# Patient Record
Sex: Male | Born: 1955 | Race: White | Hispanic: No | Marital: Married | Smoking: Never smoker
Health system: Southern US, Community
[De-identification: ages and names within clinical notes are randomized; demographics above are authoritative.]

## PROBLEM LIST (undated history)

## (undated) DIAGNOSIS — C801 Malignant (primary) neoplasm, unspecified: Secondary | ICD-10-CM

## (undated) DIAGNOSIS — M1990 Unspecified osteoarthritis, unspecified site: Secondary | ICD-10-CM

## (undated) DIAGNOSIS — E785 Hyperlipidemia, unspecified: Secondary | ICD-10-CM

## (undated) DIAGNOSIS — M545 Low back pain, unspecified: Secondary | ICD-10-CM

## (undated) DIAGNOSIS — I10 Essential (primary) hypertension: Secondary | ICD-10-CM

## (undated) HISTORY — PX: OTHER SURGICAL HISTORY: SHX169

## (undated) HISTORY — PX: MOHS SURGERY: SUR867

## (undated) HISTORY — DX: Low back pain, unspecified: M54.50

## (undated) HISTORY — PX: COLONOSCOPY: SHX174

## (undated) HISTORY — DX: Malignant (primary) neoplasm, unspecified: C80.1

## (undated) HISTORY — PX: INJECTION KNEE: SHX2446

## (undated) HISTORY — DX: Hyperlipidemia, unspecified: E78.5

## (undated) HISTORY — PX: POLYPECTOMY: SHX149

## (undated) HISTORY — PX: HERNIA REPAIR: SHX51

## (undated) HISTORY — PX: LUMBAR FUSION: SHX111

## (undated) HISTORY — DX: Unspecified osteoarthritis, unspecified site: M19.90

## (undated) HISTORY — DX: Low back pain: M54.5

## (undated) HISTORY — DX: Essential (primary) hypertension: I10

## (undated) HISTORY — PX: VASECTOMY: SHX75

---

## 1998-04-07 ENCOUNTER — Ambulatory Visit (HOSPITAL_BASED_OUTPATIENT_CLINIC_OR_DEPARTMENT_OTHER): Admission: RE | Admit: 1998-04-07 | Discharge: 1998-04-07 | Payer: Self-pay | Admitting: Orthopedic Surgery

## 2001-02-11 ENCOUNTER — Encounter (INDEPENDENT_AMBULATORY_CARE_PROVIDER_SITE_OTHER): Payer: Self-pay | Admitting: Specialist

## 2001-02-11 ENCOUNTER — Other Ambulatory Visit: Admission: RE | Admit: 2001-02-11 | Discharge: 2001-02-11 | Payer: Self-pay | Admitting: Urology

## 2004-06-16 ENCOUNTER — Ambulatory Visit: Payer: Self-pay | Admitting: Internal Medicine

## 2004-06-21 ENCOUNTER — Ambulatory Visit: Payer: Self-pay | Admitting: Internal Medicine

## 2004-08-30 ENCOUNTER — Ambulatory Visit: Payer: Self-pay | Admitting: Internal Medicine

## 2004-09-08 ENCOUNTER — Ambulatory Visit: Payer: Self-pay | Admitting: Internal Medicine

## 2005-03-09 ENCOUNTER — Ambulatory Visit: Payer: Self-pay | Admitting: Internal Medicine

## 2005-04-06 ENCOUNTER — Ambulatory Visit: Payer: Self-pay | Admitting: Internal Medicine

## 2005-04-19 ENCOUNTER — Encounter: Admission: RE | Admit: 2005-04-19 | Discharge: 2005-04-19 | Payer: Self-pay | Admitting: Neurosurgery

## 2005-05-23 ENCOUNTER — Inpatient Hospital Stay (HOSPITAL_COMMUNITY): Admission: RE | Admit: 2005-05-23 | Discharge: 2005-05-28 | Payer: Self-pay | Admitting: Neurosurgery

## 2005-06-29 ENCOUNTER — Encounter: Admission: RE | Admit: 2005-06-29 | Discharge: 2005-06-29 | Payer: Self-pay | Admitting: Neurosurgery

## 2005-07-06 ENCOUNTER — Ambulatory Visit: Payer: Self-pay | Admitting: Internal Medicine

## 2005-08-30 ENCOUNTER — Encounter
Admission: RE | Admit: 2005-08-30 | Discharge: 2005-08-30 | Payer: Self-pay | Admitting: Physical Medicine and Rehabilitation

## 2005-09-20 ENCOUNTER — Ambulatory Visit: Payer: Self-pay | Admitting: Internal Medicine

## 2005-10-25 ENCOUNTER — Ambulatory Visit: Payer: Self-pay | Admitting: Internal Medicine

## 2006-01-04 ENCOUNTER — Ambulatory Visit: Payer: Self-pay | Admitting: Internal Medicine

## 2006-04-13 ENCOUNTER — Ambulatory Visit: Payer: Self-pay | Admitting: Internal Medicine

## 2006-04-20 ENCOUNTER — Ambulatory Visit: Payer: Self-pay | Admitting: Internal Medicine

## 2006-08-21 ENCOUNTER — Ambulatory Visit: Payer: Self-pay | Admitting: Internal Medicine

## 2006-08-21 LAB — CONVERTED CEMR LAB
ALT: 69 units/L — ABNORMAL HIGH (ref 0–40)
AST: 36 units/L (ref 0–37)
Albumin: 3.6 g/dL (ref 3.5–5.2)
Alkaline Phosphatase: 33 units/L — ABNORMAL LOW (ref 39–117)
BUN: 18 mg/dL (ref 6–23)
Basophils Absolute: 0 10*3/uL (ref 0.0–0.1)
Basophils Relative: 0.4 % (ref 0.0–1.0)
CO2: 30 meq/L (ref 19–32)
Calcium: 9.3 mg/dL (ref 8.4–10.5)
Chloride: 107 meq/L (ref 96–112)
Cholesterol: 206 mg/dL (ref 0–200)
Creatinine, Ser: 1.2 mg/dL (ref 0.4–1.5)
Direct LDL: 110 mg/dL
Eosinophils Absolute: 0.1 10*3/uL (ref 0.0–0.6)
Eosinophils Relative: 2.5 % (ref 0.0–5.0)
GFR calc Af Amer: 83 mL/min
GFR calc non Af Amer: 68 mL/min
Glucose, Bld: 111 mg/dL — ABNORMAL HIGH (ref 70–99)
HCT: 42.8 % (ref 39.0–52.0)
HDL: 44.4 mg/dL (ref >39.0)
Hemoglobin: 15.1 g/dL (ref 13.0–17.0)
Hgb A1c MFr Bld: 5.9 % (ref 4.6–6.0)
Lymphocytes Relative: 26.5 % (ref 12.0–46.0)
MCHC: 35.4 g/dL (ref 30.0–36.0)
MCV: 91.2 fL (ref 78.0–100.0)
Monocytes Absolute: 0.6 10*3/uL (ref 0.2–0.7)
Monocytes Relative: 10.2 % (ref 3.0–11.0)
Neutro Abs: 3.3 10*3/uL (ref 1.4–7.7)
Neutrophils Relative %: 60.4 % (ref 43.0–77.0)
PSA: 1.13 ng/mL (ref 0.10–4.00)
Platelets: 201 10*3/uL (ref 150–400)
Potassium: 4.9 meq/L (ref 3.5–5.1)
RBC: 4.69 M/uL (ref 4.22–5.81)
RDW: 12.5 % (ref 11.5–14.6)
Sodium: 141 meq/L (ref 135–145)
TSH: 5.16 microintl units/mL (ref 0.35–5.50)
Total Bilirubin: 1.3 mg/dL — ABNORMAL HIGH (ref 0.3–1.2)
Total CHOL/HDL Ratio: 4.6
Total Protein: 6.1 g/dL (ref 6.0–8.3)
Triglycerides: 229 mg/dL (ref 0–149)
VLDL: 46 mg/dL — ABNORMAL HIGH (ref 0–40)
WBC: 5.4 10*3/uL (ref 4.5–10.5)

## 2006-08-29 ENCOUNTER — Ambulatory Visit: Payer: Self-pay | Admitting: Internal Medicine

## 2006-11-28 ENCOUNTER — Ambulatory Visit: Payer: Self-pay | Admitting: Internal Medicine

## 2006-11-28 LAB — CONVERTED CEMR LAB
ALT: 86 units/L — ABNORMAL HIGH (ref 0–40)
AST: 48 units/L — ABNORMAL HIGH (ref 0–37)
Albumin: 3.9 g/dL (ref 3.5–5.2)
Alkaline Phosphatase: 40 units/L (ref 39–117)
Bilirubin, Direct: 0.1 mg/dL (ref 0.0–0.3)
Cholesterol: 238 mg/dL (ref 0–200)
Direct LDL: 127.4 mg/dL
HDL: 44 mg/dL (ref >39.0)
Total Bilirubin: 1 mg/dL (ref 0.3–1.2)
Total CHOL/HDL Ratio: 5.4
Total Protein: 6.9 g/dL (ref 6.0–8.3)
Triglycerides: 254 mg/dL (ref 0–149)
VLDL: 51 mg/dL — ABNORMAL HIGH (ref 0–40)

## 2006-12-06 ENCOUNTER — Ambulatory Visit: Payer: Self-pay | Admitting: Internal Medicine

## 2007-03-05 DIAGNOSIS — E785 Hyperlipidemia, unspecified: Secondary | ICD-10-CM | POA: Insufficient documentation

## 2007-03-12 ENCOUNTER — Ambulatory Visit: Payer: Self-pay | Admitting: Internal Medicine

## 2007-03-12 LAB — CONVERTED CEMR LAB
ALT: 39 U/L
AST: 25 U/L
Albumin: 3.8 g/dL
Alkaline Phosphatase: 37 U/L — ABNORMAL LOW
Bilirubin, Direct: 0.2 mg/dL
Cholesterol: 214 mg/dL
Direct LDL: 133 mg/dL
HDL: 43.8 mg/dL
Total Bilirubin: 1.1 mg/dL
Total CHOL/HDL Ratio: 4.9
Total Protein: 6.1 g/dL
Triglycerides: 169 mg/dL — ABNORMAL HIGH
VLDL: 34 mg/dL

## 2007-03-21 ENCOUNTER — Ambulatory Visit: Payer: Self-pay | Admitting: Internal Medicine

## 2007-03-21 DIAGNOSIS — I10 Essential (primary) hypertension: Secondary | ICD-10-CM | POA: Insufficient documentation

## 2007-03-21 DIAGNOSIS — M545 Low back pain, unspecified: Secondary | ICD-10-CM | POA: Insufficient documentation

## 2007-07-17 IMAGING — CT CT L SPINE W/O CM
2 of 6 series · 6 of 20 positions shown, 8 images · IV contrast (agent unspecified)
Comparison: none

CLINICAL DATA: Lumbar spondylosis.  Intermittent back pain.  No recent trauma.
CT LUMBAR SPINE WITHOUT CONTRAST:
Multidetector helical scans through the lumbar spine were performed in the axial plane.  In addition to the axial images, sagittal and coronal images were reconstructed and reviewed. 
At L1-2, there is a small anterior osteophyte present.  There is no evidence of central or foraminal narrowing at this level.
At L2-3, minimal annular bulging is seen and there is mild degenerative change involving the facet joints.
At L3-4, again minimal annular bulging and mild facet joint arthropathy is noted.
At L4-5, there is mild central and lateral stenosis which is multifactorial resulting from annular bulging as well as facet joint arthropathy and hypertrophy of the ligamentum flavum.
At L5-S1, there is degenerative disk disease with loss of disk space and spurring with mild sclerosis.  Minimal annular bulging is present.  There is some foraminal narrowing at L5-S1 bilaterally which is also multifactorial.

[Series 102: l-spine helical · axial · 0.27mm/px · z∈[-309,-158]mm · 5 of 380 slices shown, 7 images]
[im 64/380  soft-tissue]
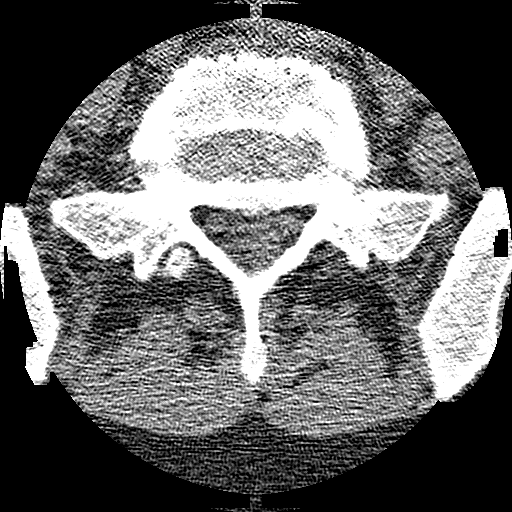
[im 64/380  bone]
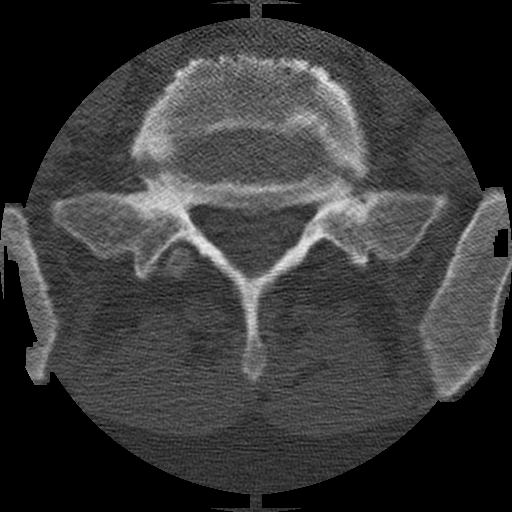
[im 127/380  bone]
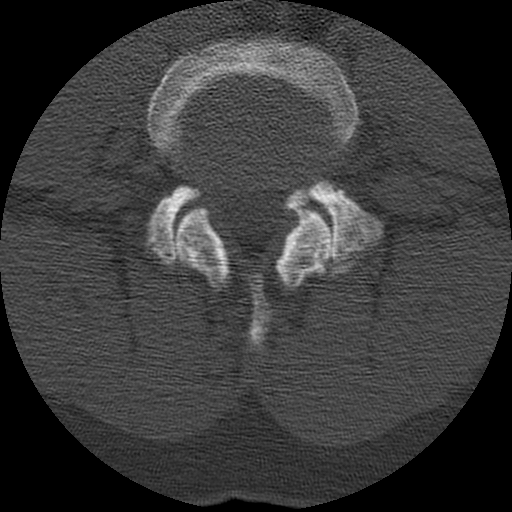
[im 190/380  bone]
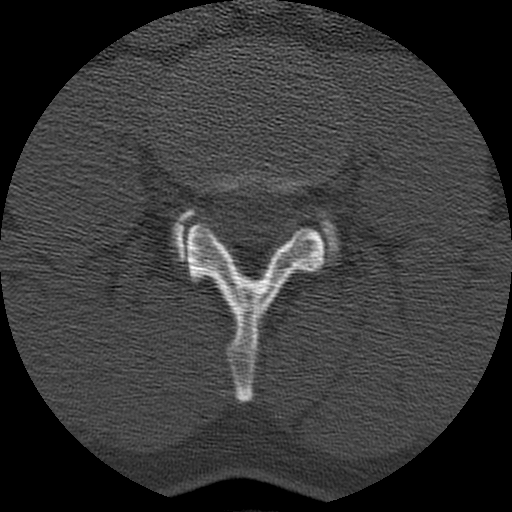
[im 253/380  bone]
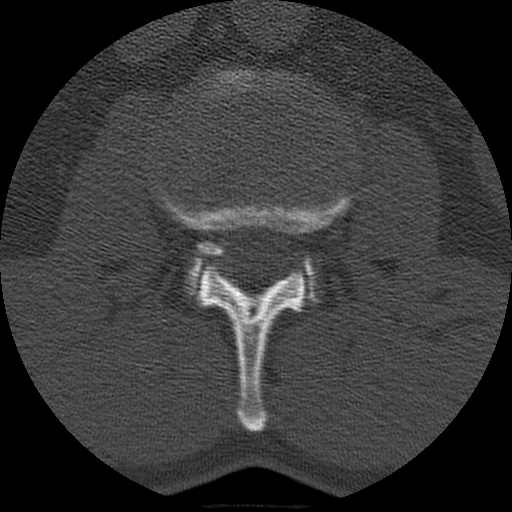
[im 316/380  soft-tissue]
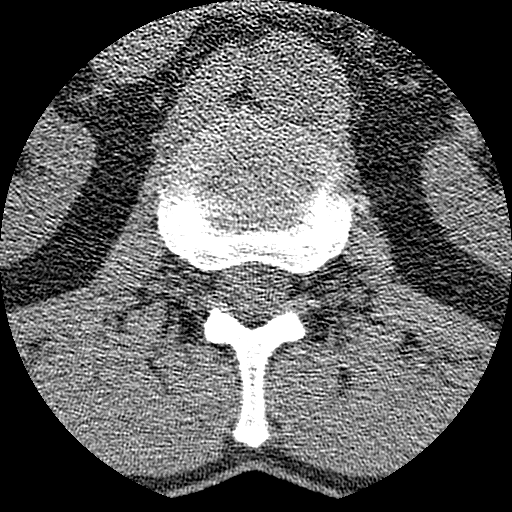
[im 316/380  bone]
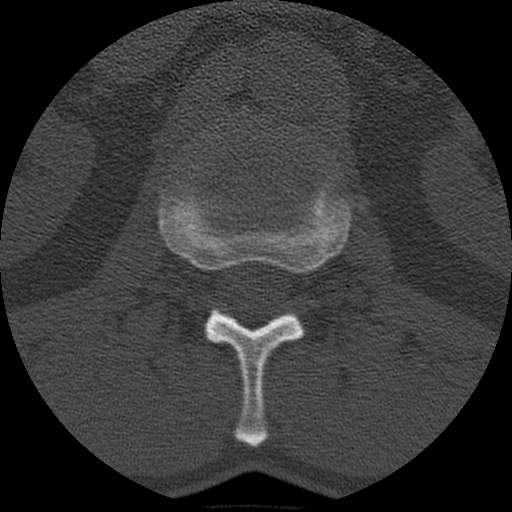

[Series 268: reformatted · sagittal · 0.45mm/px · 1 of 40 slices shown]
[im 20/40  bone]
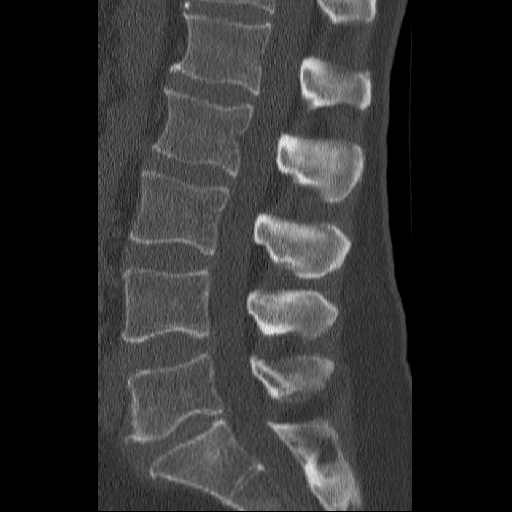

[6 of 20 positions shown; findings below may reference images not displayed]

IMPRESSION: 1.  Mild central and lateral recess stenosis at L4-5 which is multifactorial.
2.  Degenerative disk disease at L5-S1 with bilateral foraminal narrowing as well.
3.  Diffuse degenerative change involving the facet joints.
4.  Normal alignment.

## 2007-08-05 ENCOUNTER — Telehealth: Payer: Self-pay | Admitting: Internal Medicine

## 2007-08-26 ENCOUNTER — Encounter: Payer: Self-pay | Admitting: Internal Medicine

## 2008-04-13 ENCOUNTER — Telehealth: Payer: Self-pay | Admitting: Internal Medicine

## 2008-05-14 ENCOUNTER — Ambulatory Visit: Payer: Self-pay | Admitting: Internal Medicine

## 2008-05-14 LAB — CONVERTED CEMR LAB
ALT: 61 units/L — ABNORMAL HIGH (ref 0–53)
AST: 35 units/L (ref 0–37)
Albumin: 3.9 g/dL (ref 3.5–5.2)
Alkaline Phosphatase: 36 units/L — ABNORMAL LOW (ref 39–117)
BUN: 18 mg/dL (ref 6–23)
Basophils Absolute: 0 10*3/uL (ref 0.0–0.1)
Basophils Relative: 0.2 % (ref 0.0–3.0)
Bilirubin Urine: NEGATIVE
Bilirubin, Direct: 0.1 mg/dL (ref 0.0–0.3)
CO2: 31 meq/L (ref 19–32)
Calcium: 9.5 mg/dL (ref 8.4–10.5)
Chloride: 104 meq/L (ref 96–112)
Cholesterol: 222 mg/dL (ref 0–200)
Creatinine, Ser: 1 mg/dL (ref 0.4–1.5)
Direct LDL: 137.4 mg/dL
Eosinophils Absolute: 0.1 10*3/uL (ref 0.0–0.7)
Eosinophils Relative: 1.6 % (ref 0.0–5.0)
GFR calc Af Amer: 101 mL/min
GFR calc non Af Amer: 84 mL/min
Glucose, Bld: 109 mg/dL — ABNORMAL HIGH (ref 70–99)
Glucose, Urine, Semiquant: NEGATIVE
HCT: 46.2 % (ref 39.0–52.0)
HDL: 42.9 mg/dL (ref >39.0)
Hemoglobin: 16.1 g/dL (ref 13.0–17.0)
Ketones, urine, test strip: NEGATIVE
Lymphocytes Relative: 25.7 % (ref 12.0–46.0)
MCHC: 34.8 g/dL (ref 30.0–36.0)
MCV: 92.8 fL (ref 78.0–100.0)
Monocytes Absolute: 0.5 10*3/uL (ref 0.1–1.0)
Monocytes Relative: 9.9 % (ref 3.0–12.0)
Neutro Abs: 3.5 10*3/uL (ref 1.4–7.7)
Neutrophils Relative %: 62.6 % (ref 43.0–77.0)
Nitrite: NEGATIVE
PSA: 0.82 ng/mL (ref 0.10–4.00)
Platelets: 188 10*3/uL (ref 150–400)
Potassium: 4.5 meq/L (ref 3.5–5.1)
RBC: 4.98 M/uL (ref 4.22–5.81)
RDW: 12.4 % (ref 11.5–14.6)
Sodium: 141 meq/L (ref 135–145)
Specific Gravity, Urine: 1.025
TSH: 2.8 microintl units/mL (ref 0.35–5.50)
Total Bilirubin: 1.2 mg/dL (ref 0.3–1.2)
Total CHOL/HDL Ratio: 5.2
Total Protein: 6.6 g/dL (ref 6.0–8.3)
Triglycerides: 125 mg/dL (ref 0–149)
Urobilinogen, UA: 0.2
VLDL: 25 mg/dL (ref 0–40)
WBC Urine, dipstick: NEGATIVE
WBC: 5.5 10*3/uL (ref 4.5–10.5)
pH: 6.5

## 2008-05-21 ENCOUNTER — Ambulatory Visit: Payer: Self-pay | Admitting: Internal Medicine

## 2008-08-03 ENCOUNTER — Encounter: Payer: Self-pay | Admitting: Internal Medicine

## 2008-08-03 ENCOUNTER — Ambulatory Visit: Payer: Self-pay | Admitting: Vascular Surgery

## 2008-08-03 ENCOUNTER — Ambulatory Visit (HOSPITAL_COMMUNITY): Admission: AD | Admit: 2008-08-03 | Discharge: 2008-08-03 | Payer: Self-pay | Admitting: Orthopedic Surgery

## 2008-08-03 ENCOUNTER — Encounter (INDEPENDENT_AMBULATORY_CARE_PROVIDER_SITE_OTHER): Payer: Self-pay | Admitting: Orthopedic Surgery

## 2009-01-18 ENCOUNTER — Telehealth: Payer: Self-pay | Admitting: Internal Medicine

## 2009-01-25 ENCOUNTER — Ambulatory Visit: Payer: Self-pay

## 2009-01-25 ENCOUNTER — Encounter: Payer: Self-pay | Admitting: Internal Medicine

## 2009-01-25 DIAGNOSIS — M79609 Pain in unspecified limb: Secondary | ICD-10-CM | POA: Insufficient documentation

## 2009-03-12 ENCOUNTER — Telehealth: Payer: Self-pay | Admitting: Internal Medicine

## 2009-03-25 ENCOUNTER — Ambulatory Visit: Payer: Self-pay | Admitting: Family Medicine

## 2009-03-25 DIAGNOSIS — J310 Chronic rhinitis: Secondary | ICD-10-CM | POA: Insufficient documentation

## 2009-04-06 ENCOUNTER — Telehealth: Payer: Self-pay | Admitting: Family Medicine

## 2009-04-09 ENCOUNTER — Ambulatory Visit: Payer: Self-pay | Admitting: Internal Medicine

## 2009-04-09 LAB — CONVERTED CEMR LAB
ALT: 59 units/L — ABNORMAL HIGH (ref 0–53)
AST: 34 units/L (ref 0–37)
Albumin: 3.8 g/dL (ref 3.5–5.2)
Alkaline Phosphatase: 37 units/L — ABNORMAL LOW (ref 39–117)
Bilirubin, Direct: 0.1 mg/dL (ref 0.0–0.3)
Cholesterol: 263 mg/dL — ABNORMAL HIGH (ref 0–200)
Direct LDL: 175.3 mg/dL
HDL: 47.5 mg/dL (ref >39.00)
Total Bilirubin: 1.2 mg/dL (ref 0.3–1.2)
Total CHOL/HDL Ratio: 6
Total Protein: 6.7 g/dL (ref 6.0–8.3)
Triglycerides: 213 mg/dL — ABNORMAL HIGH (ref 0.0–149.0)
VLDL: 42.6 mg/dL — ABNORMAL HIGH (ref 0.0–40.0)

## 2009-04-19 ENCOUNTER — Ambulatory Visit: Payer: Self-pay | Admitting: Internal Medicine

## 2009-06-23 ENCOUNTER — Ambulatory Visit: Payer: Self-pay | Admitting: Internal Medicine

## 2009-06-23 DIAGNOSIS — J0100 Acute maxillary sinusitis, unspecified: Secondary | ICD-10-CM | POA: Insufficient documentation

## 2010-06-21 ENCOUNTER — Telehealth: Payer: Self-pay | Admitting: Internal Medicine

## 2010-07-04 ENCOUNTER — Ambulatory Visit: Payer: Self-pay | Admitting: Internal Medicine

## 2010-07-04 LAB — CONVERTED CEMR LAB
ALT: 41 units/L (ref 0–53)
AST: 29 units/L (ref 0–37)
Albumin: 3.7 g/dL (ref 3.5–5.2)
Alkaline Phosphatase: 42 units/L (ref 39–117)
BUN: 13 mg/dL (ref 6–23)
Basophils Absolute: 0 10*3/uL (ref 0.0–0.1)
Basophils Relative: 0.7 % (ref 0.0–3.0)
Bilirubin Urine: NEGATIVE
Bilirubin, Direct: 0.1 mg/dL (ref 0.0–0.3)
CO2: 29 meq/L (ref 19–32)
Calcium: 9.1 mg/dL (ref 8.4–10.5)
Chloride: 100 meq/L (ref 96–112)
Cholesterol: 202 mg/dL — ABNORMAL HIGH (ref 0–200)
Creatinine, Ser: 1.1 mg/dL (ref 0.4–1.5)
Direct LDL: 115 mg/dL
Eosinophils Absolute: 0.3 10*3/uL (ref 0.0–0.7)
Eosinophils Relative: 5.9 % — ABNORMAL HIGH (ref 0.0–5.0)
GFR calc non Af Amer: 77.54 mL/min (ref >60.00)
Glucose, Bld: 111 mg/dL — ABNORMAL HIGH (ref 70–99)
HCT: 43.2 % (ref 39.0–52.0)
HDL: 44.2 mg/dL (ref >39.00)
Hemoglobin, Urine: NEGATIVE
Hemoglobin: 15 g/dL (ref 13.0–17.0)
Ketones, ur: NEGATIVE mg/dL
Leukocytes, UA: NEGATIVE
Lymphocytes Relative: 26 % (ref 12.0–46.0)
Lymphs Abs: 1.5 10*3/uL (ref 0.7–4.0)
MCHC: 34.8 g/dL (ref 30.0–36.0)
MCV: 92.2 fL (ref 78.0–100.0)
Monocytes Absolute: 0.6 10*3/uL (ref 0.1–1.0)
Monocytes Relative: 10.3 % (ref 3.0–12.0)
Neutro Abs: 3.3 10*3/uL (ref 1.4–7.7)
Neutrophils Relative %: 57.1 % (ref 43.0–77.0)
Nitrite: NEGATIVE
PSA: 0.61 ng/mL (ref 0.10–4.00)
Platelets: 184 10*3/uL (ref 150.0–400.0)
Potassium: 4.4 meq/L (ref 3.5–5.1)
RBC: 4.69 M/uL (ref 4.22–5.81)
RDW: 13 % (ref 11.5–14.6)
Sodium: 136 meq/L (ref 135–145)
Specific Gravity, Urine: 1.02 (ref 1.000–1.030)
TSH: 3.39 microintl units/mL (ref 0.35–5.50)
Total Bilirubin: 0.8 mg/dL (ref 0.3–1.2)
Total CHOL/HDL Ratio: 5
Total Protein, Urine: 30 mg/dL
Total Protein: 6 g/dL (ref 6.0–8.3)
Triglycerides: 233 mg/dL — ABNORMAL HIGH (ref 0.0–149.0)
Urine Glucose: NEGATIVE mg/dL
Urobilinogen, UA: 0.2 (ref 0.0–1.0)
VLDL: 46.6 mg/dL — ABNORMAL HIGH (ref 0.0–40.0)
WBC: 5.8 10*3/uL (ref 4.5–10.5)
pH: 7 (ref 5.0–8.0)

## 2010-07-18 ENCOUNTER — Ambulatory Visit: Payer: Self-pay | Admitting: Internal Medicine

## 2010-07-18 DIAGNOSIS — E8881 Metabolic syndrome: Secondary | ICD-10-CM | POA: Insufficient documentation

## 2010-07-18 DIAGNOSIS — H900 Conductive hearing loss, bilateral: Secondary | ICD-10-CM | POA: Insufficient documentation

## 2010-07-18 DIAGNOSIS — L570 Actinic keratosis: Secondary | ICD-10-CM | POA: Insufficient documentation

## 2010-07-28 ENCOUNTER — Encounter: Payer: Self-pay | Admitting: Internal Medicine

## 2010-08-09 ENCOUNTER — Encounter (INDEPENDENT_AMBULATORY_CARE_PROVIDER_SITE_OTHER): Payer: Self-pay | Admitting: *Deleted

## 2010-08-12 ENCOUNTER — Encounter (INDEPENDENT_AMBULATORY_CARE_PROVIDER_SITE_OTHER): Payer: Self-pay | Admitting: *Deleted

## 2010-08-17 ENCOUNTER — Ambulatory Visit
Admission: RE | Admit: 2010-08-17 | Discharge: 2010-08-17 | Payer: Self-pay | Source: Home / Self Care | Attending: Gastroenterology | Admitting: Gastroenterology

## 2010-08-29 ENCOUNTER — Other Ambulatory Visit: Payer: Self-pay | Admitting: Gastroenterology

## 2010-08-29 ENCOUNTER — Ambulatory Visit
Admission: RE | Admit: 2010-08-29 | Discharge: 2010-08-29 | Payer: Self-pay | Source: Home / Self Care | Attending: Gastroenterology | Admitting: Gastroenterology

## 2010-08-29 DIAGNOSIS — D126 Benign neoplasm of colon, unspecified: Secondary | ICD-10-CM

## 2010-08-29 LAB — HM COLONOSCOPY

## 2010-08-30 NOTE — Progress Notes (Signed)
Summary: REQ FOR CPX APPT (Before end of year?)  Phone Note Call from Patient   Caller: Patient Complaint: Urinary/GYN Problems Summary of Call: Pt called to advise that he needs a cpx scheduled before the end of the year.... Pt adv that if Dr Lovell Sheehan can't work him in, can he schedule cpx with another physician.... has to have same done by end of year for insurance purposes.  Pt can be reached at [phone].  Initial call taken by: Debbra Riding,  June 21, 2010 9:35 AM  Follow-up for Phone Call        cpx sch for 07-11-2010 345pm Follow-up by: Heron Sabins,  June 21, 2010 11:44 AM

## 2010-09-01 NOTE — Assessment & Plan Note (Signed)
Summary: cpx/njr/PT [facility] FROM BMP/CJR/PT [facility] FROM BMP/CJR   Vital Signs:  Patient profile:   55 year old male Height:      75 inches Weight:      272 pounds BMI:     34.12 Temp:     98.2 degrees F oral Pulse rate:   80 / minute Resp:     14 per minute BP sitting:   134 / 86  (left arm)  Vitals Entered By: Willy Eddy, LPN (July 18, 2010 4:10 PM)  Contraindications/Deferment of Procedures/Staging:    Test/Procedure: FLU VAX    Reason for deferment: patient declined  CC: wellness exam--no colonoscopy-  order in computer, Hypertension Management Is Patient Diabetic? No   Primary Care Provider:  Stacie Glaze MD  CC:  wellness exam--no colonoscopy-  order in computer and Hypertension Management.  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        Positive major cardiovascular risk factors include male age 51 years old or older, hyperlipidemia, and hypertension.  Negative major cardiovascular risk factors include non-tobacco-user status.     Preventive Screening-Counseling & Management  Alcohol-Tobacco     Smoking Status: quit  Problems Prior to Update: 1)  Acute Maxillary Sinusitis  (ICD-461.0) 2)  Rhinitis  (ICD-472.0) 3)  Foot Pain  (ICD-729.5) 4)  Preventive Health Care  (ICD-V70.0) 5)  Hypertension  (ICD-401.9) 6)  Low Back Pain  (ICD-724.2) 7)  Hyperlipidemia  (ICD-272.4)  Current Problems (verified): 1)  Acute Maxillary Sinusitis  (ICD-461.0) 2)  Rhinitis  (ICD-472.0) 3)  Foot Pain  (ICD-729.5) 4)  Preventive Health Care  (ICD-V70.0) 5)  Hypertension  (ICD-401.9) 6)  Low Back Pain   (ICD-724.2) 7)  Hyperlipidemia  (ICD-272.4)  Medications Prior to Update: 1)  Micardis 80 Mg Tabs (Telmisartan) .Marland Kitchen.. 1 Once Daily 2)  Vytorin 10-40 Mg Tabs (Ezetimibe-Simvastatin) .... One By Mouth Daily 3)  Vicodin 5-500 Mg  Tabs (Hydrocodone-Acetaminophen) .... One By Mouth Q 6 Hours Prn 4)  Vitamin D3 1000 Unit Caps (Cholecalciferol) .... Two By Mouth Daily 5)  Mobic 15 Mg Tabs (Meloxicam) .Marland Kitchen.. 1 Once Daily 6)  Nasonex 50 Mcg/act Susp (Mometasone Furoate) .... 2 Sprays in Each Nostrile Once Daily 7)  Hydrocodone-Acetaminophen 5-500 Mg Tabs (Hydrocodone-Acetaminophen) .... One By Mouth Three Times A Day Prn 8)  Levaquin 500 Mg Tabs (Levofloxacin) .... One By Mouth Daily For 7 Days 9)  Chlorphen-Pseudoeph-Methscop 8-120-2.5 Mg Xr12h-Tab (Chlorphen-Pseudoeph-Methscop) .... One By Mouth Two Times A Day For 10 Days 10)  Omnaris 50 Mcg/act Susp (Ciclesonide) .... Use As Directed  Current Medications (verified): 1)  Micardis 80 Mg Tabs (Telmisartan) .Marland Kitchen.. 1 Once Daily 2)  Vytorin 10-40 Mg Tabs (Ezetimibe-Simvastatin) .... One By Mouth Daily 3)  Vitamin D3 1000 Unit Caps (Cholecalciferol) .... Two By Mouth Daily 4)  Mobic 15 Mg Tabs (Meloxicam) .Marland Kitchen.. 1 Once Daily As Needed 5)  Nasonex 50 Mcg/act Susp (Mometasone Furoate) .... 2 Sprays in Each Nostrile Once Daily  Allergies (verified): 1)  ! Celebrex 2)  ! Hydrochlorothiazide 3)  ! Nsaids 4)  ! Percocet 5)  ! Lyrica (Pregabalin)  Past History:  Family History: Last updated: 03/05/2007 Family  History Hypertension Family History of Cardiovascular disorder Family History of Aneurysm Aortic  Social History: Last updated: 03/05/2007 Married Alcohol use-yes Former Smoker  Risk Factors: Smoking Status: quit (07/18/2010)  Past medical, surgical, family and social histories (including risk factors) reviewed, and no changes noted (except as noted below).  Past Medical History: Reviewed history from 03/21/2007 and no changes  required. Hyperlipidemia Low back pain Hypertension   Past Surgical History: Reviewed history from 05/21/2008 and no changes required. Vasectomy Hernia Repair Radio Keratotomy Lumbar fusion  2006 knee arthroscopy  Family History: Reviewed history from 03/05/2007 and no changes required. Family History Hypertension Family History of Cardiovascular disorder Family History of Aneurysm Aortic  Social History: Reviewed history from 03/05/2007 and no changes required. Married Alcohol use-yes Former Smoker  Review of Systems  The patient denies anorexia, fever, weight loss, weight gain, vision loss, decreased hearing, hoarseness, chest pain, syncope, dyspnea on exertion, peripheral edema, prolonged cough, headaches, hemoptysis, abdominal pain, melena, hematochezia, severe indigestion/heartburn, hematuria, incontinence, genital sores, muscle weakness, suspicious skin lesions, transient blindness, difficulty walking, depression, unusual weight change, abnormal bleeding, enlarged lymph nodes, angioedema, breast masses, and testicular masses.    Physical Exam  General:  alert and overweight-appearing.   Head:  Normocephalic and atraumatic without obvious abnormalities. No apparent alopecia or balding. Eyes:  No corneal or conjunctival inflammation noted. EOMI. Perrla. Funduscopic exam benign, without hemorrhages, exudates or papilledema. Vision grossly normal. Ears:  R ear normal and L ear normal.   Nose:  mucosal erythema, mucosal edema, and airflow obstruction.   Mouth:  pharyngeal exudate, posterior lymphoid hypertrophy, and postnasal drip.   Neck:  No deformities, masses, or tenderness noted. Lungs:  normal respiratory effort and no wheezes.   Heart:  normal rate and regular rhythm.   Abdomen:  soft, non-tender, and normal bowel sounds.     Impression & Recommendations:  Problem # 1:  HYPERTENSION (ICD-401.9)  His updated medication list for this problem includes:    Micardis  80 Mg Tabs (Telmisartan) .Marland Kitchen... 1 once daily  BP today: 134/86 Prior BP: 136/80 (06/23/2009)  Prior 10 Yr Risk Heart Disease: Not enough information (05/21/2008)  Labs Reviewed: K+: 4.4 (07/04/2010) Creat: : 1.1 (07/04/2010)   Chol: 202 (07/04/2010)   HDL: 44.20 (07/04/2010)   LDL: DEL (05/14/2008)   TG: 233.0 (07/04/2010)  Problem # 2:  HYPERTENSION (ICD-401.9)  His updated medication list for this problem includes:    Micardis 80 Mg Tabs (Telmisartan) .Marland Kitchen... 1 once daily  BP today: 134/86 Prior BP: 136/80 (06/23/2009)  Prior 10 Yr Risk Heart Disease: Not enough information (05/21/2008)  Labs Reviewed: K+: 4.4 (07/04/2010) Creat: : 1.1 (07/04/2010)   Chol: 202 (07/04/2010)   HDL: 44.20 (07/04/2010)   LDL: DEL (05/14/2008)   TG: 233.0 (07/04/2010)  Problem # 3:  HYPERLIPIDEMIA (ICD-272.4)  His updated medication list for this problem includes:    Vytorin 10-40 Mg Tabs (Ezetimibe-simvastatin) ..... One by mouth daily  Labs Reviewed: SGOT: 29 (07/04/2010)   SGPT: 41 (07/04/2010)  Prior 10 Yr Risk Heart Disease: Not enough information (05/21/2008)   HDL:44.20 (07/04/2010), 47.50 (04/09/2009)  LDL:DEL (05/14/2008), DEL (03/12/2007)  Chol:202 (07/04/2010), 263 (04/09/2009)  Trig:233.0 (07/04/2010), 213.0 (04/09/2009)  Problem # 4:  PREVENTIVE HEALTH CARE (ICD-V70.0) The pt was asked about all immunizations, health maint. services that are appropriate to their age and was given guidance on diet exercize  and weight management  Orders: Gastroenterology Referral (GI)  Td Booster: Tdap (05/21/2008)   Chol: 202 (07/04/2010)  HDL: 44.20 (07/04/2010)   LDL: DEL (05/14/2008)   TG: 233.0 (07/04/2010) TSH: 3.39 (07/04/2010)   HgbA1C: 5.9 (08/21/2006)   PSA: 0.61 (07/04/2010)  Discussed using sunscreen, use of alcohol, drug use, self testicular exam, routine dental care, routine eye care, routine physical exam, seat belts, multiple vitamins, osteoporosis prevention, adequate calcium  intake in diet, and recommendations for immunizations.  Discussed exercise and checking cholesterol.  Discussed gun safety, safe sex, and contraception. Also recommend checking PSA.  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 The pt tolerated the procedure and post procedure care was discussed  Orders: Cryotherapy/Destruction benign or premalignant lesion (1st lesion)  (17000)  Complete Medication List: 1)  Micardis 80 Mg Tabs (Telmisartan) .Marland Kitchen.. 1 once daily 2)  Vytorin 10-40 Mg Tabs (Ezetimibe-simvastatin) .... One by mouth daily 3)  Vitamin D3 1000 Unit Caps (Cholecalciferol) .... Two by mouth daily 4)  Meloxicam 15 Mg Tabs (Meloxicam) .... One by mouth daily 5)  Nasonex 50 Mcg/act Susp (Mometasone furoate) .... 2 sprays in each nostrile once daily 6)  Hydrocodone-acetaminophen 5-500 Mg Tabs (Hydrocodone-acetaminophen) .... One by mouth three times a day as needed back pain  Other Orders: Audiology (Audio)  Hypertension Assessment/Plan:      The patient's hypertensive risk group is category B: At least one risk factor (excluding diabetes) with no target organ damage.  Today's blood pressure is 134/86.  His blood pressure goal is < 140/90.  Patient Instructions: 1)  Please schedule a follow-up appointment in 6 months. 2)  Hepatic Panel prior to visit, ICD-9:995.20 3)  Lipid Panel prior to visit, ICD-9:272.4 Prescriptions: HYDROCODONE-ACETAMINOPHEN 5-500 MG TABS (HYDROCODONE-ACETAMINOPHEN) one by mouth three times a day as needed BACK PAIN  #60 x 2   Entered and Authorized by:   Stacie Glaze MD   Signed by:   Stacie Glaze MD on 07/18/2010   Method used:   Print then Give to Patient   RxID:   [id number] MELOXICAM  15 MG TABS (MELOXICAM) one by mouth daily  #30 x 11   Entered and Authorized by:   Stacie Glaze MD   Signed by:   Stacie Glaze MD on 07/18/2010   Method used:   Electronically to        [facility] (retail)       [address]       [place], [place]  [id number]       Ph: [phone] or [phone]       Fax: [phone]   RxID:   [id number]    Orders Added: 1)  Gastroenterology Referral [GI] 2)  Audiology [Audio] 3)  Est. Patient 40-64 years [99396] 4)  Est. Patient Level III [95188] 5)  Cryotherapy/Destruction benign or premalignant lesion (1st lesion)  [17000]

## 2010-09-01 NOTE — Therapy (Signed)
Summary: Audiologic Evaluation/[facility]  Audiologic Evaluation/[facility]   Imported By: Maryln Gottron 08/19/2010 10:07:16  _____________________________________________________________________  External Attachment:    Type:   Image     Comment:   External Document

## 2010-09-01 NOTE — Letter (Signed)
Summary: [facility] Instructions  [facility]  [address] [place], [place] [address]   Phone: [phone]  Fax: [phone]       Paul Morrison    03/09/1956    MRN: [id number]        Procedure Day Dorna Bloom:  Duanne Limerick  08/29/10     Arrival Time: 10:30 a.m.     Procedure Time:11:30 a.m.     Location of Procedure:                    _ X_  [facility] (4th Floor)                      PREPARATION FOR COLONOSCOPY WITH MOVIPREP   Starting 5 days prior to your procedure 08/24/10 do not eat nuts, seeds, popcorn, corn, beans, peas,  salads, or any raw vegetables.  Do not take any fiber supplements (e.g. Metamucil, Citrucel, and Benefiber).  THE DAY BEFORE YOUR PROCEDURE         DATE: 08/28/10   DAY: SUNDAY  1.  Drink clear liquids the entire day-NO SOLID FOOD  2.  Do not drink anything colored red or purple.  Avoid juices with pulp.  No orange juice.  3.  Drink at least 64 oz. (8 glasses) of fluid/clear liquids during the day to prevent dehydration and help the prep work efficiently.  CLEAR LIQUIDS INCLUDE: Water Jello Ice Popsicles Tea (sugar ok, no milk/cream) Powdered fruit flavored drinks Coffee (sugar ok, no milk/cream) Gatorade Juice: apple, white grape, white cranberry  Lemonade Clear bullion, consomm, broth Carbonated beverages (any kind) Strained chicken noodle soup Hard Candy                             4.  In the morning, mix first dose of MoviPrep solution:    Empty 1 Pouch A and 1 Pouch B into the disposable container    Add lukewarm drinking water to the top line of the container. Mix to dissolve    Refrigerate (mixed solution should be used within 24 hrs)  5.  Begin drinking the prep at 5:00 p.m. The MoviPrep container is divided by 4 marks.   Every 15 minutes drink the solution down to the next mark (approximately 8 oz) until the full liter is complete.   6.  Follow completed prep with 16 oz of clear liquid of your choice  (Nothing red or purple).  Continue to drink clear liquids until bedtime.  7.  Before going to bed, mix second dose of MoviPrep solution:    Empty 1 Pouch A and 1 Pouch B into the disposable container    Add lukewarm drinking water to the top line of the container. Mix to dissolve    Refrigerate  THE DAY OF YOUR PROCEDURE      DATE: 08/29/10   DAY: MONDAY  Beginning at 6:00AM (5 hours before procedure):         1. Every 15 minutes, drink the solution down to the next mark (approx 8 oz) until the full liter is complete.  2. Follow completed prep with 16 oz. of clear liquid of your choice.    3. You may drink clear liquids until 9:00 A.M (2 HOURS BEFORE PROCEDURE).   MEDICATION INSTRUCTIONS  Unless otherwise instructed, you should take regular prescription medications with a small sip of water   as early as possible the morning  of your procedure.         OTHER INSTRUCTIONS  You will need a responsible adult at least 54 years of age to accompany you and drive you home.   This person must remain in the waiting room during your procedure.  Wear loose fitting clothing that is easily removed.  Leave jewelry and other valuables at home.  However, you may wish to bring a book to read or  an iPod/MP3 player to listen to music as you wait for your procedure to start.  Remove all body piercing jewelry and leave at home.  Total time from sign-in until discharge is approximately 2-3 hours.  You should go home directly after your procedure and rest.  You can resume normal activities the  day after your procedure.  The day of your procedure you should not:   Drive   Make legal decisions   Operate machinery   Drink alcohol   Return to work  You will receive specific instructions about eating, activities and medications before you leave.    The above instructions have been reviewed and explained to me by   Karl Bales RN  August 17, 2010 8:21 AM    I fully  understand and can verbalize these instructions _____________________________ Date _________

## 2010-09-01 NOTE — Miscellaneous (Signed)
Summary: [facility] previsit  Clinical Lists Changes  Medications: Added new medication of MOVIPREP 100 GM  SOLR (PEG-KCL-NACL-NASULF-NA ASC-C) As per prep instructions. - Signed Rx of MOVIPREP 100 GM  SOLR (PEG-KCL-NACL-NASULF-NA ASC-C) As per prep instructions.;  #1 x 0;  Signed;  Entered by: Karl Bales RN;  Authorized by: Mardella Layman MD [facility];  Method used: Electronically to [facility] [address], [place], [place]  [id number], Ph: [phone] or [phone], Fax: [phone]    Prescriptions: MOVIPREP 100 GM  SOLR (PEG-KCL-NACL-NASULF-NA ASC-C) As per prep instructions.  #1 x 0   Entered by:   Karl Bales RN   Authorized by:   Mardella Layman MD [facility]   Signed by:   Karl Bales RN on 08/17/2010   Method used:   Electronically to        [facility] (retail)       [address]       [place], [place]  [id number]       Ph: [phone] or [phone]       Fax: [phone]   RxID:   [id number]

## 2010-09-01 NOTE — Letter (Signed)
Summary: Pre Visit Letter Revised  [facility]  [address] [place], [place] [address]   Phone: [phone]  Fax: [phone]        08/09/2010 MRN: [id number] [facility] [address] [place], [place]  [address]             Procedure Date:  08/16/2010 @ 8:00   Direct colon-Dr. Jarold Motto   Welcome to the Gastroenterology Division at [facility].    You are scheduled to see a nurse for your pre-procedure visit on 08/29/2010 at 4:00 on the 3rd floor at [facility], [address]  We ask that you try to arrive at our office 15 minutes prior to your appointment time to allow for check-in.  Please take a minute to review the attached form.  If you answer "Yes" to one or more of the questions on the first page, we ask that you call the person listed at your earliest opportunity.  If you answer "No" to all of the questions, please complete the rest of the form and bring it to your appointment.    Your nurse visit will consist of discussing your medical and surgical history, your immediate family medical history, and your medications.   If you are unable to list all of your medications on the form, please bring the medication bottles to your appointment and we will list them.  We will need to be aware of both prescribed and over the counter drugs.  We will need to know exact dosage information as well.    Please be prepared to read and sign documents such as consent forms, a financial agreement, and acknowledgement forms.  If necessary, and with your consent, a friend or relative is welcome to sit-in on the nurse visit with you.  Please bring your insurance card so that we may make a copy of it.  If your insurance requires a referral to see a specialist, please bring your referral form from your primary care physician.  No co-pay is required for this nurse visit.     If you cannot keep your appointment, please call [phone] to cancel or reschedule  prior to your appointment date.  This allows [place] the opportunity to schedule an appointment for another patient in need of care.    Thank you for choosing [facility] for your medical needs.  We appreciate the opportunity to care for you.  Please visit [place] at our website  to learn more about our practice.  Sincerely, The Gastroenterology Division

## 2010-09-05 ENCOUNTER — Encounter: Payer: Self-pay | Admitting: Gastroenterology

## 2010-09-07 NOTE — Procedures (Addendum)
Summary: Colonoscopy  Patient: Paul Morrison Note: All result statuses are Final unless otherwise noted.  Tests: (1) Colonoscopy (COL)   COL Colonoscopy           DONE     [facility]     [address]     [place], [place]  [address]           COLONOSCOPY PROCEDURE REPORT           PATIENT:  [name], [name]  MR#:  [id number]     BIRTHDATE:  [birth year], 54 yrs. old  GENDER:  male     ENDOSCOPIST:  Vania Rea. Jarold Motto, MD, [facility]     REF. BY:  Stacie Glaze, M.D.     PROCEDURE DATE:  08/29/2010     PROCEDURE:  Colonoscopy with snare polypectomy     ASA CLASS:  Class II     INDICATIONS:  Routine Risk Screening     MEDICATIONS:   Fentanyl 75 mcg IV, Versed 8 mg IV           DESCRIPTION OF PROCEDURE:   After the risks benefits and     alternatives of the procedure were thoroughly explained, informed     consent was obtained.  Digital rectal exam was performed and     revealed no abnormalities.   The LB [id number] endoscope     was introduced through the anus and advanced to the cecum, which     was identified by both the appendix and ileocecal valve, without     limitations.  The quality of the prep was good, using MoviPrep.     The instrument was then slowly withdrawn as the colon was fully     examined.     <<PROCEDUREIMAGES>>           FINDINGS:  A pedunculated polyp was found in the sigmoid colon.     LARGE POLYP STALK INJECTED WITH 1CC OF 1/10,000 EPI AND SNARE     EXCISED.JAR #1.HEAD OF POLYP 2.5 CM.  Multiple polyps were found     in the sigmoid colon. SMALLER 1-5 MM FLAT SIGMOID POLYPS ALSO HOT     SNARE EXCISED.JAR #2.  External hemorrhoids were found.     Retroflexed views in the rectum revealed external hemorrhoids.     The scope was then withdrawn from the patient and the procedure     completed.           COMPLICATIONS:  None     ENDOSCOPIC IMPRESSION:     1) Pedunculated polyp in the sigmoid colon     2) Polyp, multiple in the sigmoid colon  3) External hemorrhoids     R/O CA IN LARGE 3 CM. POLYP     RECOMMENDATIONS:     1) Await biopsy results     F/U 1-3 YEARS.     REPEAT EXAM:  No           ______________________________     Vania Rea. Jarold Motto, MD, [facility]           CC:           n.     eSIGNED:   Vania Rea. Alix Lahmann at 08/29/2010 11:51 AM           Vincente Liberty, [id number]  Note: An exclamation mark (!) indicates a result that was not dispersed into the flowsheet. Document Creation Date: 08/29/2010 11:52 AM _______________________________________________________________________  (1) Order result status: Final Collection or  observation date-time: 08/29/2010 11:43 Requested date-time:  Receipt date-time:  Reported date-time:  Referring Physician:   Ordering Physician: Sheryn Bison [phone]) Specimen Source:  Source: Launa Grill Order Number: [id number] Lab site:   Appended Document: Colonoscopy recall 3 yrs     Procedures Next Due Date:    Colonoscopy: 08/2013

## 2010-09-15 NOTE — Letter (Signed)
Summary: Patient Notice- Polyp Results  [facility]  [address] [place], [place] [address]   Phone: [phone]  Fax: [phone]        September 05, 2010 MRN: [id number]    [facility] [address] [place], [place]  [address]    Dear Mr. Gloria,  I am pleased to inform you that the colon polyp(s) removed during your recent colonoscopy was (were) found to be benign (no cancer detected) upon pathologic examination.  I recommend you have a repeat colonoscopy examination in 3_ years to look for recurrent polyps, as having colon polyps increases your risk for having recurrent polyps or even colon cancer in the future.  Should you develop new or worsening symptoms of abdominal pain, bowel habit changes or bleeding from the rectum or bowels, please schedule an evaluation with either your primary care physician or with me.  Additional information/recommendations:  _xx_ No further action with gastroenterology is needed at this time. Please      follow-up with your primary care physician for your other healthcare      needs.  __ Please call [phone] to schedule a return visit to review your      situation.  __ Please keep your follow-up visit as already scheduled.  __ Continue treatment plan as outlined the day of your exam.  Please call us if you are having persistent problems or have questions about your condition that have not been fully answered at this time.  Sincerely,  Mardella Layman MD [facility]  This letter has been electronically signed by your physician.  Appended Document: Patient Notice- Polyp Results LETTER MAILED

## 2010-12-12 ENCOUNTER — Other Ambulatory Visit: Payer: Self-pay | Admitting: Internal Medicine

## 2010-12-16 NOTE — Discharge Summary (Signed)
NAMEBREANDAN, PEOPLE NO.:  [id number]   MEDICAL RECORD NO.:  [id number]          PATIENT TYPE:  INP   LOCATION:  3009                         FACILITY:  [facility]   PHYSICIAN:  Kathaleen Maser. Pool, M.D.    DATE OF BIRTH:  Dec 18, 1955   DATE OF ADMISSION:  05/23/2005  DATE OF DISCHARGE:  05/28/2005                                 DISCHARGE SUMMARY   FINAL DIAGNOSIS:  Lumbar vertebrae-4/5 and lumbar verterbrae-5/sacral  vertebrae-1 degenerative disease with stenosis.   HISTORY OF PRESENT ILLNESS:  Paul Morrison is a 55 year old male with history of  severe back and lower extremity pain failing conservative management. Workup  demonstrates evidence of marked disc degeneration at L4-5 and L5-S1 with  resulting stenosis. The patient presents now for two-level decompression and  fusion instrumentation.   HOSPITAL COURSE:  The patient had an uncomplicated two-level lumbar  decompression and fusion instrumentation performed. Postoperatively, the  patient awakened with intact neurological function. He had considerable back  pain which gradually eased over time. The patient was treated with physical  therapy and was gradually mobilized. At the time of discharge, the patient's  pain was well controlled. He had no radicular symptoms. His strength and  sensation were intact. His wounds were healing well.   CONDITION ON DISCHARGE:  Improved.           ______________________________  Kathaleen Maser Pool, M.D.     HAP/MEDQ  D:  07/18/2005  T:  07/19/2005  Job:  [id number]

## 2010-12-16 NOTE — Op Note (Signed)
NAMEVELMER, WOELFEL NO.:  [id number]   MEDICAL RECORD NO.:  [id number]          PATIENT TYPE:  INP   LOCATION:  2899                         FACILITY:  [facility]   PHYSICIAN:  Kathaleen Maser. Pool, M.D.    DATE OF BIRTH:  October 14, 1955   DATE OF PROCEDURE:  05/23/2005  DATE OF DISCHARGE:                                 OPERATIVE REPORT   SERVICE:  Neurosurgery.   PREOPERATIVE DIAGNOSES:  L4-L5 and L5-S1 degenerative disc disease with  instability and stenosis.   POSTOPERATIVE DIAGNOSIS:  L4-L5 and L5-S1 degenerative disc disease with  instability and stenosis.   PROCEDURE:  1.  L4-L5 and L5-S1 decompressive laminectomies with foraminotomies, more      than would be required for simple interbody fusion.  2.  L4-L5 and L5-S1 posterior lumbar interbody fusion utilizing Tangent      wedges, Telamon cages, and interbody autograft.  3.  Posterolateral arthrodesis from L4 to S1 using segmental pedicle screw      fixation and local autografting.   SURGEON:  Kathaleen Maser. Pool, M.D.   ASSISTANT:  Reinaldo Meeker, M.D.   ANESTHESIA:  General orotracheal.   INDICATIONS FOR PROCEDURE:  Mr. Argabright is a 55 year old male with a history of  severe back pain with bilateral lower extremity pain who has failed  conservative management.  Workup demonstrates evidence of marked  degenerative disc disease at L5-S1 and also degenerative disc disease at L4-  L5.  The patient had the unusual finding of a fracture of the superior facet  of L5 on the left side suggesting chronic instability.  The patient had  undergone diskography which gives a concordant pain response at both L4-L5  and L5-S1.  The patient also has co-existent foraminal stenosis bilaterally  at L4-L5 and L5-S1.  He presents now for decompression and fusion with  instrumentation in hopes of improving his symptoms.   DESCRIPTION OF PROCEDURE:  The patient was brought to the operating room and  placed on the table in a supine  position.  After general anesthesia was  achieved, the patient was positioned prone on the Wilson frame and properly  padded for operation in the lumbar region.  She was prepped and draped  sterilely.  A 10 blade was used to make a linear skin incision overlying the  L3, L4, L5, and S1 levels.  Subperiosteal dissection  extending from L3 down to the sacrum.  This was carried down sharply in the  midline.  Subperiosteal dissection was performed exposing the lamina and  facet joints of L3, L4, L5, and S1, as well as the transverse processes of  L4, L5, and the sacral ala bilaterally.  Deep self-retaining retractors were  placed.  Interoperative fluoroscopy was used and the levels were confirmed.  Decompressive laminectomy was then performed at L4, L5, and S1, removing the  entire lamina of L4, the entire lamina of L5, and the superior aspect of the  lamina of S1.  Inferior facetectomies of L4 and L5 were performed  bilaterally.  Superior facetectomies of L5 and S1 were performed  bilaterally.  All bone  was cleaned and used for later autografting.  Once  again, the findings of a fractured superior facet of L5 on the left was  confirmed.  A wide decompressive foraminotomy was then performed along the  course of the exiting L4, L5, and S1 nerve roots bilaterally.  The epidural  venous plexus was coagulated and cut.  Starting first at L4-L5, the thecal  sac and nerve root was protected.  The disc space was first incised on the  left side.  An aggressive discectomy was then performed using pituitary  rongeurs, up angled pituitary rongeurs, and Epstein curets.  The procedure  was then repeated on the contralateral side and then repeated bilaterally at  L5-S1.  With the disc space distracted at L4-L5 on the right side, the disc  space was then prepared for fusion utilizing 12 mm Tangent instruments.  The  box cutter was used first followed by the chisel followed by the round  scraper.  All soft  tissue was removed from the interspace.  A 12 by 26 mm  Telamon cage packed with morselized autograft mixed with demineralized bone  matrix was then impacted into place and recessed approximately 2 mm in the  posterior cortical margin.  Distractor was removed from the patient's right  side.  The thecal sac and nerve roots were protected on the right side.  Once again, the disc space was reamed and then cut.  The disc space was  further curettaged.  Morselized autograft was packed in the interspace.  A  26 mm Tangent allograft wedge was then impacted into place and recessed  approximately 2 mm in the posterior cortical margin.  The procedure was then  repeated at L5-S1, again, without complications, again using Telamon cages,  Tangent wedges, and local autograft, this time using 10 by 26 mm implants,  however.  The pedicles at L4, L5, and S1, were then identified using  superficial landmarks and interoperative fluoroscopy.  Each pedicle was then  entered by first making a pilot hole using the high speed drill.  Each pilot  hole was then probed using the high speed drill.  Each pilot hole was then  probed using the pedicle awl.  Each pedicle awl tract was probed and found  to be solidly in bone.  Each pedicle awl tract was then tapped with the 5.25  linear screw tap.  Each screw depth was probed and found to be solid in  bone.  A 6.75 by 15 mm spiral 90 screws were placed bilaterally at L4, 6.75  by 45 mm screws placed bilaterally at L5, and 6.75 by 35 mm on the left and  40 mm on the right were placed at S1.  The transverse process and sacral ala  were then decorticated using a high speed drill.  Morselized autograft was  packed posterolaterally for later fusion.  Short segments of titanium rods  were then contoured and placed through the screw heads at L4, L5, and S1.  The locking caps were then placed over the screw heads.  The locking caps were then engaged with the construct under  compression.  A transverse  connector was placed.  Final images revealed good position of the bone graft  and hardware. Gelfoam was placed topically for hemostasis.  A medium Hemovac  drain was left in the epidural space.  The wound was then closed in layers  with Vicryl sutures.  Steri-Strips and sterile dressing were applied.  There  were no apparent complications.  The  patient tolerated the procedure well  and he was returned to the recovery room in stable condition.           ______________________________  Kathaleen Maser Pool, M.D.     HAP/MEDQ  D:  05/23/2005  T:  05/23/2005  Job:  [id number]

## 2010-12-16 NOTE — Discharge Summary (Signed)
NAMEFINIAN, Paul Morrison NO.:  [id number]   MEDICAL RECORD NO.:  [id number]          PATIENT TYPE:  INP   LOCATION:  3009                         FACILITY:  [facility]   PHYSICIAN:  Kathaleen Maser. Pool, M.D.    DATE OF BIRTH:  06/24/1956   DATE OF ADMISSION:  05/23/2005  DATE OF DISCHARGE:  05/28/2005                                 DISCHARGE SUMMARY   FINAL DIAGNOSIS:  Lumbar vertebrae-4/5 and lumbar verterbrae-5/sacral  vertebrae-1 degenerative disc disease with stenosis and chronic pain.   HISTORY OF PRESENT ILLNESS:  [name] is a 55 year old male with history of  symptomatic disc degeneration and stenosis at L4-5 and L5-S1. The patient  presents now for two-level decompression and fusion of this space.   HOSPITAL COURSE:  The patient went to the operating room where an  uncomplicated L4-5 and L5-S1 decompression and fusion was performed. The  patient progressed slowly postoperatively secondary to issues with acute  postoperative pain control. His lower extremity symptoms were much improved  postoperatively; however, he had intense back pain following the surgery.  This gradually dissipated. At the time of discharge, the patient is  ambulating without difficulty. Straight sensation is intact. His wound is  well.   CONDITION ON DISCHARGE:  Improved.   DISCHARGE DISPOSITION:  The patient will follow-up in one week in my office.           ______________________________  Kathaleen Maser. Pool, M.D.     HAP/MEDQ  D:  08/08/2005  T:  08/08/2005  Job:  [id number]

## 2011-01-03 ENCOUNTER — Encounter: Payer: Self-pay | Admitting: Internal Medicine

## 2011-01-03 ENCOUNTER — Other Ambulatory Visit (INDEPENDENT_AMBULATORY_CARE_PROVIDER_SITE_OTHER): Payer: BC Managed Care – PPO

## 2011-01-03 ENCOUNTER — Other Ambulatory Visit: Payer: Self-pay | Admitting: Internal Medicine

## 2011-01-03 DIAGNOSIS — T887XXA Unspecified adverse effect of drug or medicament, initial encounter: Secondary | ICD-10-CM

## 2011-01-03 DIAGNOSIS — E785 Hyperlipidemia, unspecified: Secondary | ICD-10-CM

## 2011-01-03 LAB — LDL CHOLESTEROL, DIRECT: Direct LDL: 146.9 mg/dL

## 2011-01-03 LAB — HEPATIC FUNCTION PANEL
ALT: 48 U/L (ref 0–53)
AST: 36 U/L (ref 0–37)
Albumin: 4.1 g/dL (ref 3.5–5.2)
Alkaline Phosphatase: 36 U/L — ABNORMAL LOW (ref 39–117)
Bilirubin, Direct: 0.2 mg/dL (ref 0.0–0.3)
Total Bilirubin: 0.8 mg/dL (ref 0.3–1.2)
Total Protein: 6.5 g/dL (ref 6.0–8.3)

## 2011-01-03 LAB — LIPID PANEL
Cholesterol: 232 mg/dL — ABNORMAL HIGH (ref 0–200)
VLDL: 49.8 mg/dL — ABNORMAL HIGH (ref 0.0–40.0)

## 2011-01-09 ENCOUNTER — Other Ambulatory Visit: Payer: Self-pay | Admitting: *Deleted

## 2011-01-09 ENCOUNTER — Ambulatory Visit (INDEPENDENT_AMBULATORY_CARE_PROVIDER_SITE_OTHER): Payer: BC Managed Care – PPO | Admitting: Internal Medicine

## 2011-01-09 ENCOUNTER — Encounter: Payer: Self-pay | Admitting: Internal Medicine

## 2011-01-09 VITALS — BP 134/80 | HR 76 | Temp 98.2°F | Resp 16 | Ht 75.0 in | Wt 264.0 lb

## 2011-01-09 DIAGNOSIS — E8881 Metabolic syndrome: Secondary | ICD-10-CM

## 2011-01-09 DIAGNOSIS — N529 Male erectile dysfunction, unspecified: Secondary | ICD-10-CM

## 2011-01-09 DIAGNOSIS — J329 Chronic sinusitis, unspecified: Secondary | ICD-10-CM

## 2011-01-09 DIAGNOSIS — M899 Disorder of bone, unspecified: Secondary | ICD-10-CM

## 2011-01-09 DIAGNOSIS — M949 Disorder of cartilage, unspecified: Secondary | ICD-10-CM

## 2011-01-09 DIAGNOSIS — M8580 Other specified disorders of bone density and structure, unspecified site: Secondary | ICD-10-CM

## 2011-01-09 DIAGNOSIS — I10 Essential (primary) hypertension: Secondary | ICD-10-CM

## 2011-01-09 MED ORDER — AZITHROMYCIN 250 MG PO TABS: 250.0000 mg | ORAL_TABLET | Freq: Every day | ORAL | Status: AC

## 2011-01-09 MED ORDER — HYDROCODONE-ACETAMINOPHEN 5-500 MG PO CAPS: 1.0000 | ORAL_CAPSULE | Freq: Three times a day (TID) | ORAL | Status: DC | PRN

## 2011-01-09 NOTE — Progress Notes (Signed)
  Subjective:    Patient ID: Paul Morrison, male    DOB: March 13, 1956, 55 y.o.   MRN: [id number]  HPI    Review of Systems     Objective:   Physical Exam        Assessment & Plan:

## 2011-01-10 LAB — HEMOGLOBIN A1C: Hgb A1c MFr Bld: 6.2 % (ref 4.6–6.5)

## 2011-01-10 LAB — VITAMIN D 25 HYDROXY (VIT D DEFICIENCY, FRACTURES): Vit D, 25-Hydroxy: 52 ng/mL (ref 30–89)

## 2011-01-10 LAB — TESTOSTERONE: Testosterone: 299.23 ng/dL — ABNORMAL LOW (ref 350.00–890.00)

## 2011-01-13 ENCOUNTER — Telehealth: Payer: Self-pay | Admitting: *Deleted

## 2011-01-13 NOTE — Telephone Encounter (Signed)
Per dr Lovell Sheehan- may have levaquin 500 qd for 10 days

## 2011-01-13 NOTE — Telephone Encounter (Signed)
Pt wants Dr. Lovell Sheehan to know he is not feeling any better on antibiotics for sinus infection.

## 2011-01-17 ENCOUNTER — Telehealth: Payer: Self-pay | Admitting: *Deleted

## 2011-01-17 ENCOUNTER — Other Ambulatory Visit: Payer: Self-pay | Admitting: *Deleted

## 2011-01-17 DIAGNOSIS — E291 Testicular hypofunction: Secondary | ICD-10-CM

## 2011-01-17 MED ORDER — TESTOSTERONE 30 MG/ACT TD SOLN: 1.0000 | Freq: Every day | TRANSDERMAL | Status: DC

## 2011-01-17 NOTE — Telephone Encounter (Signed)
His testosterone was 299 we will start him on axillary testosterone prescription has been since treated he will need to pick it up or call it into his pharmacy

## 2011-01-17 NOTE — Telephone Encounter (Signed)
Here it is

## 2011-01-17 NOTE — Telephone Encounter (Signed)
Had labwork last Monday and was told depending on results rx may be called in to [facility].  Please advise of results.

## 2011-01-18 NOTE — Telephone Encounter (Signed)
Called pt and offered him a coupon ,but he states he went and got it yesterday without coupon

## 2011-02-16 ENCOUNTER — Ambulatory Visit (INDEPENDENT_AMBULATORY_CARE_PROVIDER_SITE_OTHER): Payer: BC Managed Care – PPO | Admitting: Internal Medicine

## 2011-02-16 VITALS — BP 140/94 | HR 76 | Temp 98.2°F | Resp 16 | Ht 75.0 in | Wt 260.0 lb

## 2011-02-16 DIAGNOSIS — E291 Testicular hypofunction: Secondary | ICD-10-CM

## 2011-02-16 DIAGNOSIS — G510 Bell's palsy: Secondary | ICD-10-CM

## 2011-02-16 MED ORDER — TESTOSTERONE 30 MG/ACT TD SOLN: 1.0000 | Freq: Every day | TRANSDERMAL | Status: DC

## 2011-02-16 MED ORDER — TELMISARTAN 80 MG PO TABS: 80.0000 mg | ORAL_TABLET | Freq: Every day | ORAL | Status: DC

## 2011-02-16 MED ORDER — VALACYCLOVIR HCL 1 G PO TABS: 1000.0000 mg | ORAL_TABLET | Freq: Three times a day (TID) | ORAL | Status: DC

## 2011-02-16 MED ORDER — PREDNISONE 20 MG PO TABS: 20.0000 mg | ORAL_TABLET | Freq: Every day | ORAL | Status: AC

## 2011-02-16 NOTE — Progress Notes (Signed)
  Subjective:    Patient ID: Paul Morrison, male    DOB: 09/12/1955, 55 y.o.   MRN: [id number]  HPI  Right sided facial numbness, facial swelling Cannot close eye Went to the denist this AM He was concerned that the right-sided facial numbness and swelling was due to an infected root canal. He did notice that he has good patella but when he was drinking coffee this morning he could not close his eye fully the presumptive diagnosis is Bells palsy  Review of Systems  Constitutional: Negative for fever and fatigue.  HENT: Negative for hearing loss, congestion, neck pain and postnasal drip.   Eyes: Positive for visual disturbance. Negative for discharge and redness.  Respiratory: Negative for cough, shortness of breath and wheezing.   Cardiovascular: Negative for leg swelling.  Gastrointestinal: Negative for abdominal pain, constipation and abdominal distention.  Genitourinary: Negative for urgency and frequency.  Musculoskeletal: Negative for joint swelling and arthralgias.  Skin: Negative for color change and rash.  Neurological: Positive for facial asymmetry. Negative for weakness and light-headedness.  Hematological: Negative for adenopathy.  Psychiatric/Behavioral: Negative for behavioral problems.   Past Medical History  Diagnosis Date  . Hyperlipidemia   . Low back pain   . Hypertension    Past Surgical History  Procedure Date  . Vasectomy   . Hernia repair   . Keratatomy   . Lumbar fusion   . Knee arthroscopy     reports that he has quit smoking. He does not have any smokeless tobacco history on file. He reports that he drinks alcohol. He reports that he does not use illicit drugs. family history includes Aneurysm in an unspecified family member; Asthma in his mother; Coronary artery disease in an unspecified family member; Hyperlipidemia in his mother; and Hypertension in his mother. Allergies  Allergen Reactions  . Celecoxib   . Hydrochlorothiazide   . Nsaids   .  Oxycodone-Acetaminophen   . Pregabalin        Objective:   Physical Exam  Nursing note and vitals reviewed. Constitutional: He appears well-developed and well-nourished.  HENT:  Head: Normocephalic and atraumatic.       Patient has marked facial asymmetry with inability to close his right eye fully and asymmetry of smile of 24 hours to 48 hours duration  Eyes: Conjunctivae are normal. Pupils are equal, round, and reactive to light.  Neck: Normal range of motion. Neck supple.  Cardiovascular: Normal rate and regular rhythm.   Pulmonary/Chest: Effort normal and breath sounds normal.  Abdominal: Soft. Bowel sounds are normal.          Assessment & Plan:  Probable early Bell's palsy we'll treat with a course of prednisone and  The patient was counseled about Bell's palsy and was asked to contact our office in one week with progression or resolution of her symptomatology early intervention with acyclovir and prednisone has the best possibility of aborting the full paralysis of the space.  We discussed with the patient was to expect should Bell's palsy progress and what are the possible sequelae of  Bell's palsy

## 2011-02-16 NOTE — Patient Instructions (Signed)
Bell's Palsy   Bell's palsy is a condition in which the muscles on one side of the face cannot move (paralysis). This is because the nerves in the face are paralyzed. It is most often thought to be caused by a virus. The virus causes swelling of the nerve that controls movement on one side of the face. The nerve travels through a tight space surrounded by bone. When the nerve swells, it can be compressed by the bone. This results in damage to the protective covering around the nerve. This damage interferes with how the nerve communicates with the muscles of the face. As a result, it can cause weakness or paralysis of the facial muscles.   Injury (trauma), tumor, and surgery may cause Bell's palsy, but most of the time the cause is unknown. It is a relatively common condition. It starts suddenly (abrupt onset) with the paralysis usually ending within 2 days. Bell's palsy is not dangerous. But because the eye does not close properly, you may need care to keep the eye from getting dry. This can include splinting (to keep the eye shut) or moistening with artificial tears. Bell's palsy very seldom occurs on both sides of the face at the same time.   SYMPTOMS   Eyebrow sagging.   Drooping of the eye and corner of the mouth.   Inability to close one eye.   Loss of taste on the front of the tongue.   Sensitivity to loud noises.   TREATMENT   The treatment is usually non-surgical. If the patient is seen within the first 24 to 48 hours, a short course of steroids may be prescribed, in an attempt to shorten the length of the condition. Antiviral medicines may also be used with the steroids, but it is unclear if they are helpful.   You will need to protect your eye, if you cannot close it. The cornea (clear covering over your eye) will become dry and can be damaged. Artificial tears can be used to keep your eye moist. Glasses or an eye patch should be worn to protect your eye.   PROGNOSIS   Recovery is variable, ranging from  days to months. Although the problem usually goes away completely (about 80% of cases resolve), predicting the outcome is impossible. Most people improve within 3 weeks of when the symptoms began. Improvement may continue for 3-6 months. A small number of people have moderate to severe weakness that is permanent.   HOME CARE INSTRUCTIONS   If your caregiver prescribed medicine to reduce swelling in the nerve, use as directed. Do not stop taking the medicine unless directed by your caregiver.   Use moisturizing eye drops as needed to prevent drying of your eye, as directed by your caregiver.   Protect your eye, as directed by your caregiver.   Use facial massage and exercises, as directed by your caregiver.   Perform your normal activities, and get your normal rest.   SEEK IMMEDIATE MEDICAL CARE IF:   There is pain, redness or irritation in the eye.   You or your child has an oral temperature above 101, not controlled by medicine.   MAKE SURE YOU:   Understand these instructions.   Will watch your condition.   Will get help right away if you are not doing well or get worse.   Document Released: 07/17/2005 Document Re-Released: 10/11/2009   [employer]® Patient Information ©2011 [employer].

## 2011-03-02 ENCOUNTER — Encounter: Payer: Self-pay | Admitting: Internal Medicine

## 2011-03-02 MED ORDER — TESTOSTERONE 30 MG/ACT TD SOLN: 1.0000 | Freq: Every day | TRANSDERMAL | Status: DC

## 2011-05-12 ENCOUNTER — Other Ambulatory Visit: Payer: Self-pay | Admitting: Internal Medicine

## 2011-06-20 ENCOUNTER — Telehealth: Payer: Self-pay | Admitting: Internal Medicine

## 2011-06-20 NOTE — Telephone Encounter (Signed)
Is it ok for pt se see another doctor if he requests?

## 2011-06-20 NOTE — Telephone Encounter (Signed)
Per dr Yvonne Kendall see anyone

## 2011-06-20 NOTE — Telephone Encounter (Signed)
11-28 cpx appointment is available at 2:45

## 2011-06-20 NOTE — Telephone Encounter (Signed)
Pt called to schedule cpx Paul Morrison scheduled him for first available pt called back and said that appt will not work for him that he needed a cpx before the end of the year. Pt would like to be worked in.

## 2011-06-21 ENCOUNTER — Other Ambulatory Visit: Payer: Self-pay | Admitting: *Deleted

## 2011-06-21 DIAGNOSIS — Z0000 Encounter for general adult medical examination without abnormal findings: Secondary | ICD-10-CM

## 2011-06-21 NOTE — Telephone Encounter (Signed)
Paul Morrison no other provider has cpx before end of yr.

## 2011-06-21 NOTE — Telephone Encounter (Signed)
Pt will come in on 07-03-2011 for cpx. Pt is aware cpx not yr to date last cpx 07-18-2010. Pt will go to [facility] for cpx labs bonnie will put order in system

## 2011-06-21 NOTE — Telephone Encounter (Signed)
Try dec 3 at 3:30 2 slots--thanks

## 2011-06-21 NOTE — Telephone Encounter (Signed)
Appointment made and labs ordered for [facility]

## 2011-06-27 ENCOUNTER — Other Ambulatory Visit: Payer: Self-pay | Admitting: Internal Medicine

## 2011-06-27 ENCOUNTER — Other Ambulatory Visit (INDEPENDENT_AMBULATORY_CARE_PROVIDER_SITE_OTHER): Payer: BC Managed Care – PPO

## 2011-06-27 DIAGNOSIS — Z0000 Encounter for general adult medical examination without abnormal findings: Secondary | ICD-10-CM

## 2011-06-27 LAB — HEPATIC FUNCTION PANEL
ALT: 73 U/L — ABNORMAL HIGH (ref 0–53)
Albumin: 3.9 g/dL (ref 3.5–5.2)
Alkaline Phosphatase: 41 U/L (ref 39–117)
Total Protein: 6.2 g/dL (ref 6.0–8.3)

## 2011-06-27 LAB — CBC WITH DIFFERENTIAL/PLATELET
Basophils Relative: 0.4 % (ref 0.0–3.0)
Eosinophils Absolute: 0.3 10*3/uL (ref 0.0–0.7)
Hemoglobin: 16.1 g/dL (ref 13.0–17.0)
Lymphocytes Relative: 27.3 % (ref 12.0–46.0)
MCHC: 34.4 g/dL (ref 30.0–36.0)
MCV: 92.9 fl (ref 78.0–100.0)
Neutro Abs: 4.3 10*3/uL (ref 1.4–7.7)
RBC: 5.02 Mil/uL (ref 4.22–5.81)

## 2011-06-27 LAB — BASIC METABOLIC PANEL
Calcium: 9.2 mg/dL (ref 8.4–10.5)
Chloride: 101 mEq/L (ref 96–112)
Creatinine, Ser: 1.1 mg/dL (ref 0.4–1.5)
GFR: 75.61 mL/min (ref >60.00)

## 2011-06-27 LAB — TSH: TSH: 4.47 u[IU]/mL (ref 0.35–5.50)

## 2011-06-27 LAB — LIPID PANEL
Cholesterol: 214 mg/dL — ABNORMAL HIGH (ref 0–200)
VLDL: 68 mg/dL — ABNORMAL HIGH (ref 0.0–40.0)

## 2011-07-03 ENCOUNTER — Ambulatory Visit (INDEPENDENT_AMBULATORY_CARE_PROVIDER_SITE_OTHER): Payer: BC Managed Care – PPO | Admitting: Internal Medicine

## 2011-07-03 ENCOUNTER — Encounter: Payer: Self-pay | Admitting: Internal Medicine

## 2011-07-03 DIAGNOSIS — E785 Hyperlipidemia, unspecified: Secondary | ICD-10-CM

## 2011-07-03 DIAGNOSIS — Z0000 Encounter for general adult medical examination without abnormal findings: Secondary | ICD-10-CM

## 2011-07-03 DIAGNOSIS — M549 Dorsalgia, unspecified: Secondary | ICD-10-CM

## 2011-07-03 MED ORDER — HYDROCODONE-ACETAMINOPHEN 5-500 MG PO CAPS: 1.0000 | ORAL_CAPSULE | Freq: Three times a day (TID) | ORAL | Status: DC | PRN

## 2011-07-03 MED ORDER — OMEGA-3 FATTY ACIDS 1000 MG PO CAPS: 2.0000 g | ORAL_CAPSULE | Freq: Every day | ORAL | Status: AC

## 2011-07-03 NOTE — Progress Notes (Signed)
Subjective:    Patient ID: Paul Morrison, male    DOB: Sep 12, 1955, 55 y.o.   MRN: [id number]  HPI He  presents for complete physical examination he requires refill of his hydrocodone for occasional back pain otherwise he is doing well  Review of Systems  Constitutional: Negative for fever and fatigue.  HENT: Negative for hearing loss, congestion, neck pain and postnasal drip.   Eyes: Negative for discharge, redness, itching and visual disturbance.  Respiratory: Negative for cough, shortness of breath and wheezing.   Cardiovascular: Negative for leg swelling.  Gastrointestinal: Negative for abdominal pain, constipation and abdominal distention.  Genitourinary: Negative for urgency and frequency.  Musculoskeletal: Positive for back pain and joint swelling. Negative for arthralgias.  Skin: Negative for color change and rash.  Neurological: Negative for weakness and light-headedness.  Hematological: Negative for adenopathy.  Psychiatric/Behavioral: Negative for behavioral problems.   Past Medical History  Diagnosis Date  . Hyperlipidemia   . Low back pain   . Hypertension     History   Social History  . Marital Status: Married    Spouse Name: N/A    Number of Children: N/A  . Years of Education: N/A   Occupational History  . Not on file.   Social History Main Topics  . Smoking status: Former Games developer  . Smokeless tobacco: Not on file  . Alcohol Use: Yes  . Drug Use: No  . Sexually Active: Yes   Other Topics Concern  . Not on file   Social History Narrative  . No narrative on file    Past Surgical History  Procedure Date  . Vasectomy   . Hernia repair   . Keratatomy   . Lumbar fusion   . Knee arthroscopy     Family History  Problem Relation Age of Onset  . Coronary artery disease    . Aneurysm    . Hyperlipidemia Mother   . Hypertension Mother   . Asthma Mother     Allergies  Allergen Reactions  . Celecoxib   . Hydrochlorothiazide   . Nsaids   .  Oxycodone-Acetaminophen   . Pregabalin     Current Outpatient Prescriptions on File Prior to Visit  Medication Sig Dispense Refill  . Cholecalciferol (VITAMIN D3) 1000 UNITS CAPS Take 2,000 Units by mouth daily.        Marland Kitchen glucosamine-chondroitin (MAX GLUCOSAMINE CHONDROITIN) 500-400 MG tablet Take 1 tablet by mouth daily.        . meloxicam (MOBIC) 15 MG tablet Take 15 mg by mouth daily.        Marland Kitchen NASONEX 50 MCG/ACT nasal spray TWO SPRAYS IN EACH NOSTRIL ONCE DAILY  17 g  11  . telmisartan (MICARDIS) 80 MG tablet Take 1 tablet (80 mg total) by mouth daily.  30 tablet  11  . Testosterone 30 MG/ACT SOLN Place 1 Applicatorful onto the skin daily.  90 mL  4  . VYTORIN 10-40 MG per tablet TAKE ONE TABLET EVERY DAY  30 tablet  6    BP 140/88  Pulse 76  Temp 98.2 F (36.8 C)  Resp 16  Ht 6\' 3"  (1.905 m)  Wt 264 lb (119.75 kg)  BMI 33.00 kg/m2       Objective:   Physical Exam  Nursing note and vitals reviewed. Constitutional: He appears well-developed and well-nourished.  HENT:  Head: Normocephalic and atraumatic.  Eyes: Conjunctivae are normal. Pupils are equal, round, and reactive to light.  Neck: Normal range of motion.  Neck supple.  Cardiovascular: Normal rate and regular rhythm.   Pulmonary/Chest: Effort normal and breath sounds normal.  Abdominal: Soft. Bowel sounds are normal.  Musculoskeletal: Normal range of motion. He exhibits edema.       Midline scar in lumbar back with lordosis  Skin: Skin is warm.          Assessment & Plan:   Patient presents for yearly preventative medicine examination.   all immunizations and health maintenance protocols were reviewed with the patient and they are up to date with these protocols.   screening laboratory values were reviewed with the patient including screening of hyperlipidemia PSA renal function and hepatic function.   There medications past medical history social history problem list and allergies were reviewed in  detail.   Goals were established with regard to weight loss exercise diet in compliance with medications

## 2011-07-03 NOTE — Patient Instructions (Signed)
Resume the fish oil for the cholesterol

## 2011-08-30 ENCOUNTER — Encounter: Payer: BC Managed Care – PPO | Admitting: Internal Medicine

## 2011-09-15 ENCOUNTER — Other Ambulatory Visit: Payer: Self-pay | Admitting: Internal Medicine

## 2011-12-11 ENCOUNTER — Other Ambulatory Visit: Payer: Self-pay | Admitting: Internal Medicine

## 2012-02-16 ENCOUNTER — Other Ambulatory Visit: Payer: Self-pay | Admitting: Internal Medicine

## 2012-02-29 ENCOUNTER — Telehealth: Payer: Self-pay | Admitting: Internal Medicine

## 2012-02-29 DIAGNOSIS — Z0000 Encounter for general adult medical examination without abnormal findings: Secondary | ICD-10-CM

## 2012-02-29 NOTE — Telephone Encounter (Signed)
Tall he can go when he wants to go -orders in

## 2012-02-29 NOTE — Telephone Encounter (Signed)
Pt does not want to receive his cpx labs here requesting to have them at [facility] please advise

## 2012-05-08 ENCOUNTER — Other Ambulatory Visit (INDEPENDENT_AMBULATORY_CARE_PROVIDER_SITE_OTHER): Payer: BC Managed Care – PPO

## 2012-05-08 DIAGNOSIS — Z0000 Encounter for general adult medical examination without abnormal findings: Secondary | ICD-10-CM

## 2012-05-08 LAB — BASIC METABOLIC PANEL
BUN: 19 mg/dL (ref 6–23)
Calcium: 9.2 mg/dL (ref 8.4–10.5)
GFR: 77.86 mL/min (ref >60.00)
Potassium: 4.7 mEq/L (ref 3.5–5.1)
Sodium: 138 mEq/L (ref 135–145)

## 2012-05-08 LAB — CBC WITH DIFFERENTIAL/PLATELET
Basophils Relative: 0.3 % (ref 0.0–3.0)
Eosinophils Relative: 3.5 % (ref 0.0–5.0)
HCT: 49.5 % (ref 39.0–52.0)
Hemoglobin: 16.5 g/dL (ref 13.0–17.0)
Lymphs Abs: 1.9 10*3/uL (ref 0.7–4.0)
MCV: 94.6 fl (ref 78.0–100.0)
Monocytes Absolute: 0.6 10*3/uL (ref 0.1–1.0)
RBC: 5.24 Mil/uL (ref 4.22–5.81)
WBC: 7.3 10*3/uL (ref 4.5–10.5)

## 2012-05-08 LAB — HEPATIC FUNCTION PANEL
ALT: 52 U/L (ref 0–53)
AST: 34 U/L (ref 0–37)
Total Protein: 6.3 g/dL (ref 6.0–8.3)

## 2012-05-08 LAB — LIPID PANEL
HDL: 42.2 mg/dL (ref >39.00)
Triglycerides: 350 mg/dL — ABNORMAL HIGH (ref 0.0–149.0)

## 2012-05-08 LAB — LDL CHOLESTEROL, DIRECT: Direct LDL: 117.2 mg/dL

## 2012-05-08 LAB — TSH: TSH: 3.71 u[IU]/mL (ref 0.35–5.50)

## 2012-05-14 ENCOUNTER — Ambulatory Visit (INDEPENDENT_AMBULATORY_CARE_PROVIDER_SITE_OTHER): Payer: BC Managed Care – PPO | Admitting: Internal Medicine

## 2012-05-14 ENCOUNTER — Encounter: Payer: Self-pay | Admitting: Internal Medicine

## 2012-05-14 VITALS — BP 146/84 | HR 76 | Temp 98.6°F | Resp 16 | Ht 75.0 in | Wt 262.0 lb

## 2012-05-14 DIAGNOSIS — M549 Dorsalgia, unspecified: Secondary | ICD-10-CM

## 2012-05-14 DIAGNOSIS — Z23 Encounter for immunization: Secondary | ICD-10-CM

## 2012-05-14 DIAGNOSIS — Z0000 Encounter for general adult medical examination without abnormal findings: Secondary | ICD-10-CM

## 2012-05-14 DIAGNOSIS — E785 Hyperlipidemia, unspecified: Secondary | ICD-10-CM

## 2012-05-14 MED ORDER — HYDROCODONE-ACETAMINOPHEN 5-500 MG PO CAPS: 1.0000 | ORAL_CAPSULE | Freq: Three times a day (TID) | ORAL | Status: DC | PRN

## 2012-05-14 NOTE — Progress Notes (Signed)
  Subjective:    Patient ID: Paul Morrison, male    DOB: 08/07/1955, 56 y.o.   MRN: [id number]  HPI    Review of Systems  Constitutional: Negative for fever and fatigue.  HENT: Negative for hearing loss, congestion, neck pain and postnasal drip.   Eyes: Negative for discharge, redness and visual disturbance.  Respiratory: Negative for cough, shortness of breath and wheezing.   Cardiovascular: Negative for leg swelling.  Gastrointestinal: Negative for abdominal pain, constipation and abdominal distention.  Genitourinary: Negative for urgency and frequency.  Musculoskeletal: Negative for joint swelling and arthralgias.  Skin: Negative for color change and rash.  Neurological: Negative for weakness and light-headedness.  Hematological: Negative for adenopathy.  Psychiatric/Behavioral: Negative for behavioral problems.       Objective:   Physical Exam  Nursing note and vitals reviewed. Constitutional: He appears well-developed and well-nourished.  HENT:  Head: Normocephalic and atraumatic.  Eyes: Conjunctivae normal are normal. Pupils are equal, round, and reactive to light.  Neck: Normal range of motion. Neck supple.  Cardiovascular: Normal rate and regular rhythm.   Pulmonary/Chest: Effort normal and breath sounds normal.  Abdominal: Soft. Bowel sounds are normal.          Assessment & Plan:

## 2012-05-14 NOTE — Addendum Note (Signed)
Addended by: Stacie Glaze on: 05/14/2012 04:24 PM   Modules accepted: Orders, Level of Service

## 2012-05-14 NOTE — Progress Notes (Signed)
Subjective:    Patient ID: Paul Morrison, male    DOB: 07-10-1956, 56 y.o.   MRN: [id number]  HPI  cpx  Review of Systems  Constitutional: Negative for fever and fatigue.  HENT: Negative for hearing loss, congestion, neck pain and postnasal drip.   Eyes: Negative for discharge, redness and visual disturbance.  Respiratory: Negative for cough, shortness of breath and wheezing.   Cardiovascular: Negative for leg swelling.  Gastrointestinal: Negative for abdominal pain, constipation and abdominal distention.  Genitourinary: Negative for urgency and frequency.  Musculoskeletal: Negative for joint swelling and arthralgias.  Skin: Negative for color change and rash.  Neurological: Negative for weakness and light-headedness.  Hematological: Negative for adenopathy.  Psychiatric/Behavioral: Negative for behavioral problems.   Past Medical History  Diagnosis Date  . Hyperlipidemia   . Low back pain   . Hypertension     History   Social History  . Marital Status: Married    Spouse Name: N/A    Number of Children: N/A  . Years of Education: N/A   Occupational History  . Not on file.   Social History Main Topics  . Smoking status: Former Games developer  . Smokeless tobacco: Not on file  . Alcohol Use: Yes  . Drug Use: No  . Sexually Active: Yes   Other Topics Concern  . Not on file   Social History Narrative  . No narrative on file    Past Surgical History  Procedure Date  . Vasectomy   . Hernia repair   . Keratatomy   . Lumbar fusion   . Knee arthroscopy     Family History  Problem Relation Age of Onset  . Coronary artery disease    . Aneurysm    . Hyperlipidemia Mother   . Hypertension Mother   . Asthma Mother     Allergies  Allergen Reactions  . Celecoxib     Current Outpatient Prescriptions on File Prior to Visit  Medication Sig Dispense Refill  . Cholecalciferol (VITAMIN D3) 1000 UNITS CAPS Take 2,000 Units by mouth daily.        . fish oil-omega-3  fatty acids 1000 MG capsule Take 2 capsules (2 g total) by mouth daily.      Marland Kitchen glucosamine-chondroitin (MAX GLUCOSAMINE CHONDROITIN) 500-400 MG tablet Take 1 tablet by mouth daily.        . hydrocodone-acetaminophen (LORCET-HD) 5-500 MG per capsule Take 1 capsule by mouth every 8 (eight) hours as needed.  30 capsule  3  . meloxicam (MOBIC) 15 MG tablet TAKE ONE TABLET EVERY DAY  30 tablet  6  . MICARDIS 80 MG tablet TAKE ONE TABLET EACH DAY  30 tablet  6  . NASONEX 50 MCG/ACT nasal spray TWO SPRAYS IN EACH NOSTRIL ONCE DAILY  17 g  11  . Testosterone 30 MG/ACT SOLN Place 1 Applicatorful onto the skin daily.  90 mL  4  . VYTORIN 10-40 MG per tablet TAKE ONE TABLET EVERY DAY  30 tablet  6    BP 146/84  Pulse 76  Temp 98.6 F (37 C)  Resp 16  Ht 6\' 3"  (1.905 m)  Wt 262 lb (118.842 kg)  BMI 32.75 kg/m2       Objective:   Physical Exam  Constitutional: He is oriented to person, place, and time. He appears well-developed and well-nourished.  HENT:  Head: Normocephalic and atraumatic.  Eyes: Conjunctivae normal are normal. Pupils are equal, round, and reactive to light.  Neck: Normal range  of motion. Neck supple.  Cardiovascular: Normal rate and regular rhythm.   Pulmonary/Chest: Effort normal and breath sounds normal.  Abdominal: Soft. Bowel sounds are normal.  Genitourinary: Rectum normal and prostate normal.  Musculoskeletal: Normal range of motion. He exhibits tenderness.        Left knee instability  Neurological: He is alert and oriented to person, place, and time.  Psychiatric: He has a normal mood and affect. His behavior is normal.          Assessment & Plan:   Patient presents for yearly preventative medicine examination.   all immunizations and health maintenance protocols were reviewed with the patient and they are up to date with these protocols.   screening laboratory values were reviewed with the patient including screening of hyperlipidemia PSA renal function  and hepatic function.   There medications past medical history social history problem list and allergies were reviewed in detail.   Goals were established with regard to weight loss exercise diet in compliance with medications

## 2012-05-15 ENCOUNTER — Encounter: Payer: BC Managed Care – PPO | Admitting: Internal Medicine

## 2012-05-23 ENCOUNTER — Other Ambulatory Visit: Payer: Self-pay | Admitting: Internal Medicine

## 2012-07-20 ENCOUNTER — Other Ambulatory Visit: Payer: Self-pay | Admitting: Internal Medicine

## 2012-09-23 ENCOUNTER — Other Ambulatory Visit: Payer: Self-pay | Admitting: *Deleted

## 2012-09-23 MED ORDER — TELMISARTAN 80 MG PO TABS: 80.0000 mg | ORAL_TABLET | Freq: Every day | ORAL | Status: DC

## 2012-11-11 ENCOUNTER — Other Ambulatory Visit: Payer: Self-pay | Admitting: Internal Medicine

## 2013-01-14 ENCOUNTER — Other Ambulatory Visit: Payer: Self-pay | Admitting: *Deleted

## 2013-01-14 MED ORDER — HYDROCODONE-ACETAMINOPHEN 5-325 MG PO TABS: 1.0000 | ORAL_TABLET | Freq: Three times a day (TID) | ORAL | Status: DC | PRN

## 2013-02-27 ENCOUNTER — Other Ambulatory Visit: Payer: Self-pay | Admitting: *Deleted

## 2013-02-27 MED ORDER — MOMETASONE FUROATE 50 MCG/ACT NA SUSP: NASAL | Status: DC

## 2013-03-06 ENCOUNTER — Other Ambulatory Visit: Payer: Self-pay | Admitting: Internal Medicine

## 2013-03-06 NOTE — Telephone Encounter (Signed)
Patient Information:  Caller Name: [name]  Phone: [phone]  Patient: Paul Morrison, Paul Morrison  Gender: Male  DOB: [birth year]  Age: 57 Years  PCP: Darryll Capers (Adults only)  Office Follow Up:  Does the office need to follow up with this patient?: No  Instructions For The Office: N/A  RN Note:  Flare up of back pain-around surgery site with a knot present around incision with pain at 9/10 level on pain scale. Patient notes that he has these flare ups and recently re-injured his knee that he thinks is aggravating his back.  Patient states pain is making him nauseous and it is difficult to talk and breath at the same time.  He has appointment tomorrow related to his knee with orthopedic surgeon.  Has appointment in office here with provider on 08/12.  Last visit was 05/14/12.  Last medication prescription for Norco/Vicoden 5/325 was given 01/14/13 with 3 refills.  Patient states he has just refilled it that one time in June.  If he has only used that 1 initially filling of script, patient should have additional refills.  He will call pharmacy back for them to recheck.  Triaged with a disposition to be seen in office now, but declined appointment noting schedule and working an hour away from home.  Also notes that he knows that pain generally resolves in a couple of days once he can start medication.  Symptoms  Reason For Call & Symptoms: Calling regarding back pain. Post spinal surgery patient about 5 years ago and has occasional pain flare ups.  I not able to be seen until next Tuesday per office; but is out of refills for pain medication.  Also has contacted pharmacy for a refill request.  Reviewed Health History In EMR: Yes  Reviewed Medications In EMR: Yes  Reviewed Allergies In EMR: Yes  Reviewed Surgeries / Procedures: Yes  Date of Onset of Symptoms: 02/27/2013  Treatments Tried: Mobic is not helping  Treatments Tried Worked: No  Guideline(s) Used:  Back Pain  Disposition Per Guideline:    Go to Office Now  Reason For Disposition Reached:   Severe back pain  Advice Given:  Cold or Heat:  Cold Pack: For pain or swelling, use a cold pack or ice wrapped in a wet cloth. Put it on the sore area for 20 minutes. Repeat 4 times on the first day, then as needed.  Heat Pack: If pain lasts over 2 days, apply heat to the sore area. Use a heat pack, heating pad, or warm wet washcloth. Do this for 10 minutes, then as needed. For widespread stiffness, take a hot bath or hot shower instead. Move the sore area under the warm water.  Sleep:  Avoid sleeping on your stomach.  Activity  Keep doing your day-to-day activities if it is not too painful. Staying active is better than resting.  Pain Medicines:  For pain relief, take acetaminophen, ibuprofen, or naproxen.  Call Back If:  Numbness or weakness occur  Bowel/bladder problems occur  Pain lasts for more than 2 weeks  You become worse.  Patient Refused Recommendation:  Patient Will Follow Up With Office Later  Has appointment next week and declined acute appointment

## 2013-03-11 ENCOUNTER — Ambulatory Visit (INDEPENDENT_AMBULATORY_CARE_PROVIDER_SITE_OTHER): Payer: BC Managed Care – PPO | Admitting: Family

## 2013-03-11 ENCOUNTER — Encounter: Payer: Self-pay | Admitting: Family

## 2013-03-11 ENCOUNTER — Telehealth: Payer: Self-pay | Admitting: Internal Medicine

## 2013-03-11 ENCOUNTER — Other Ambulatory Visit: Payer: Self-pay | Admitting: *Deleted

## 2013-03-11 VITALS — BP 138/76 | HR 62 | Wt 265.0 lb

## 2013-03-11 DIAGNOSIS — G8929 Other chronic pain: Secondary | ICD-10-CM

## 2013-03-11 DIAGNOSIS — IMO0002 Reserved for concepts with insufficient information to code with codable children: Secondary | ICD-10-CM

## 2013-03-11 DIAGNOSIS — M545 Low back pain, unspecified: Secondary | ICD-10-CM

## 2013-03-11 DIAGNOSIS — Z0000 Encounter for general adult medical examination without abnormal findings: Secondary | ICD-10-CM

## 2013-03-11 DIAGNOSIS — M5416 Radiculopathy, lumbar region: Secondary | ICD-10-CM

## 2013-03-11 MED ORDER — PREDNISONE 20 MG PO TABS: ORAL_TABLET | ORAL | Status: DC

## 2013-03-11 NOTE — Telephone Encounter (Signed)
Try dec 1 at 11;45

## 2013-03-11 NOTE — Telephone Encounter (Signed)
PT is requesting a CPX be completed by Dr. Lovell Sheehan, by the end of this year. Please assist.

## 2013-03-11 NOTE — Patient Instructions (Signed)
Osteoarthritis Osteoarthritis is the most common form of arthritis. It is redness, soreness, and swelling (inflammation) affecting the cartilage. Cartilage acts as a cushion, covering the ends of bones where they meet to form a joint. CAUSES  Over time, the cartilage begins to wear away. This causes bone to rub on bone. This produces pain and stiffness in the affected joints. Factors that contribute to this problem are:  Excessive body weight.  Age.  Overuse of joints. SYMPTOMS   People with osteoarthritis usually experience joint pain, swelling, or stiffness.  Over time, the joint may lose its normal shape.  Small deposits of bone (osteophytes) may grow on the edges of the joint.  Bits of bone or cartilage can break off and float inside the joint space. This may cause more pain and damage.  Osteoarthritis can lead to depression, anxiety, feelings of helplessness, and limitations on daily activities. The most commonly affected joints are in the:  Ends of the fingers.  Thumbs.  Neck.  Lower back.  Knees.  Hips. DIAGNOSIS  Diagnosis is mostly based on your symptoms and exam. Tests may be helpful, including:  X-rays of the affected joint.  A computerized magnetic scan (MRI).  Blood tests to rule out other types of arthritis.  Joint fluid tests. This involves using a needle to draw fluid from the joint and examining the fluid under a microscope. TREATMENT  Goals of treatment are to control pain, improve joint function, maintain a normal body weight, and maintain a healthy lifestyle. Treatment approaches may include:  A prescribed exercise program with rest and joint relief.  Weight control with nutritional education.  Pain relief techniques such as:  Properly applied heat and cold.  Electric pulses delivered to nerve endings under the skin (transcutaneous electrical nerve stimulation, TENS).  Massage.  Certain supplements. Ask your caregiver before using any  supplements, especially in combination with prescribed drugs.  Medicines to control pain, such as:  Acetaminophen.  Nonsteroidal anti-inflammatory drugs (NSAIDs), such as naproxen.  Narcotic or central-acting agents, such as tramadol. This drug carries a risk of addiction and is generally prescribed for short-term use.  Corticosteroids. These can be given orally or as injection. This is a short-term treatment, not recommended for routine use.  Surgery to reposition the bones and relieve pain (osteotomy) or to remove loose pieces of bone and cartilage. Joint replacement may be needed in advanced states of osteoarthritis. HOME CARE INSTRUCTIONS  Your caregiver can recommend specific types of exercise. These may include:  Strengthening exercises. These are done to strengthen the muscles that support joints affected by arthritis. They can be performed with weights or with exercise bands to add resistance.  Aerobic activities. These are exercises, such as brisk walking or low-impact aerobics, that get your heart pumping. They can help keep your lungs and circulatory system in shape.  Range-of-motion activities. These keep your joints limber.  Balance and agility exercises. These help you maintain daily living skills. Learning about your condition and being actively involved in your care will help improve the course of your osteoarthritis. SEEK MEDICAL CARE IF:   You feel hot or your skin turns red.  You develop a rash in addition to your joint pain.  You have an oral temperature above 102 F (38.9 C). FOR MORE INFORMATION  National Institute of Arthritis and Musculoskeletal and Skin Diseases: www.niams.nih.gov National Institute on Aging: www.nia.nih.gov American College of Rheumatology: www.rheumatology.org Document Released: 07/17/2005 Document Revised: 10/09/2011 Document Reviewed: 10/28/2009 [employer] Patient Information 2014 [employer].  

## 2013-03-11 NOTE — Telephone Encounter (Signed)
PT accepted appt and requested to have orders placed to have his fasting CPX labs done at [facility]. Thank you!

## 2013-03-12 NOTE — Progress Notes (Signed)
Subjective:    Patient ID: Paul Morrison, male    DOB: Jun 04, 1956, 57 y.o.   MRN: [id number]  HPI 57 year old white male, nonsmoker, patient of Dr. Lovell Sheehan is in today with a flare of chronic low back pain. He has had ongoing back pain x8 years. He had surgery to correct issue and has been 5 last set years. He believes that his back pain is increased related to stress. The intensity waxes and remains but is approximately a 9/10 today. In the past, patient reports that he had been on narcotic analgesia and does not want to become dependent on narcotics for his pain. He describes it as sore most days with sharp pain with movement.   Review of Systems  Constitutional: Negative.   HENT: Negative.   Respiratory: Negative.   Cardiovascular: Negative.   Musculoskeletal: Positive for back pain. Negative for myalgias and gait problem.  Skin: Negative.   Neurological: Negative.   Hematological: Negative.   Psychiatric/Behavioral: Negative.    Past Medical History  Diagnosis Date  . Hyperlipidemia   . Low back pain   . Hypertension     History   Social History  . Marital Status: Married    Spouse Name: N/A    Number of Children: N/A  . Years of Education: N/A   Occupational History  . Not on file.   Social History Main Topics  . Smoking status: Former Games developer  . Smokeless tobacco: Not on file  . Alcohol Use: Yes  . Drug Use: No  . Sexual Activity: Yes   Other Topics Concern  . Not on file   Social History Narrative  . No narrative on file    Past Surgical History  Procedure Laterality Date  . Vasectomy    . Hernia repair    . Keratatomy    . Lumbar fusion    . Knee arthroscopy      Family History  Problem Relation Age of Onset  . Coronary artery disease    . Aneurysm    . Hyperlipidemia Mother   . Hypertension Mother   . Asthma Mother     Allergies  Allergen Reactions  . Celecoxib     Current Outpatient Prescriptions on File Prior to Visit  Medication  Sig Dispense Refill  . Cholecalciferol (VITAMIN D3) 1000 UNITS CAPS Take 2,000 Units by mouth daily.        Marland Kitchen glucosamine-chondroitin (MAX GLUCOSAMINE CHONDROITIN) 500-400 MG tablet Take 1 tablet by mouth daily.        Marland Kitchen HYDROcodone-acetaminophen (NORCO/VICODIN) 5-325 MG per tablet TAKE ONE TABLET EVERY EIGHT HOURS AS NEEDED  30 tablet  2  . meloxicam (MOBIC) 15 MG tablet TAKE ONE TABLET EVERY DAY  30 tablet  3  . mometasone (NASONEX) 50 MCG/ACT nasal spray TWO SPRAYS IN EACH NOSTRIL ONCE DAILY  17 g  5  . telmisartan (MICARDIS) 80 MG tablet Take 1 tablet (80 mg total) by mouth daily.  30 tablet  6  . VYTORIN 10-40 MG per tablet TAKE ONE TABLET EVERY DAY  90 tablet  3  . Testosterone 30 MG/ACT SOLN Place 1 Applicatorful onto the skin daily.  90 mL  4   No current facility-administered medications on file prior to visit.    BP 138/76  Pulse 62  Wt 265 lb (120.203 kg)  BMI 33.12 kg/m2chart    Objective:   Physical Exam  Constitutional: He is oriented to person, place, and time. He appears well-developed and well-nourished.  HENT:  Right Ear: External ear normal.  Left Ear: External ear normal.  Nose: Nose normal.  Mouth/Throat: Oropharynx is clear and moist.  Neck: Normal range of motion. Neck supple. No thyromegaly present.  Cardiovascular: Normal rate, regular rhythm and normal heart sounds.   Pulmonary/Chest: Effort normal and breath sounds normal.  Abdominal: Soft. Bowel sounds are normal.  Neurological: He is alert and oriented to person, place, and time. He has normal reflexes.  Skin: Skin is warm and dry.  Psychiatric: He has a normal mood and affect.          Assessment & Plan:  Assessment: 1. Chronic low back pain 2. Lumbar radiculopathy  Plan: Advised patient to go back and see his neurosurgeon. Call if he needs a referral. Prednisone 60x3, 40x3, 20x3. Vicodin as needed for pain as previously prescribed by Dr. Lovell Sheehan. Recheck here as scheduled unless needed  sooner.

## 2013-04-18 ENCOUNTER — Other Ambulatory Visit: Payer: Self-pay | Admitting: Internal Medicine

## 2013-06-13 ENCOUNTER — Encounter: Payer: Self-pay | Admitting: Gastroenterology

## 2013-06-23 ENCOUNTER — Other Ambulatory Visit: Payer: Self-pay | Admitting: Internal Medicine

## 2013-06-23 ENCOUNTER — Telehealth: Payer: Self-pay | Admitting: Internal Medicine

## 2013-06-23 DIAGNOSIS — Z0000 Encounter for general adult medical examination without abnormal findings: Secondary | ICD-10-CM

## 2013-06-23 NOTE — Telephone Encounter (Signed)
Pt has appt for cpx on 06-30-13. Pt would like to go to [facility] for cpx labs. Please put order in system

## 2013-06-23 NOTE — Telephone Encounter (Signed)
done

## 2013-06-25 ENCOUNTER — Other Ambulatory Visit (INDEPENDENT_AMBULATORY_CARE_PROVIDER_SITE_OTHER): Payer: BC Managed Care – PPO

## 2013-06-25 DIAGNOSIS — Z0000 Encounter for general adult medical examination without abnormal findings: Secondary | ICD-10-CM

## 2013-06-25 LAB — PSA: PSA: 0.79 ng/mL (ref 0.10–4.00)

## 2013-06-25 LAB — HEPATIC FUNCTION PANEL
ALT: 84 U/L — ABNORMAL HIGH (ref 0–53)
AST: 64 U/L — ABNORMAL HIGH (ref 0–37)
Albumin: 3.9 g/dL (ref 3.5–5.2)
Total Bilirubin: 0.7 mg/dL (ref 0.3–1.2)
Total Protein: 6.4 g/dL (ref 6.0–8.3)

## 2013-06-25 LAB — BASIC METABOLIC PANEL
BUN: 18 mg/dL (ref 6–23)
CO2: 30 mEq/L (ref 19–32)
Calcium: 9.3 mg/dL (ref 8.4–10.5)
Chloride: 104 mEq/L (ref 96–112)
Creatinine, Ser: 1 mg/dL (ref 0.4–1.5)
GFR: 82.98 mL/min (ref >60.00)
Glucose, Bld: 103 mg/dL — ABNORMAL HIGH (ref 70–99)
Potassium: 5 mEq/L (ref 3.5–5.1)
Sodium: 137 mEq/L (ref 135–145)

## 2013-06-25 LAB — CBC WITH DIFFERENTIAL/PLATELET
Basophils Relative: 0.5 % (ref 0.0–3.0)
Eosinophils Absolute: 0.2 10*3/uL (ref 0.0–0.7)
Eosinophils Relative: 2.7 % (ref 0.0–5.0)
HCT: 47.9 % (ref 39.0–52.0)
Hemoglobin: 16.4 g/dL (ref 13.0–17.0)
Lymphs Abs: 2 10*3/uL (ref 0.7–4.0)
MCHC: 34.2 g/dL (ref 30.0–36.0)
MCV: 92.3 fl (ref 78.0–100.0)
Monocytes Absolute: 0.6 10*3/uL (ref 0.1–1.0)
Monocytes Relative: 8.8 % (ref 3.0–12.0)
Neutro Abs: 4.4 10*3/uL (ref 1.4–7.7)
Platelets: 203 10*3/uL (ref 150.0–400.0)
WBC: 7.2 10*3/uL (ref 4.5–10.5)

## 2013-06-25 LAB — LIPID PANEL
Cholesterol: 219 mg/dL — ABNORMAL HIGH (ref 0–200)
HDL: 48.5 mg/dL (ref >39.00)
Total CHOL/HDL Ratio: 5
Triglycerides: 267 mg/dL — ABNORMAL HIGH (ref 0.0–149.0)
VLDL: 53.4 mg/dL — ABNORMAL HIGH (ref 0.0–40.0)

## 2013-06-25 LAB — TSH: TSH: 4 u[IU]/mL (ref 0.35–5.50)

## 2013-06-25 LAB — LDL CHOLESTEROL, DIRECT: Direct LDL: 117.9 mg/dL

## 2013-06-30 ENCOUNTER — Ambulatory Visit (INDEPENDENT_AMBULATORY_CARE_PROVIDER_SITE_OTHER): Payer: BC Managed Care – PPO | Admitting: Internal Medicine

## 2013-06-30 ENCOUNTER — Encounter: Payer: Self-pay | Admitting: Internal Medicine

## 2013-06-30 VITALS — BP 130/88 | HR 76 | Resp 15 | Wt 255.0 lb

## 2013-06-30 DIAGNOSIS — I10 Essential (primary) hypertension: Secondary | ICD-10-CM

## 2013-06-30 DIAGNOSIS — Z23 Encounter for immunization: Secondary | ICD-10-CM

## 2013-06-30 DIAGNOSIS — E785 Hyperlipidemia, unspecified: Secondary | ICD-10-CM

## 2013-06-30 DIAGNOSIS — M549 Dorsalgia, unspecified: Secondary | ICD-10-CM

## 2013-06-30 MED ORDER — TIZANIDINE HCL 4 MG PO TABS: 4.0000 mg | ORAL_TABLET | Freq: Three times a day (TID) | ORAL | Status: DC | PRN

## 2013-06-30 MED ORDER — DICLOFENAC SODIUM 3 % TD GEL: 1.0000 "application " | Freq: Two times a day (BID) | TRANSDERMAL | Status: DC

## 2013-06-30 MED ORDER — EZETIMIBE-SIMVASTATIN 10-40 MG PO TABS: 1.0000 | ORAL_TABLET | Freq: Every day | ORAL | Status: DC

## 2013-06-30 MED ORDER — BISOPROLOL-HYDROCHLOROTHIAZIDE 5-6.25 MG PO TABS: 1.0000 | ORAL_TABLET | Freq: Every day | ORAL | Status: DC

## 2013-06-30 NOTE — Progress Notes (Signed)
Pre visit review using our clinic review tool, if applicable. No additional management support is needed unless otherwise documented below in the visit note. 

## 2013-06-30 NOTE — Patient Instructions (Signed)
The patient is instructed to continue all medications as prescribed. Schedule followup with check out clerk upon leaving the clinic  

## 2013-06-30 NOTE — Progress Notes (Signed)
Subjective:    Patient ID: Paul Morrison, male    DOB: 30-Jul-1956, 57 y.o.   MRN: [id number]  HPI Increased stress ( lost his father)    Review of Systems  HENT: Negative.   Respiratory: Negative.   Cardiovascular: Negative for chest pain, palpitations and leg swelling.  Gastrointestinal: Positive for abdominal pain and abdominal distention.  Genitourinary: Negative.   Musculoskeletal: Positive for arthralgias, back pain and joint swelling.  Neurological: Positive for weakness.  Hematological: Negative.   Psychiatric/Behavioral: Negative.    Past Medical History  Diagnosis Date  . Hyperlipidemia   . Low back pain   . Hypertension     History   Social History  . Marital Status: Married    Spouse Name: N/A    Number of Children: N/A  . Years of Education: N/A   Occupational History  . Not on file.   Social History Main Topics  . Smoking status: Former Games developer  . Smokeless tobacco: Not on file  . Alcohol Use: Yes  . Drug Use: No  . Sexual Activity: Yes   Other Topics Concern  . Not on file   Social History Narrative  . No narrative on file    Past Surgical History  Procedure Laterality Date  . Vasectomy    . Hernia repair    . Keratatomy    . Lumbar fusion    . Knee arthroscopy    . Injection knee      both knees    Family History  Problem Relation Age of Onset  . Coronary artery disease    . Aneurysm    . Hyperlipidemia Mother   . Hypertension Mother   . Asthma Mother     Allergies  Allergen Reactions  . Celecoxib     Current Outpatient Prescriptions on File Prior to Visit  Medication Sig Dispense Refill  . Cholecalciferol (VITAMIN D3) 1000 UNITS CAPS Take 2,000 Units by mouth daily.        Marland Kitchen HYDROcodone-acetaminophen (NORCO/VICODIN) 5-325 MG per tablet TAKE ONE TABLET EVERY EIGHT HOURS AS NEEDED  30 tablet  2  . meloxicam (MOBIC) 15 MG tablet TAKE ONE TABLET EVERY DAY  30 tablet  3  . MICARDIS 80 MG tablet TAKE ONE TABLET BY MOUTH  ONCE DAILY  90 tablet  1  . mometasone (NASONEX) 50 MCG/ACT nasal spray TWO SPRAYS IN EACH NOSTRIL ONCE DAILY  17 g  5  . VYTORIN 10-40 MG per tablet TAKE ONE TABLET EVERY DAY  90 tablet  3  . glucosamine-chondroitin (MAX GLUCOSAMINE CHONDROITIN) 500-400 MG tablet Take 1 tablet by mouth daily.         No current facility-administered medications on file prior to visit.    There were no vitals taken for this visit.       Objective:   Physical Exam  Nursing note and vitals reviewed. Constitutional: He appears well-developed and well-nourished.  HENT:  Head: Normocephalic and atraumatic.  Eyes: Conjunctivae are normal. Pupils are equal, round, and reactive to light.  Neck: Neck supple.  Cardiovascular: Normal rate and regular rhythm.   Pulmonary/Chest: Effort normal and breath sounds normal.  Abdominal: Soft. Bowel sounds are normal.  Genitourinary:  BPH  Musculoskeletal: He exhibits tenderness.  lordosis          Assessment & Plan:   Patient presents for yearly preventative medicine examination.   all immunizations and health maintenance protocols were reviewed with the patient and they are up to date  with these protocols.   screening laboratory values were reviewed with the patient including screening of hyperlipidemia PSA renal function and hepatic function.   There medications past medical history social history problem list and allergies were reviewed in detail.   Goals were established with regard to weight loss exercise diet in compliance with medications  ED Stopped the blood pressure medications and this helped but blood pressure was elevated. Change to ziac trial of diclofenac gel to back  Zanaflex for spasm

## 2013-07-10 ENCOUNTER — Telehealth: Payer: Self-pay | Admitting: Internal Medicine

## 2013-07-10 NOTE — Telephone Encounter (Signed)
Pt needs new rx hydrocodone °

## 2013-07-11 ENCOUNTER — Other Ambulatory Visit: Payer: Self-pay | Admitting: *Deleted

## 2013-07-11 MED ORDER — HYDROCODONE-ACETAMINOPHEN 5-325 MG PO TABS: ORAL_TABLET | ORAL | Status: DC

## 2013-07-11 NOTE — Telephone Encounter (Signed)
Printed and will call pt to pick up after dr jenkins signs 

## 2013-08-29 ENCOUNTER — Encounter: Payer: Self-pay | Admitting: Family Medicine

## 2013-08-29 ENCOUNTER — Ambulatory Visit (INDEPENDENT_AMBULATORY_CARE_PROVIDER_SITE_OTHER): Payer: BC Managed Care – PPO | Admitting: Family Medicine

## 2013-08-29 VITALS — BP 140/74 | HR 62 | Temp 97.3°F | Wt 277.0 lb

## 2013-08-29 DIAGNOSIS — L03319 Cellulitis of trunk, unspecified: Secondary | ICD-10-CM

## 2013-08-29 DIAGNOSIS — L02213 Cutaneous abscess of chest wall: Secondary | ICD-10-CM

## 2013-08-29 DIAGNOSIS — L02219 Cutaneous abscess of trunk, unspecified: Secondary | ICD-10-CM

## 2013-08-29 DIAGNOSIS — I10 Essential (primary) hypertension: Secondary | ICD-10-CM

## 2013-08-29 MED ORDER — LOSARTAN POTASSIUM-HCTZ 50-12.5 MG PO TABS: 1.0000 | ORAL_TABLET | Freq: Every day | ORAL | Status: DC

## 2013-08-29 NOTE — Progress Notes (Signed)
Pre visit review using our clinic review tool, if applicable. No additional management support is needed unless otherwise documented below in the visit note. 

## 2013-08-29 NOTE — Patient Instructions (Signed)
Keep wound dry until follow up.

## 2013-08-29 NOTE — Progress Notes (Signed)
   Subjective:    Patient ID: Paul Morrison, male    DOB: [birth year], 57 y.o.   MRN: [id number]  HPI Patient seen for the following issues  He has acute issue of progressive pain, erythema, and swelling left chest wall cystic mass which has been present for several years. This started becoming tender couple weeks ago. No fevers or chills. No drainage. He has been told in the past this is a sebaceous cyst.  Patient has hypertension. Currently takes bisoprolol HCTZ. Blood pressure very poorly controlled. Several recent readings around 164/100. He also has some issues with erectile dysfunction and was concerned medication was making this worse. No headaches. No chest pains. No dizziness. Compliant with medications.  Past Medical History  Diagnosis Date  . Hyperlipidemia   . Low back pain   . Hypertension    Past Surgical History  Procedure Laterality Date  . Vasectomy    . Hernia repair    . Keratatomy    . Lumbar fusion    . Knee arthroscopy    . Injection knee      both knees    reports that he has quit smoking. He does not have any smokeless tobacco history on file. He reports that he drinks alcohol. He reports that he does not use illicit drugs. family history includes Aneurysm in an other family member; Asthma in his mother; Coronary artery disease in an other family member; Hyperlipidemia in his mother; Hypertension in his mother. Allergies  Allergen Reactions  . Celecoxib       Review of Systems  Constitutional: Negative for fever, chills and fatigue.  Eyes: Negative for visual disturbance.  Respiratory: Negative for cough, chest tightness and shortness of breath.   Cardiovascular: Negative for chest pain, palpitations and leg swelling.  Endocrine: Negative for polydipsia and polyuria.  Neurological: Negative for dizziness, syncope, weakness, light-headedness and headaches.       Objective:   Physical Exam  Constitutional: He appears well-developed and  well-nourished.  Neck: Neck supple. No thyromegaly present.  Cardiovascular: Normal rate.   Pulmonary/Chest: Effort normal and breath sounds normal. No respiratory distress. He has no wheezes. He has no rales.  Patient has large fluctuant area left upper chest wall. He has area of erythema about 2 x 3 cm with fluctuant center. Moderately tender to palpation. Minimal warmth.  Musculoskeletal: He exhibits no edema.          Assessment & Plan:  #1 abscess left upper chest wall. We recommended incision and drainage and patient consented. Area prepped with Betadine. Anesthesia 1% plain Xylocaine. Linear incision with #11 blade. We expressed copious amounts of foul-smelling purulent secretions. He had contents of sebaceous cysts which were expressed. Wound cavity irrigated with normal saline. Packing with iodoform gauze. Return in 3 days for packing removal. outer dressing applied. #2 hypertension. Poorly controlled. Change blood pressure medication to losartan HCTZ 50/12.5 mg 1 daily. Reassess blood pressure at followup.

## 2013-09-01 ENCOUNTER — Ambulatory Visit (INDEPENDENT_AMBULATORY_CARE_PROVIDER_SITE_OTHER): Payer: BC Managed Care – PPO | Admitting: Family Medicine

## 2013-09-01 ENCOUNTER — Other Ambulatory Visit: Payer: Self-pay | Admitting: Family Medicine

## 2013-09-01 ENCOUNTER — Encounter: Payer: Self-pay | Admitting: Family Medicine

## 2013-09-01 VITALS — BP 134/80 | HR 68 | Temp 97.4°F

## 2013-09-01 DIAGNOSIS — R5383 Other fatigue: Secondary | ICD-10-CM

## 2013-09-01 DIAGNOSIS — L02219 Cutaneous abscess of trunk, unspecified: Secondary | ICD-10-CM

## 2013-09-01 DIAGNOSIS — R5381 Other malaise: Secondary | ICD-10-CM

## 2013-09-01 DIAGNOSIS — M545 Low back pain, unspecified: Secondary | ICD-10-CM

## 2013-09-01 DIAGNOSIS — L02213 Cutaneous abscess of chest wall: Secondary | ICD-10-CM

## 2013-09-01 DIAGNOSIS — I10 Essential (primary) hypertension: Secondary | ICD-10-CM

## 2013-09-01 DIAGNOSIS — L03319 Cellulitis of trunk, unspecified: Secondary | ICD-10-CM

## 2013-09-01 DIAGNOSIS — E291 Testicular hypofunction: Secondary | ICD-10-CM

## 2013-09-01 DIAGNOSIS — N529 Male erectile dysfunction, unspecified: Secondary | ICD-10-CM

## 2013-09-01 MED ORDER — SILDENAFIL CITRATE 100 MG PO TABS: 50.0000 mg | ORAL_TABLET | Freq: Every day | ORAL | Status: DC | PRN

## 2013-09-01 MED ORDER — HYDROCODONE-ACETAMINOPHEN 5-325 MG PO TABS: ORAL_TABLET | ORAL | Status: DC

## 2013-09-01 NOTE — Progress Notes (Signed)
Subjective:    Patient ID: Paul Morrison, male    DOB: [birth year], 58 y.o.   MRN: [id number]  HPI Patient seen for followup several items as below: Left upper anterior chest wall abscess. We drained a significant amount of pus as this was abscessed sebaceous cyst. He's not any pain over the weekend. He changed outer dressing once secondary to drainage. No fevers or chills.  Hypertension. Erectile dysfunction. Patient had poorly controlled blood pressure on bisoprolol and we changed to losartan HCTZ. He never has started the new medication yet. He has previously been noted to have slightly low testosterone levels. He took replacement for a few months but had 15 pounds weight gain and stopped on his own. During that time, he did not see resolution of his erectile dysfunction. He would like to consider rechecking testosterone levels. He also would like to consider trial of Viagra or Cialis. His libido is fair.  Has chronic low back pain with previous fusion. Requesting refill of hydrocodone. He uses low dose about one tablet daily for severe back pain.  Past Medical History  Diagnosis Date  . Hyperlipidemia   . Low back pain   . Hypertension    Past Surgical History  Procedure Laterality Date  . Vasectomy    . Hernia repair    . Keratatomy    . Lumbar fusion    . Knee arthroscopy    . Injection knee      both knees    reports that he has quit smoking. He does not have any smokeless tobacco history on file. He reports that he drinks alcohol. He reports that he does not use illicit drugs. family history includes Aneurysm in an other family member; Asthma in his mother; Coronary artery disease in an other family member; Hyperlipidemia in his mother; Hypertension in his mother. Allergies  Allergen Reactions  . Celecoxib       Review of Systems  Constitutional: Negative for fever, chills and fatigue.  Eyes: Negative for visual disturbance.  Respiratory: Negative for cough, chest  tightness and shortness of breath.   Cardiovascular: Negative for chest pain, palpitations and leg swelling.  Musculoskeletal: Positive for back pain.  Neurological: Negative for dizziness, syncope, weakness, light-headedness and headaches.       Objective:   Physical Exam  Constitutional: He appears well-developed and well-nourished.  Cardiovascular: Normal rate.   Pulmonary/Chest: Effort normal and breath sounds normal. No respiratory distress. He has no wheezes. He has no rales.  Musculoskeletal: He exhibits no edema.  Skin:  Packing is removed from left upper anterior chest wound. He has a few remnants of sebaceous cyst sac. No purulent drainage. Wound cavity was irrigated with hydrogen peroxide. We repacked cavity with iodoform gauze and outer dressing applied          Assessment & Plan:  #1 abscess left upper anterior chest wall. Improving. No cellulitis changes. Re-packed wound cavity and he will remove this in 2 days and no further packing required that time. Keep clean with soap and moderate. #2 hypertension. Currently stable. He will consider change to losartan HCTZ #3 history of hypogonadism. Last checked over 2 years ago. Ordered total testosterone level which he will get early morning later this week. #4 erectile dysfunction. Possibly related to #3. He previously did not see resolution of this problem with testosterone replacement. Trial of Viagra 100 mg one half to one tablet daily as needed. #5 chronic low back pain. Refilled hydrocodone in absence of his  primary physician.

## 2013-09-01 NOTE — Progress Notes (Signed)
Pre visit review using our clinic review tool, if applicable. No additional management support is needed unless otherwise documented below in the visit note. 

## 2013-09-01 NOTE — Patient Instructions (Signed)
Pull packing in 2 days then clean with soap and water. You had an infected sebaceous or epidermal cyst.

## 2013-09-02 ENCOUNTER — Telehealth: Payer: Self-pay | Admitting: Internal Medicine

## 2013-09-02 NOTE — Telephone Encounter (Signed)
Relevant patient education assigned to patient using Emmi. ° °

## 2013-09-04 ENCOUNTER — Other Ambulatory Visit: Payer: BC Managed Care – PPO

## 2013-09-04 ENCOUNTER — Other Ambulatory Visit (INDEPENDENT_AMBULATORY_CARE_PROVIDER_SITE_OTHER): Payer: BC Managed Care – PPO

## 2013-09-04 DIAGNOSIS — R5381 Other malaise: Secondary | ICD-10-CM

## 2013-09-04 DIAGNOSIS — R5383 Other fatigue: Secondary | ICD-10-CM

## 2013-09-04 LAB — TESTOSTERONE: Testosterone: 289.49 ng/dL — ABNORMAL LOW (ref 350.00–890.00)

## 2013-09-08 ENCOUNTER — Telehealth: Payer: Self-pay

## 2013-09-08 MED ORDER — TESTOSTERONE 30 MG/ACT TD SOLN: TRANSDERMAL | Status: DC

## 2013-09-08 NOTE — Telephone Encounter (Signed)
Pt wants to know do you want him to go back on the testosterone that he was using in the past or try something different.  Medication    Testosterone 30 MG/ACT SOLN (Discontinued) 90 mL 4 02/16/2011 06/30/2013      Sig - Route: Place 1 Applicatorful onto the skin daily. - Transdermal      Class: Print

## 2013-09-08 NOTE — Telephone Encounter (Signed)
I would go back up what he has used previously

## 2013-09-08 NOTE — Telephone Encounter (Signed)
Faxed RX to pharmacy and pt is aware.

## 2013-10-23 ENCOUNTER — Telehealth: Payer: Self-pay | Admitting: Internal Medicine

## 2013-10-23 NOTE — Telephone Encounter (Signed)
Pt request rx HYDROcodone-acetaminophen (NORCO/VICODIN) 5-325 MG per tablet Pt is going to [facility]. April 4 and will be gone for 26 days. Needs to take med w/ him to last.

## 2013-10-24 MED ORDER — HYDROCODONE-ACETAMINOPHEN 5-325 MG PO TABS: ORAL_TABLET | ORAL | Status: DC

## 2013-10-24 NOTE — Telephone Encounter (Signed)
Correction rx will be ready for p/u on Monday, pt aware

## 2013-10-24 NOTE — Telephone Encounter (Signed)
rx up front for p/u, pt aware

## 2013-10-29 ENCOUNTER — Other Ambulatory Visit: Payer: Self-pay | Admitting: Family Medicine

## 2013-10-29 ENCOUNTER — Other Ambulatory Visit: Payer: Self-pay

## 2013-11-26 ENCOUNTER — Other Ambulatory Visit: Payer: Self-pay | Admitting: Internal Medicine

## 2013-12-15 ENCOUNTER — Telehealth: Payer: Self-pay | Admitting: Internal Medicine

## 2013-12-15 MED ORDER — HYDROCODONE-ACETAMINOPHEN 5-325 MG PO TABS: ORAL_TABLET | ORAL | Status: DC

## 2013-12-15 NOTE — Telephone Encounter (Signed)
yes

## 2013-12-15 NOTE — Telephone Encounter (Signed)
Pt is wanting to know if dr. Elease Hashimoto will accept him as a pt, pt states he has seen him several times, and is wanting him to be his primary pcp if its ok with dr. Arnoldo Morale.

## 2013-12-15 NOTE — Telephone Encounter (Signed)
Pt will call back and schedule appt with dr. Elease Hashimoto.

## 2013-12-15 NOTE — Telephone Encounter (Signed)
Pt is needing new rx for HYDROcodone-acetaminophen (NORCO/VICODIN) 5-325 MG per tablet, please call when available for pick up. ° °

## 2013-12-15 NOTE — Telephone Encounter (Signed)
Patient notified that rx is ready for pick up

## 2013-12-25 ENCOUNTER — Other Ambulatory Visit: Payer: Self-pay | Admitting: Internal Medicine

## 2013-12-30 ENCOUNTER — Encounter: Payer: Self-pay | Admitting: Gastroenterology

## 2014-01-01 ENCOUNTER — Ambulatory Visit (INDEPENDENT_AMBULATORY_CARE_PROVIDER_SITE_OTHER): Payer: BC Managed Care – PPO | Admitting: Family Medicine

## 2014-01-01 ENCOUNTER — Encounter: Payer: Self-pay | Admitting: Family Medicine

## 2014-01-01 VITALS — BP 132/80 | HR 82 | Temp 97.5°F | Wt 274.0 lb

## 2014-01-01 DIAGNOSIS — S91209A Unspecified open wound of unspecified toe(s) with damage to nail, initial encounter: Secondary | ICD-10-CM

## 2014-01-01 DIAGNOSIS — S91109A Unspecified open wound of unspecified toe(s) without damage to nail, initial encounter: Secondary | ICD-10-CM

## 2014-01-01 NOTE — Progress Notes (Signed)
   Subjective:    Patient ID: Paul Morrison, male    DOB: [birth year], 58 y.o.   MRN: [id number]  Toe Pain    Patient seen with left great toe pain. About one month ago he was traveling and in his sleep accidentally kicked a wall. This was followed by some bleeding of the toenail and now loosening of the nail with associated pain. No signs of secondary infection. Denies any bony pain.  History of hypogonadism. He is taken Axeron but because of cost issues would like to explore other options. Is not sure what alternatives his insurance offers at lower cost.  Past Medical History  Diagnosis Date  . Hyperlipidemia   . Low back pain   . Hypertension    Past Surgical History  Procedure Laterality Date  . Vasectomy    . Hernia repair    . Keratatomy    . Lumbar fusion    . Knee arthroscopy    . Injection knee      both knees    reports that he has quit smoking. He does not have any smokeless tobacco history on file. He reports that he drinks alcohol. He reports that he does not use illicit drugs. family history includes Aneurysm in an other family member; Asthma in his mother; Coronary artery disease in an other family member; Hyperlipidemia in his mother; Hypertension in his mother. Allergies  Allergen Reactions  . Celecoxib       Review of Systems  Constitutional: Negative for fever and chills.       Objective:   Physical Exam  Constitutional: He appears well-developed and well-nourished.  Cardiovascular: Normal rate.   Musculoskeletal:  Left great toe reveals loosened nail on all but the medial border. No signs of erythema or swelling. Nontender          Assessment & Plan:  Traumatic injury left great toenail. This is mostly avulsed and attached on one side only.  We discussed risks and benefits of digital block and removal of nail and patient consented.  Digital block with 1 % Plain Xylocaine.  Prepped toe with betadine and removed attached portion with hemostats.   Minimal bleeding.  Pt tolerated well.  Wound care instruction given.

## 2014-01-01 NOTE — Patient Instructions (Signed)
Keep wound dry for the first 24 hours then clean daily with soap and water for one week. Apply topical antibiotic daily for 3-4 days. Keep covered with clean dressing for 4-5 days. Follow up promptly for any signs of infection such as redness, warmth, pain, or drainage.  

## 2014-01-01 NOTE — Progress Notes (Signed)
Pre visit review using our clinic review tool, if applicable. No additional management support is needed unless otherwise documented below in the visit note. 

## 2014-02-16 ENCOUNTER — Telehealth: Payer: Self-pay | Admitting: Family Medicine

## 2014-02-16 NOTE — Telephone Encounter (Signed)
Pt needs new rx hydrocodone °

## 2014-02-16 NOTE — Telephone Encounter (Signed)
Last visit 01/01/14 Last refill 12/15/13 #30 0 refill

## 2014-02-17 NOTE — Telephone Encounter (Signed)
Refill OK

## 2014-02-18 MED ORDER — HYDROCODONE-ACETAMINOPHEN 5-325 MG PO TABS: ORAL_TABLET | ORAL | Status: DC

## 2014-02-18 NOTE — Telephone Encounter (Signed)
Left message on Vm that RX is ready for pickup 

## 2014-03-27 ENCOUNTER — Other Ambulatory Visit: Payer: Self-pay | Admitting: Family Medicine

## 2014-04-20 ENCOUNTER — Other Ambulatory Visit: Payer: Self-pay | Admitting: Internal Medicine

## 2014-04-21 ENCOUNTER — Telehealth: Payer: Self-pay | Admitting: Family Medicine

## 2014-04-21 NOTE — Telephone Encounter (Signed)
Pt went to pick up Axiron and it was $600.00. He would like a med change. The Diclofenac was $800.00 and he did not not think that was correct either. He did recently change insurances to [place].

## 2014-04-21 NOTE — Telephone Encounter (Signed)
He will need to find out from his insurance plan what less expensive option is for testosterone replacement (eg testim, androgel, etc)

## 2014-04-22 NOTE — Telephone Encounter (Signed)
Left detailed message on patient VM

## 2014-05-14 ENCOUNTER — Telehealth: Payer: Self-pay | Admitting: Family Medicine

## 2014-05-14 MED ORDER — HYDROCODONE-ACETAMINOPHEN 5-325 MG PO TABS: ORAL_TABLET | ORAL | Status: DC

## 2014-05-14 NOTE — Telephone Encounter (Signed)
Pt new rx hydrocodone °

## 2014-05-14 NOTE — Telephone Encounter (Signed)
Pt aware that Rx is ready for pick up 

## 2014-06-11 ENCOUNTER — Telehealth: Payer: Self-pay | Admitting: Family Medicine

## 2014-06-11 NOTE — Telephone Encounter (Signed)
Pt can not afford axiron and would like generic testosterone also pt would like to try atorvastatin #30 sent to [facility]. vytorin is not affordable. Pt has high deductible

## 2014-06-14 NOTE — Telephone Encounter (Signed)
May D/C Vytorin and start Atorvastatin 40 mg once daily Will need repeat lipids and hepatic in about 8 weeks  Please be more specific regarding testosterone.  Is his coverage for generic injectable- ie Depotestosterone or does he have better coverage for another topical?

## 2014-06-15 ENCOUNTER — Other Ambulatory Visit: Payer: Self-pay | Admitting: Family Medicine

## 2014-06-15 DIAGNOSIS — E785 Hyperlipidemia, unspecified: Secondary | ICD-10-CM

## 2014-06-15 MED ORDER — ATORVASTATIN CALCIUM 40 MG PO TABS: 40.0000 mg | ORAL_TABLET | Freq: Every day | ORAL | Status: DC

## 2014-06-15 NOTE — Telephone Encounter (Signed)
Rx changed and sent to pharmacy. Pt is aware. Pt will call back with information on testosterone. Lab is ordered.

## 2014-06-16 ENCOUNTER — Telehealth: Payer: Self-pay | Admitting: Family Medicine

## 2014-06-16 NOTE — Telephone Encounter (Signed)
Pt was given a couple of choices for his testosterone med [employer] phone number  [phone]  testost cyp mv  128ml   (least expensive) natesto-v nasal gel  (most expensive) testost 50 ml 1% testost pump gel 1% 12.5 ml  All need prior auth

## 2014-06-18 ENCOUNTER — Other Ambulatory Visit: Payer: Self-pay | Admitting: Family Medicine

## 2014-06-18 ENCOUNTER — Telehealth: Payer: Self-pay

## 2014-06-18 DIAGNOSIS — R7989 Other specified abnormal findings of blood chemistry: Secondary | ICD-10-CM

## 2014-06-18 MED ORDER — TESTOSTERONE 12.5 MG/ACT (1%) TD GEL: TRANSDERMAL | Status: DC

## 2014-06-18 MED ORDER — TESTOSTERONE 20.25 MG/ACT (1.62%) TD GEL: TRANSDERMAL | Status: DC

## 2014-06-18 NOTE — Telephone Encounter (Signed)
Androgel 1% no longer made in the pump. Androgel 1.62% is what the pharmacist suggest. Is it okay to resend. And the directions.

## 2014-06-18 NOTE — Telephone Encounter (Signed)
Pt informed. Rx faxed to pharmacy

## 2014-06-18 NOTE — Telephone Encounter (Signed)
Androgel 1.62% ONE pump spray per arm

## 2014-06-18 NOTE — Telephone Encounter (Signed)
We can try Androgel 1% spray pump- two pump sprays per arm once daily and will need to check testosterone level in 1-2 months.

## 2014-06-18 NOTE — Telephone Encounter (Signed)
Faxed Rx to pharmacy  

## 2014-08-10 ENCOUNTER — Telehealth: Payer: Self-pay | Admitting: Family Medicine

## 2014-08-10 NOTE — Telephone Encounter (Signed)
Hydrocodone  Last visit 01/01/14 Last refill 05/14/14 #30 0 refill

## 2014-08-10 NOTE — Telephone Encounter (Signed)
Refill hydrocodone once D/c Androgel pump from list Testosterone 50mg /5gm 1% pack apply daily-fill for one month but needs follow up office visit, PSA, CBC, etc

## 2014-08-10 NOTE — Telephone Encounter (Addendum)
Pt needs new rx hydrocodone and pt would like  try testosterone  1% individual packet call into [facility]

## 2014-08-11 MED ORDER — HYDROCODONE-ACETAMINOPHEN 5-325 MG PO TABS: ORAL_TABLET | ORAL | Status: DC

## 2014-08-11 MED ORDER — TESTOSTERONE 50 MG/5GM (1%) TD GEL: 5.0000 g | Freq: Every day | TRANSDERMAL | Status: DC

## 2014-08-11 NOTE — Telephone Encounter (Signed)
Pt is aware that RX's is ready for pickup

## 2014-08-12 ENCOUNTER — Telehealth: Payer: Self-pay | Admitting: Family Medicine

## 2014-08-12 NOTE — Telephone Encounter (Signed)
Pt presented with an rx for androgel 1%. This is no longer made. The most recent rx was for 1.62%, one pump per arm daily making the total dose 40.5mg . Dispense 75gms.

## 2014-08-13 NOTE — Telephone Encounter (Signed)
HE needs to get a more definitive answer from his insurance regarding what they will cover and we can go from there.

## 2014-08-14 MED ORDER — TESTOSTERONE 50 MG/5GM (1%) TD GEL: 5.0000 g | Freq: Every day | TRANSDERMAL | Status: DC

## 2014-08-14 NOTE — Telephone Encounter (Signed)
Go ahead and fill.

## 2014-08-14 NOTE — Telephone Encounter (Signed)
I called patient's plan and got the Androgel  50 mg/5gm approved.  Quantity 150 per 30 days.

## 2014-08-14 NOTE — Telephone Encounter (Signed)
Rx faxed to pharmacy  

## 2014-08-14 NOTE — Telephone Encounter (Signed)
Pt wants to go back on the most recent Androgel 1.62% is okay to call in with refills.

## 2014-09-08 ENCOUNTER — Telehealth: Payer: Self-pay | Admitting: Family Medicine

## 2014-09-08 NOTE — Telephone Encounter (Signed)
Montrice please put order in for pt to have labs drawn at [facility]. Pt has an appt w/[facility] on 09-16-14

## 2014-09-08 NOTE — Telephone Encounter (Signed)
What labs does he need?

## 2014-09-08 NOTE — Telephone Encounter (Signed)
The ones in our system

## 2014-09-09 ENCOUNTER — Other Ambulatory Visit (INDEPENDENT_AMBULATORY_CARE_PROVIDER_SITE_OTHER): Payer: Self-pay

## 2014-09-09 DIAGNOSIS — E291 Testicular hypofunction: Secondary | ICD-10-CM

## 2014-09-09 DIAGNOSIS — E785 Hyperlipidemia, unspecified: Secondary | ICD-10-CM

## 2014-09-09 DIAGNOSIS — R7989 Other specified abnormal findings of blood chemistry: Secondary | ICD-10-CM

## 2014-09-09 LAB — HEPATIC FUNCTION PANEL
ALT: 68 U/L — ABNORMAL HIGH (ref 0–53)
AST: 42 U/L — ABNORMAL HIGH (ref 0–37)
Albumin: 3.9 g/dL (ref 3.5–5.2)
Alkaline Phosphatase: 40 U/L (ref 39–117)
Bilirubin, Direct: 0.1 mg/dL (ref 0.0–0.3)
Total Bilirubin: 0.5 mg/dL (ref 0.2–1.2)
Total Protein: 6.4 g/dL (ref 6.0–8.3)

## 2014-09-09 LAB — LIPID PANEL
Cholesterol: 203 mg/dL — ABNORMAL HIGH (ref 0–200)
HDL: 39.7 mg/dL (ref >39.00)
NonHDL: 163.3
Total CHOL/HDL Ratio: 5
Triglycerides: 382 mg/dL — ABNORMAL HIGH (ref 0.0–149.0)
VLDL: 76.4 mg/dL — AB (ref 0.0–40.0)

## 2014-09-09 LAB — LDL CHOLESTEROL, DIRECT: Direct LDL: 98 mg/dL

## 2014-09-09 LAB — TESTOSTERONE: TESTOSTERONE: 282.03 ng/dL — AB (ref 300.00–890.00)

## 2014-09-09 NOTE — Telephone Encounter (Signed)
The labs are done.

## 2014-09-16 ENCOUNTER — Ambulatory Visit (INDEPENDENT_AMBULATORY_CARE_PROVIDER_SITE_OTHER): Payer: Managed Care, Other (non HMO) | Admitting: Family Medicine

## 2014-09-16 ENCOUNTER — Other Ambulatory Visit: Payer: Self-pay | Admitting: Family Medicine

## 2014-09-16 ENCOUNTER — Encounter: Payer: Self-pay | Admitting: Family Medicine

## 2014-09-16 VITALS — BP 128/80 | HR 77 | Temp 97.2°F | Wt 267.0 lb

## 2014-09-16 DIAGNOSIS — I10 Essential (primary) hypertension: Secondary | ICD-10-CM

## 2014-09-16 DIAGNOSIS — E785 Hyperlipidemia, unspecified: Secondary | ICD-10-CM

## 2014-09-16 DIAGNOSIS — M545 Low back pain, unspecified: Secondary | ICD-10-CM

## 2014-09-16 DIAGNOSIS — E291 Testicular hypofunction: Secondary | ICD-10-CM

## 2014-09-16 DIAGNOSIS — L723 Sebaceous cyst: Secondary | ICD-10-CM

## 2014-09-16 MED ORDER — METAXALONE 800 MG PO TABS: 800.0000 mg | ORAL_TABLET | Freq: Three times a day (TID) | ORAL | Status: DC | PRN

## 2014-09-16 MED ORDER — HYDROCODONE-ACETAMINOPHEN 5-325 MG PO TABS: ORAL_TABLET | ORAL | Status: DC

## 2014-09-16 MED ORDER — TESTOSTERONE CYPIONATE 200 MG/ML IM SOLN: 200.0000 mg | INTRAMUSCULAR | Status: DC

## 2014-09-16 NOTE — Progress Notes (Signed)
Pre visit review using our clinic review tool, if applicable. No additional management support is needed unless otherwise documented below in the visit note. 

## 2014-09-16 NOTE — Progress Notes (Signed)
   Subjective:    Patient ID: Paul Morrison, male    DOB: August 19, 1955, 59 y.o.   MRN: [id number]  HPI Patient here to discuss several issues as follows  Hypogonadism. He was initially on other topical preparations but had cost issues with insurance. Recently was on AndroGel 50 mg per 5 g using 5 g once daily. Recent testosterone level 282. He still has some fatigue issues. He apparently tried couple other topical forms the past but did not get to goal.  Hyperlipidemia. Treated with atorvastatin. He has history of mildly elevated liver transaminases. Previous abdominal ultrasound showed fatty liver changes. No regular alcohol consumption.  Chronic intermittent low back pain. He's tried multiple muscle relaxers but most of these are too sedating.  He frequently has sensation of muscle tension in his back. He exercises about 5-6 days per week.  Nontender cystic lesion left upper back. His wife had noted this incidentally.  Hypertension treated with losartan HCTZ. Blood pressure stable. No dizziness.  Past Medical History  Diagnosis Date  . Hyperlipidemia   . Low back pain   . Hypertension    Past Surgical History  Procedure Laterality Date  . Vasectomy    . Hernia repair    . Keratatomy    . Lumbar fusion    . Knee arthroscopy    . Injection knee      both knees    reports that he has quit smoking. He does not have any smokeless tobacco history on file. He reports that he drinks alcohol. He reports that he does not use illicit drugs. family history includes Aneurysm in an other family member; Asthma in his mother; Coronary artery disease in an other family member; Hyperlipidemia in his mother; Hypertension in his mother. Allergies  Allergen Reactions  . Celecoxib       Review of Systems  Constitutional: Positive for fatigue.  Eyes: Negative for visual disturbance.  Respiratory: Negative for cough, chest tightness and shortness of breath.   Cardiovascular: Negative for  chest pain, palpitations and leg swelling.  Musculoskeletal: Positive for back pain.  Neurological: Negative for dizziness, syncope, weakness, light-headedness and headaches.       Objective:   Physical Exam  Constitutional: He appears well-developed and well-nourished.  Neck: Neck supple. No thyromegaly present.  Cardiovascular: Normal rate and regular rhythm.  Exam reveals no gallop.   Pulmonary/Chest: Effort normal and breath sounds normal. No respiratory distress. He has no wheezes. He has no rales.  Musculoskeletal: He exhibits no edema.  Skin:  Left upper back reveals approximately 1 cm mobile benign-appearing epidermal cyst. No signs of secondary infection such as erythema, tenderness, fluctuance  Psychiatric: He has a normal mood and affect. His behavior is normal.          Assessment & Plan:  #1 hypogonadism. We discussed options. Options would include increasing dosage of topical versus injectable. He prefers to try the latter. Testosterone cypionate 200 mg per mL 1 mL once monthly and recheck testosterone level in one month along with CBC and PSA. Titrate if necessary #2 hypertension stable and at goal #3 dyslipidemia. Has elevated triglycerides but LDL to goal. Continue Lipitor #4 benign sebaceous cyst left upper back. Reassurance #5 chronic low back pain. Trial of Skelaxin 800 mg daily at bedtime when necessary. Limited hydrocodone which he uses very sparingly for severe pain #6 chronic mildly elevated liver transaminases. History of fatty liver changes. Highly advise further weight loss

## 2014-09-17 ENCOUNTER — Ambulatory Visit (INDEPENDENT_AMBULATORY_CARE_PROVIDER_SITE_OTHER): Payer: Managed Care, Other (non HMO) | Admitting: Family Medicine

## 2014-09-17 DIAGNOSIS — E291 Testicular hypofunction: Secondary | ICD-10-CM

## 2014-09-17 MED ORDER — TESTOSTERONE CYPIONATE 200 MG/ML IM SOLN
200.0000 mg | Freq: Once | INTRAMUSCULAR | Status: AC
  Administered 2014-09-17: 200 mg via INTRAMUSCULAR

## 2014-09-23 ENCOUNTER — Ambulatory Visit: Payer: Managed Care, Other (non HMO) | Admitting: Family Medicine

## 2014-09-29 ENCOUNTER — Other Ambulatory Visit: Payer: Self-pay | Admitting: Family Medicine

## 2014-10-12 ENCOUNTER — Encounter: Payer: Self-pay | Admitting: Family Medicine

## 2014-10-12 ENCOUNTER — Ambulatory Visit (INDEPENDENT_AMBULATORY_CARE_PROVIDER_SITE_OTHER): Payer: Managed Care, Other (non HMO) | Admitting: Family Medicine

## 2014-10-12 VITALS — BP 134/80 | HR 78 | Temp 97.5°F | Wt 273.0 lb

## 2014-10-12 DIAGNOSIS — L03116 Cellulitis of left lower limb: Secondary | ICD-10-CM

## 2014-10-12 DIAGNOSIS — M79672 Pain in left foot: Secondary | ICD-10-CM

## 2014-10-12 MED ORDER — CEPHALEXIN 500 MG PO CAPS: 500.0000 mg | ORAL_CAPSULE | Freq: Three times a day (TID) | ORAL | Status: DC

## 2014-10-12 NOTE — Progress Notes (Signed)
   Subjective:    Patient ID: Paul Morrison, male    DOB: 05-Sep-1955, 59 y.o.   MRN: [id number]  HPI   Patient seen with left foot pain. About 6 days ago noticed some pain mostly laterally and dorsally. Within a couple of days he noticed some swelling and redness and increased warmth.   No fever or chills. He has been applying ice without improvement. Pain is worsened over the past week. No history of injury. No history of gout. Denies any arthralgias or any other areas of warmth or erythema.  Past Medical History  Diagnosis Date  . Hyperlipidemia   . Low back pain   . Hypertension    Past Surgical History  Procedure Laterality Date  . Vasectomy    . Hernia repair    . Keratatomy    . Lumbar fusion    . Knee arthroscopy    . Injection knee      both knees    reports that he has quit smoking. He does not have any smokeless tobacco history on file. He reports that he drinks alcohol. He reports that he does not use illicit drugs. family history includes Aneurysm in an other family member; Asthma in his mother; Coronary artery disease in an other family member; Hyperlipidemia in his mother; Hypertension in his mother. Allergies  Allergen Reactions  . Celecoxib       Review of Systems  Constitutional: Negative for fever and chills.  Respiratory: Negative for shortness of breath.   Cardiovascular: Negative for chest pain.  Gastrointestinal: Negative for nausea and vomiting.       Objective:   Physical Exam  Constitutional: He appears well-developed and well-nourished.  Cardiovascular: Normal rate and regular rhythm.   Musculoskeletal:  Left foot reveals some edema especially laterally and dorsally compared to the right. He has some mild erythema and increased warmth to palpation. No specific localized bony tenderness. He generally has some mild diffuse tenderness over the fourth and fifth metatarsals. No Achilles tenderness. Full range of motion ankle. Good capillary refill.   Skin:  No obvious skin breakdown involving the foot.          Assessment & Plan:   Acute left foot pain. No injury. He has definite inflammatory changes with edema, warmth, and redness. Differential is gout versus infection/cellulitis. He has no history of gout and suspect this most likely represents cellulitis. He does have very faint erythema which appears to be extending up to the anterior ankle and up on the proximal leg. No obvious breaks in skin noted. Avoid further icing. Elevate frequently. Keflex 500 mgs 3 times a day for 10 days. Reassess in 2-3 days if not improving. Increase Aleve to one twice daily

## 2014-10-12 NOTE — Progress Notes (Signed)
Pre visit review using our clinic review tool, if applicable. No additional management support is needed unless otherwise documented below in the visit note. 

## 2014-10-12 NOTE — Patient Instructions (Signed)
Increase Aleve to twice daily Follow up for any fever or increasing redness or swelling.

## 2014-10-13 ENCOUNTER — Encounter: Payer: Self-pay | Admitting: Family Medicine

## 2014-10-14 ENCOUNTER — Other Ambulatory Visit (INDEPENDENT_AMBULATORY_CARE_PROVIDER_SITE_OTHER): Payer: Managed Care, Other (non HMO)

## 2014-10-14 DIAGNOSIS — E291 Testicular hypofunction: Secondary | ICD-10-CM

## 2014-10-14 LAB — CBC WITH DIFFERENTIAL/PLATELET
Basophils Absolute: 0 10*3/uL (ref 0.0–0.1)
Basophils Relative: 0.5 % (ref 0.0–3.0)
EOS PCT: 2.9 % (ref 0.0–5.0)
Eosinophils Absolute: 0.2 10*3/uL (ref 0.0–0.7)
HEMATOCRIT: 48.3 % (ref 39.0–52.0)
Hemoglobin: 16.7 g/dL (ref 13.0–17.0)
Lymphocytes Relative: 25.6 % (ref 12.0–46.0)
Lymphs Abs: 1.8 10*3/uL (ref 0.7–4.0)
MCHC: 34.6 g/dL (ref 30.0–36.0)
MCV: 90.2 fl (ref 78.0–100.0)
MONO ABS: 0.7 10*3/uL (ref 0.1–1.0)
Monocytes Relative: 10.4 % (ref 3.0–12.0)
NEUTROS PCT: 60.6 % (ref 43.0–77.0)
Neutro Abs: 4.3 10*3/uL (ref 1.4–7.7)
Platelets: 210 10*3/uL (ref 150.0–400.0)
RBC: 5.35 Mil/uL (ref 4.22–5.81)
RDW: 12.8 % (ref 11.5–15.5)
WBC: 7 10*3/uL (ref 4.0–10.5)

## 2014-10-14 LAB — TESTOSTERONE: TESTOSTERONE: 212.45 ng/dL — AB (ref 300.00–890.00)

## 2014-10-14 LAB — PSA: PSA: 1.16 ng/mL (ref 0.10–4.00)

## 2014-10-15 ENCOUNTER — Ambulatory Visit (INDEPENDENT_AMBULATORY_CARE_PROVIDER_SITE_OTHER): Payer: Managed Care, Other (non HMO) | Admitting: Family Medicine

## 2014-10-15 ENCOUNTER — Other Ambulatory Visit: Payer: Self-pay

## 2014-10-15 ENCOUNTER — Encounter: Payer: Self-pay | Admitting: Family Medicine

## 2014-10-15 DIAGNOSIS — E291 Testicular hypofunction: Secondary | ICD-10-CM

## 2014-10-15 DIAGNOSIS — R7989 Other specified abnormal findings of blood chemistry: Secondary | ICD-10-CM

## 2014-10-15 MED ORDER — TESTOSTERONE CYPIONATE 200 MG/ML IM SOLN
200.0000 mg | Freq: Once | INTRAMUSCULAR | Status: AC
  Administered 2014-10-15: 200 mg via INTRAMUSCULAR

## 2014-10-21 ENCOUNTER — Ambulatory Visit: Payer: Managed Care, Other (non HMO) | Admitting: Family Medicine

## 2014-10-29 ENCOUNTER — Ambulatory Visit: Payer: Managed Care, Other (non HMO) | Admitting: Family Medicine

## 2014-10-29 ENCOUNTER — Ambulatory Visit (INDEPENDENT_AMBULATORY_CARE_PROVIDER_SITE_OTHER): Payer: Managed Care, Other (non HMO) | Admitting: Family Medicine

## 2014-10-29 ENCOUNTER — Ambulatory Visit: Payer: Self-pay | Admitting: Family Medicine

## 2014-10-29 DIAGNOSIS — E291 Testicular hypofunction: Secondary | ICD-10-CM

## 2014-10-29 MED ORDER — TESTOSTERONE CYPIONATE 200 MG/ML IM SOLN
200.0000 mg | Freq: Once | INTRAMUSCULAR | Status: AC
  Administered 2014-10-29: 200 mg via INTRAMUSCULAR

## 2014-11-04 ENCOUNTER — Other Ambulatory Visit: Payer: Self-pay | Admitting: Family Medicine

## 2014-11-04 ENCOUNTER — Telehealth: Payer: Self-pay | Admitting: Family Medicine

## 2014-11-04 DIAGNOSIS — E291 Testicular hypofunction: Secondary | ICD-10-CM

## 2014-11-04 NOTE — Telephone Encounter (Signed)
Yes it is okay

## 2014-11-04 NOTE — Telephone Encounter (Signed)
Pt would like to know if he can continue to come in at 8:30 for his testosterone inj..from now on?  Is that OK?  Pt advised to have testosterone level rechecked in 2 months. Would like to go to [facility]. There is a standing order already. that ok?

## 2014-11-12 ENCOUNTER — Ambulatory Visit (INDEPENDENT_AMBULATORY_CARE_PROVIDER_SITE_OTHER): Payer: Managed Care, Other (non HMO) | Admitting: Family Medicine

## 2014-11-12 DIAGNOSIS — E291 Testicular hypofunction: Secondary | ICD-10-CM | POA: Diagnosis not present

## 2014-11-12 MED ORDER — TESTOSTERONE CYPIONATE 200 MG/ML IM SOLN
200.0000 mg | Freq: Once | INTRAMUSCULAR | Status: AC
  Administered 2014-11-12: 200 mg via INTRAMUSCULAR

## 2014-11-19 ENCOUNTER — Other Ambulatory Visit: Payer: Self-pay

## 2014-11-19 ENCOUNTER — Encounter: Payer: Self-pay | Admitting: Family Medicine

## 2014-11-19 MED ORDER — AZELASTINE HCL 0.1 % NA SOLN: 2.0000 | Freq: Every day | NASAL | Status: DC

## 2014-11-24 ENCOUNTER — Other Ambulatory Visit (INDEPENDENT_AMBULATORY_CARE_PROVIDER_SITE_OTHER): Payer: Managed Care, Other (non HMO)

## 2014-11-24 DIAGNOSIS — E291 Testicular hypofunction: Secondary | ICD-10-CM | POA: Diagnosis not present

## 2014-11-24 LAB — TESTOSTERONE: TESTOSTERONE: 362.82 ng/dL (ref 300.00–890.00)

## 2014-11-26 ENCOUNTER — Ambulatory Visit (INDEPENDENT_AMBULATORY_CARE_PROVIDER_SITE_OTHER): Payer: Managed Care, Other (non HMO) | Admitting: Family Medicine

## 2014-11-26 DIAGNOSIS — E291 Testicular hypofunction: Secondary | ICD-10-CM

## 2014-11-26 MED ORDER — TESTOSTERONE CYPIONATE 200 MG/ML IM SOLN
200.0000 mg | Freq: Once | INTRAMUSCULAR | Status: AC
  Administered 2014-11-26: 200 mg via INTRAMUSCULAR

## 2014-12-03 ENCOUNTER — Encounter: Payer: Self-pay | Admitting: Family Medicine

## 2014-12-10 ENCOUNTER — Encounter: Payer: Self-pay | Admitting: Family Medicine

## 2014-12-10 ENCOUNTER — Ambulatory Visit (INDEPENDENT_AMBULATORY_CARE_PROVIDER_SITE_OTHER): Payer: Managed Care, Other (non HMO) | Admitting: Family Medicine

## 2014-12-10 ENCOUNTER — Other Ambulatory Visit: Payer: Self-pay | Admitting: Family Medicine

## 2014-12-10 VITALS — BP 130/80 | HR 71 | Temp 97.6°F | Wt 271.0 lb

## 2014-12-10 DIAGNOSIS — E291 Testicular hypofunction: Secondary | ICD-10-CM | POA: Diagnosis not present

## 2014-12-10 DIAGNOSIS — R5383 Other fatigue: Secondary | ICD-10-CM

## 2014-12-10 MED ORDER — TESTOSTERONE CYPIONATE 200 MG/ML IM SOLN
200.0000 mg | Freq: Once | INTRAMUSCULAR | Status: AC
  Administered 2014-12-10: 200 mg via INTRAMUSCULAR

## 2014-12-10 NOTE — Progress Notes (Signed)
   Subjective:    Patient ID: Paul Morrison, male    DOB: September 11, 1955, 59 y.o.   MRN: [id number]  HPI Patient here for testosterone injection also to discuss fatigue issues. He's had progressive fatigue over the past year. We recently started testosterone injections and recent total testosterone level 362.   Currently takes dosage of Depo- testosterone 200 mg every 2 weeks. His wife apparently is dealing with some type of chronic fatigue issues and possibly related to Epstein-Barr virus.  Patient relates fairly pervasive fatigue. He still exercising a few days per week but less exercise tolerance. No chest pains. No dyspnea. Generally sleeps about 9-10 hours per night. No history of obstructive sleep apnea. Does have sometimes daytime somnolence. No observed apnea episodes. Previous thyroid function testing normal. Occasional tingling sensation and numbness in extremities. He had a daughter diagnosed with severe B12 deficiency  Past Medical History  Diagnosis Date  . Hyperlipidemia   . Low back pain   . Hypertension    Past Surgical History  Procedure Laterality Date  . Vasectomy    . Hernia repair    . Keratatomy    . Lumbar fusion    . Knee arthroscopy    . Injection knee      both knees    reports that he has quit smoking. He does not have any smokeless tobacco history on file. He reports that he drinks alcohol. He reports that he does not use illicit drugs. family history includes Aneurysm in an other family member; Asthma in his mother; Coronary artery disease in an other family member; Hyperlipidemia in his mother; Hypertension in his mother. Allergies  Allergen Reactions  . Celecoxib       Review of Systems  Constitutional: Positive for fatigue. Negative for fever, chills, appetite change and unexpected weight change.  HENT: Negative for trouble swallowing.   Respiratory: Negative for cough and shortness of breath.   Cardiovascular: Negative for chest pain.    Gastrointestinal: Negative for abdominal pain.  Genitourinary: Negative for dysuria.  Neurological: Negative for dizziness and headaches.  Psychiatric/Behavioral: Negative for dysphoric mood.       Objective:   Physical Exam  Constitutional: He appears well-developed and well-nourished.  HENT:  Right Ear: External ear normal.  Left Ear: External ear normal.  Mouth/Throat: Oropharynx is clear and moist.  Neck: Neck supple. No thyromegaly present.  Cardiovascular: Normal rate and regular rhythm.  Exam reveals no gallop.   No murmur heard. Pulmonary/Chest: Effort normal and breath sounds normal. No respiratory distress. He has no wheezes. He has no rales.  Musculoskeletal: He exhibits no edema.  Lymphadenopathy:    He has no cervical adenopathy.  Psychiatric: He has a normal mood and affect. His behavior is normal.          Assessment & Plan:  Fatigue. This has persisted with recent testosterone replacement even though he is in low range of normal. Check labs with B12 and TSH. Epworth Sleepiness Scale score of 7. Doubt obstructive sleep apnea. If labs above normal consider increasing his testosterone slightly. Would also consider possible sleep study if symptoms persist

## 2014-12-10 NOTE — Progress Notes (Signed)
Pre visit review using our clinic review tool, if applicable. No additional management support is needed unless otherwise documented below in the visit note. 

## 2014-12-11 ENCOUNTER — Other Ambulatory Visit (INDEPENDENT_AMBULATORY_CARE_PROVIDER_SITE_OTHER): Payer: Managed Care, Other (non HMO)

## 2014-12-11 DIAGNOSIS — R5383 Other fatigue: Secondary | ICD-10-CM | POA: Diagnosis not present

## 2014-12-11 LAB — VITAMIN B12: Vitamin B-12: 215 pg/mL (ref 211–911)

## 2014-12-11 LAB — TSH: TSH: 2.93 u[IU]/mL (ref 0.35–4.50)

## 2014-12-14 ENCOUNTER — Encounter: Payer: Self-pay | Admitting: Family Medicine

## 2014-12-15 ENCOUNTER — Telehealth: Payer: Self-pay | Admitting: *Deleted

## 2014-12-15 NOTE — Telephone Encounter (Signed)
I spoke with the pt and informed him of the test results from 5/13.

## 2014-12-23 ENCOUNTER — Telehealth: Payer: Self-pay | Admitting: Family Medicine

## 2014-12-23 MED ORDER — HYDROCODONE-ACETAMINOPHEN 5-325 MG PO TABS
ORAL_TABLET | ORAL | Status: DC
Start: 2014-12-23 — End: 2015-02-23

## 2014-12-23 NOTE — Telephone Encounter (Signed)
Last visit 12/10/14 Last refill 09/16/14 #30 0 refill

## 2014-12-23 NOTE — Telephone Encounter (Signed)
Pt needs new rx hydrocodone °

## 2014-12-23 NOTE — Telephone Encounter (Signed)
Refill once 

## 2014-12-23 NOTE — Telephone Encounter (Signed)
Left message on [facility] that Rx is ready for pickup

## 2014-12-24 ENCOUNTER — Ambulatory Visit: Payer: Self-pay | Admitting: Family Medicine

## 2014-12-24 ENCOUNTER — Ambulatory Visit (INDEPENDENT_AMBULATORY_CARE_PROVIDER_SITE_OTHER): Payer: Managed Care, Other (non HMO) | Admitting: Family Medicine

## 2014-12-24 DIAGNOSIS — E291 Testicular hypofunction: Secondary | ICD-10-CM

## 2014-12-24 MED ORDER — TESTOSTERONE CYPIONATE 200 MG/ML IM SOLN
300.0000 mg | Freq: Once | INTRAMUSCULAR | Status: AC
  Administered 2014-12-24: 300 mg via INTRAMUSCULAR

## 2015-01-07 ENCOUNTER — Ambulatory Visit: Payer: Managed Care, Other (non HMO) | Admitting: Family Medicine

## 2015-01-07 DIAGNOSIS — N529 Male erectile dysfunction, unspecified: Secondary | ICD-10-CM

## 2015-01-07 DIAGNOSIS — E291 Testicular hypofunction: Secondary | ICD-10-CM

## 2015-01-07 MED ORDER — "NEEDLE (DISP) 21G X 1-1/2"" MISC": 1.0000 | Status: DC

## 2015-01-07 MED ORDER — TESTOSTERONE CYPIONATE 200 MG/ML IM SOLN: 300.0000 mg | Freq: Once | INTRAMUSCULAR | Status: DC

## 2015-01-12 ENCOUNTER — Telehealth: Payer: Self-pay | Admitting: Family Medicine

## 2015-01-12 NOTE — Telephone Encounter (Signed)
Vanessa-Admin Team Lead called from [employer] inquiring about order for patient. Pt called them to sched his injection to be done there. Office needs clearance on doing so.

## 2015-01-12 NOTE — Telephone Encounter (Signed)
Is it okay for me to put standing orders in for the patient to receive his injections at the [place] office. It is closer to his job.

## 2015-01-12 NOTE — Telephone Encounter (Signed)
yes

## 2015-01-13 ENCOUNTER — Other Ambulatory Visit: Payer: Self-pay | Admitting: Family Medicine

## 2015-01-13 DIAGNOSIS — E291 Testicular hypofunction: Secondary | ICD-10-CM

## 2015-01-13 MED ORDER — TESTOSTERONE CYPIONATE 200 MG/ML IM SOLN: 300.0000 mg | INTRAMUSCULAR | Status: DC

## 2015-01-13 NOTE — Telephone Encounter (Signed)
Injections are ordered. Lorriane Shire is aware

## 2015-01-21 ENCOUNTER — Ambulatory Visit: Payer: Managed Care, Other (non HMO) | Admitting: Family Medicine

## 2015-01-21 ENCOUNTER — Ambulatory Visit: Payer: Managed Care, Other (non HMO)

## 2015-01-26 ENCOUNTER — Ambulatory Visit (INDEPENDENT_AMBULATORY_CARE_PROVIDER_SITE_OTHER): Payer: Managed Care, Other (non HMO) | Admitting: *Deleted

## 2015-01-26 DIAGNOSIS — E291 Testicular hypofunction: Secondary | ICD-10-CM

## 2015-01-26 MED ORDER — TESTOSTERONE CYPIONATE 200 MG/ML IM SOLN
300.0000 mg | Freq: Once | INTRAMUSCULAR | Status: AC
  Administered 2015-01-26: 300 mg via INTRAMUSCULAR

## 2015-01-28 ENCOUNTER — Other Ambulatory Visit: Payer: Self-pay | Admitting: Family Medicine

## 2015-02-11 ENCOUNTER — Ambulatory Visit (INDEPENDENT_AMBULATORY_CARE_PROVIDER_SITE_OTHER): Payer: Managed Care, Other (non HMO) | Admitting: *Deleted

## 2015-02-11 DIAGNOSIS — E291 Testicular hypofunction: Secondary | ICD-10-CM

## 2015-02-11 MED ORDER — TESTOSTERONE CYPIONATE 100 MG/ML IM SOLN: 300.0000 mg | Freq: Once | INTRAMUSCULAR | Status: DC

## 2015-02-11 MED ORDER — TESTOSTERONE CYPIONATE 200 MG/ML IM SOLN
300.0000 mg | Freq: Once | INTRAMUSCULAR | Status: AC
  Administered 2015-02-11: 300 mg via INTRAMUSCULAR

## 2015-02-17 ENCOUNTER — Other Ambulatory Visit: Payer: Self-pay | Admitting: Family Medicine

## 2015-02-22 ENCOUNTER — Telehealth: Payer: Self-pay | Admitting: Family Medicine

## 2015-02-22 NOTE — Telephone Encounter (Signed)
Last visit 12/10/14 Last refill 12/23/14 #30 0 refill

## 2015-02-22 NOTE — Telephone Encounter (Signed)
Pt request refill of the following: HYDROcodone-acetaminophen (NORCO/VICODIN) 5-325 MG per tablet ° ° °Phamacy: °

## 2015-02-23 MED ORDER — HYDROCODONE-ACETAMINOPHEN 5-325 MG PO TABS: ORAL_TABLET | ORAL | Status: DC

## 2015-02-23 NOTE — Telephone Encounter (Signed)
Pt aware that Rx is ready for pick up 

## 2015-02-23 NOTE — Telephone Encounter (Signed)
Refill once.  Avoid regular use. 

## 2015-02-25 ENCOUNTER — Ambulatory Visit (INDEPENDENT_AMBULATORY_CARE_PROVIDER_SITE_OTHER): Payer: Managed Care, Other (non HMO) | Admitting: *Deleted

## 2015-02-25 DIAGNOSIS — E291 Testicular hypofunction: Secondary | ICD-10-CM

## 2015-02-25 MED ORDER — TESTOSTERONE CYPIONATE 200 MG/ML IM SOLN
300.0000 mg | Freq: Once | INTRAMUSCULAR | Status: AC
  Administered 2015-02-25: 300 mg via INTRAMUSCULAR

## 2015-03-11 ENCOUNTER — Ambulatory Visit (INDEPENDENT_AMBULATORY_CARE_PROVIDER_SITE_OTHER): Payer: Managed Care, Other (non HMO)

## 2015-03-11 DIAGNOSIS — E291 Testicular hypofunction: Secondary | ICD-10-CM

## 2015-03-11 MED ORDER — TESTOSTERONE CYPIONATE 200 MG/ML IM SOLN
300.0000 mg | Freq: Once | INTRAMUSCULAR | Status: AC
  Administered 2015-03-11: 300 mg via INTRAMUSCULAR

## 2015-03-15 ENCOUNTER — Encounter: Payer: Self-pay | Admitting: Family Medicine

## 2015-03-16 ENCOUNTER — Other Ambulatory Visit: Payer: Self-pay | Admitting: Family Medicine

## 2015-03-16 DIAGNOSIS — E785 Hyperlipidemia, unspecified: Secondary | ICD-10-CM

## 2015-03-16 DIAGNOSIS — E538 Deficiency of other specified B group vitamins: Secondary | ICD-10-CM

## 2015-03-16 DIAGNOSIS — E291 Testicular hypofunction: Secondary | ICD-10-CM

## 2015-03-24 ENCOUNTER — Other Ambulatory Visit (INDEPENDENT_AMBULATORY_CARE_PROVIDER_SITE_OTHER): Payer: Managed Care, Other (non HMO)

## 2015-03-24 DIAGNOSIS — R7989 Other specified abnormal findings of blood chemistry: Secondary | ICD-10-CM

## 2015-03-24 DIAGNOSIS — E291 Testicular hypofunction: Secondary | ICD-10-CM | POA: Diagnosis not present

## 2015-03-24 DIAGNOSIS — E785 Hyperlipidemia, unspecified: Secondary | ICD-10-CM | POA: Diagnosis not present

## 2015-03-24 DIAGNOSIS — E538 Deficiency of other specified B group vitamins: Secondary | ICD-10-CM | POA: Diagnosis not present

## 2015-03-24 LAB — HEPATIC FUNCTION PANEL
ALT: 35 U/L (ref 0–53)
AST: 28 U/L (ref 0–37)
Albumin: 4 g/dL (ref 3.5–5.2)
Alkaline Phosphatase: 43 U/L (ref 39–117)
BILIRUBIN DIRECT: 0.1 mg/dL (ref 0.0–0.3)
BILIRUBIN TOTAL: 0.3 mg/dL (ref 0.2–1.2)
Total Protein: 6.5 g/dL (ref 6.0–8.3)

## 2015-03-24 LAB — LIPID PANEL
CHOL/HDL RATIO: 5
Cholesterol: 199 mg/dL (ref 0–200)
HDL: 36.2 mg/dL — AB (ref >39.00)
NONHDL: 162.58
TRIGLYCERIDES: 267 mg/dL — AB (ref 0.0–149.0)
VLDL: 53.4 mg/dL — AB (ref 0.0–40.0)

## 2015-03-24 LAB — LDL CHOLESTEROL, DIRECT: Direct LDL: 111 mg/dL

## 2015-03-24 LAB — VITAMIN B12: Vitamin B-12: 432 pg/mL (ref 211–911)

## 2015-03-24 LAB — TESTOSTERONE: Testosterone: 313.71 ng/dL (ref 300.00–890.00)

## 2015-03-24 LAB — MAGNESIUM: MAGNESIUM: 2 mg/dL (ref 1.5–2.5)

## 2015-03-25 ENCOUNTER — Ambulatory Visit (INDEPENDENT_AMBULATORY_CARE_PROVIDER_SITE_OTHER): Payer: Managed Care, Other (non HMO)

## 2015-03-25 DIAGNOSIS — E291 Testicular hypofunction: Secondary | ICD-10-CM

## 2015-03-25 MED ORDER — TESTOSTERONE CYPIONATE 200 MG/ML IM SOLN
300.0000 mg | Freq: Once | INTRAMUSCULAR | Status: AC
  Administered 2015-03-25: 300 mg via INTRAMUSCULAR

## 2015-03-25 NOTE — Progress Notes (Signed)
Pt came into clinic for administration of testosterone and brought his own medication. The pt was given injection in the upper right quadrant.

## 2015-03-30 ENCOUNTER — Other Ambulatory Visit: Payer: Self-pay | Admitting: Family Medicine

## 2015-03-31 ENCOUNTER — Encounter: Payer: Self-pay | Admitting: Family Medicine

## 2015-03-31 ENCOUNTER — Ambulatory Visit (INDEPENDENT_AMBULATORY_CARE_PROVIDER_SITE_OTHER): Payer: Managed Care, Other (non HMO) | Admitting: Family Medicine

## 2015-03-31 VITALS — BP 110/80 | HR 89 | Temp 97.5°F | Wt 262.9 lb

## 2015-03-31 DIAGNOSIS — E785 Hyperlipidemia, unspecified: Secondary | ICD-10-CM | POA: Diagnosis not present

## 2015-03-31 DIAGNOSIS — E291 Testicular hypofunction: Secondary | ICD-10-CM

## 2015-03-31 DIAGNOSIS — F411 Generalized anxiety disorder: Secondary | ICD-10-CM

## 2015-03-31 DIAGNOSIS — R7989 Other specified abnormal findings of blood chemistry: Secondary | ICD-10-CM

## 2015-03-31 DIAGNOSIS — I10 Essential (primary) hypertension: Secondary | ICD-10-CM | POA: Diagnosis not present

## 2015-03-31 MED ORDER — ALPRAZOLAM 0.5 MG PO TABS: 0.5000 mg | ORAL_TABLET | Freq: Three times a day (TID) | ORAL | Status: DC | PRN

## 2015-03-31 MED ORDER — LOSARTAN POTASSIUM-HCTZ 50-12.5 MG PO TABS: 1.0000 | ORAL_TABLET | Freq: Every day | ORAL | Status: DC

## 2015-03-31 MED ORDER — TESTOSTERONE 30 MG/ACT TD SOLN: 2.0000 | Freq: Every day | TRANSDERMAL | Status: DC

## 2015-03-31 NOTE — Progress Notes (Signed)
Pre visit review using our clinic review tool, if applicable. No additional management support is needed unless otherwise documented below in the visit note. 

## 2015-03-31 NOTE — Progress Notes (Signed)
   Subjective:    Patient ID: Paul Morrison, male    DOB: 01-13-1956, 59 y.o.   MRN: [id number]  HPI Patient here to discuss multiple issues  Low testosterone. Currently doing injections-every 2 weeks. Previously used Axiron but changed to injections because of cost issues. He would now like to go back to topical. Recent testosterone level 313.  He has history of low B12 and following recent replacement does feel more energetic.  Hyperlipidemia. He skilled back Lipitor on his own but was not having any myalgias. He generally takes this about every 3 days. No history of CAD  He is dealing with tremendous stress and anxiety issues. 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 He denies depression but feels extremely anxious at times. In the past has taken Xanax short-term. He is willing to consider counseling. He has some sleep disturbance.  Hypertension treated with losartan HCTZ. Blood pressure has been stable. No dizziness. No headaches. No chest pains.  Past Medical History  Diagnosis Date  . Hyperlipidemia   . Low back pain   . Hypertension    Past Surgical History  Procedure Laterality Date  . Vasectomy    . Hernia repair    . Keratatomy    . Lumbar fusion    . Knee arthroscopy    . Injection knee      both knees    reports that he has quit smoking. He does not have any smokeless tobacco history on file. He reports that he drinks alcohol. He reports that he does not use illicit drugs. family history includes Aneurysm in an other family member; Asthma in his mother; Coronary artery disease in an other family member; Hyperlipidemia in his mother; Hypertension in his mother. Allergies  Allergen Reactions  . Celecoxib       Review of Systems  Constitutional: Negative for fatigue.  Eyes: Negative for visual disturbance.  Respiratory: Negative for cough, chest tightness and shortness of breath.   Cardiovascular: Negative for  chest pain, palpitations and leg swelling.  Gastrointestinal: Negative for abdominal pain.  Genitourinary: Negative for dysuria.  Neurological: Negative for dizziness, syncope, weakness, light-headedness and headaches.  Psychiatric/Behavioral: Positive for sleep disturbance. The patient is nervous/anxious.        Objective:   Physical Exam  Constitutional: He appears well-developed and well-nourished.  Neck: Neck supple. No thyromegaly present.  Cardiovascular: Normal rate and regular rhythm.   Pulmonary/Chest: Effort normal and breath sounds normal. No respiratory distress. He has no wheezes. He has no rales.  Neurological: He is alert.  Psychiatric: He has a normal mood and affect. His behavior is normal.          Assessment & Plan:  #1 hypertension stable and at goal. Refill Hyzaar for one year #2 hyperlipidemia. Inconsistent use of statin. Lipids stable by recent labs. Encouraged him to take Lipitor more frequently since he has no side effects #3 low testosterone. Patient requesting going back to topical.Axiron 30 mg per pump spray 2 sprays once daily and recheck total testosterone level and approximately one-month  #4 anxiety. Suspect largely situational. We strongly advocated counseling and brochure with names given We agreed to one refill only of Xanax 0.5 mg to use very sparingly. Consider SSRI if anxiety symptoms persist..

## 2015-04-07 ENCOUNTER — Encounter: Payer: Self-pay | Admitting: Family Medicine

## 2015-04-08 ENCOUNTER — Ambulatory Visit (INDEPENDENT_AMBULATORY_CARE_PROVIDER_SITE_OTHER): Payer: Managed Care, Other (non HMO)

## 2015-04-08 DIAGNOSIS — E291 Testicular hypofunction: Secondary | ICD-10-CM | POA: Diagnosis not present

## 2015-04-08 MED ORDER — TESTOSTERONE CYPIONATE 200 MG/ML IM SOLN
300.0000 mg | Freq: Once | INTRAMUSCULAR | Status: AC
  Administered 2015-04-08: 300 mg via INTRAMUSCULAR

## 2015-04-08 NOTE — Progress Notes (Signed)
Patient came in for Testosterone injection.  Received in Left upper Quadrant.  Patient tolerated well.

## 2015-04-27 ENCOUNTER — Ambulatory Visit: Payer: Managed Care, Other (non HMO) | Admitting: Licensed Clinical Social Worker

## 2015-04-28 ENCOUNTER — Ambulatory Visit (INDEPENDENT_AMBULATORY_CARE_PROVIDER_SITE_OTHER): Payer: Managed Care, Other (non HMO) | Admitting: Licensed Clinical Social Worker

## 2015-04-28 DIAGNOSIS — F331 Major depressive disorder, recurrent, moderate: Secondary | ICD-10-CM

## 2015-05-04 ENCOUNTER — Ambulatory Visit: Payer: Managed Care, Other (non HMO) | Admitting: Licensed Clinical Social Worker

## 2015-05-11 ENCOUNTER — Encounter: Payer: Self-pay | Admitting: Family Medicine

## 2015-05-11 ENCOUNTER — Ambulatory Visit (INDEPENDENT_AMBULATORY_CARE_PROVIDER_SITE_OTHER): Payer: Managed Care, Other (non HMO) | Admitting: Licensed Clinical Social Worker

## 2015-05-11 DIAGNOSIS — E349 Endocrine disorder, unspecified: Secondary | ICD-10-CM

## 2015-05-11 DIAGNOSIS — F331 Major depressive disorder, recurrent, moderate: Secondary | ICD-10-CM | POA: Diagnosis not present

## 2015-05-25 ENCOUNTER — Ambulatory Visit: Payer: Managed Care, Other (non HMO) | Admitting: Licensed Clinical Social Worker

## 2015-06-09 ENCOUNTER — Encounter: Payer: Self-pay | Admitting: Family Medicine

## 2015-06-09 ENCOUNTER — Other Ambulatory Visit (INDEPENDENT_AMBULATORY_CARE_PROVIDER_SITE_OTHER): Payer: Managed Care, Other (non HMO)

## 2015-06-09 DIAGNOSIS — E291 Testicular hypofunction: Secondary | ICD-10-CM | POA: Diagnosis not present

## 2015-06-09 DIAGNOSIS — E349 Endocrine disorder, unspecified: Secondary | ICD-10-CM

## 2015-06-09 LAB — TESTOSTERONE: Testosterone: 445.06 ng/dL (ref 300.00–890.00)

## 2015-06-18 ENCOUNTER — Telehealth: Payer: Self-pay | Admitting: Family Medicine

## 2015-06-18 NOTE — Telephone Encounter (Signed)
Patient requesting refill on HYDROcodone-acetaminophen (NORCO/VICODIN) 5-325 MG per tablet

## 2015-06-18 NOTE — Telephone Encounter (Signed)
Last visit: 03-21-2015 Last refill: 02/23/2015 #30, 0rf No pending appt Please advise

## 2015-06-19 NOTE — Telephone Encounter (Signed)
Refill OK.  Avoid regular use. 

## 2015-06-21 MED ORDER — HYDROCODONE-ACETAMINOPHEN 5-325 MG PO TABS: ORAL_TABLET | ORAL | Status: DC

## 2015-06-21 NOTE — Telephone Encounter (Signed)
Pt is aware that RX is up front to be picked up.

## 2015-06-21 NOTE — Telephone Encounter (Signed)
Rx printed for signature. 

## 2015-07-22 ENCOUNTER — Telehealth: Payer: Self-pay | Admitting: Family Medicine

## 2015-07-22 MED ORDER — ALPRAZOLAM 0.5 MG PO TABS: 0.5000 mg | ORAL_TABLET | Freq: Three times a day (TID) | ORAL | Status: DC | PRN

## 2015-07-22 NOTE — Telephone Encounter (Signed)
Verbally called in for patient.  

## 2015-07-22 NOTE — Telephone Encounter (Signed)
Paul Morrison called saying he needs a refill of Xanax. Please give him a call if needed.  Pt's ph# [phone] Thank you.

## 2015-11-18 ENCOUNTER — Other Ambulatory Visit: Payer: Self-pay | Admitting: Family Medicine

## 2015-12-02 ENCOUNTER — Other Ambulatory Visit: Payer: Self-pay | Admitting: Family Medicine

## 2015-12-06 ENCOUNTER — Other Ambulatory Visit: Payer: Self-pay | Admitting: Family Medicine

## 2015-12-06 NOTE — Telephone Encounter (Signed)
Last OV 03/30/2016 Last refill 07/22/2015 #60, 0rf Please advise

## 2015-12-06 NOTE — Telephone Encounter (Signed)
Refill once.  He takes this infrequently for severe back pain.

## 2015-12-07 MED ORDER — HYDROCODONE-ACETAMINOPHEN 5-325 MG PO TABS: ORAL_TABLET | ORAL | Status: DC

## 2015-12-21 ENCOUNTER — Other Ambulatory Visit: Payer: Self-pay | Admitting: Family Medicine

## 2016-01-20 ENCOUNTER — Other Ambulatory Visit: Payer: Self-pay | Admitting: Family Medicine

## 2016-01-21 ENCOUNTER — Ambulatory Visit (INDEPENDENT_AMBULATORY_CARE_PROVIDER_SITE_OTHER): Payer: Managed Care, Other (non HMO) | Admitting: Family Medicine

## 2016-01-21 ENCOUNTER — Encounter: Payer: Self-pay | Admitting: Family Medicine

## 2016-01-21 VITALS — BP 110/90 | HR 67 | Temp 98.3°F | Ht 75.0 in | Wt 257.0 lb

## 2016-01-21 DIAGNOSIS — R1031 Right lower quadrant pain: Secondary | ICD-10-CM

## 2016-01-21 DIAGNOSIS — R1030 Lower abdominal pain, unspecified: Secondary | ICD-10-CM

## 2016-01-21 DIAGNOSIS — E291 Testicular hypofunction: Secondary | ICD-10-CM | POA: Diagnosis not present

## 2016-01-21 DIAGNOSIS — R7989 Other specified abnormal findings of blood chemistry: Secondary | ICD-10-CM

## 2016-01-21 LAB — CBC WITH DIFFERENTIAL/PLATELET
BASOS PCT: 1 %
Basophils Absolute: 98 cells/uL (ref 0–200)
EOS ABS: 196 {cells}/uL (ref 15–500)
Eosinophils Relative: 2 %
HEMATOCRIT: 50.9 % — AB (ref 38.5–50.0)
HEMOGLOBIN: 18.1 g/dL — AB (ref 13.2–17.1)
LYMPHS ABS: 2646 {cells}/uL (ref 850–3900)
Lymphocytes Relative: 27 %
MCH: 32.4 pg (ref 27.0–33.0)
MCHC: 35.6 g/dL (ref 32.0–36.0)
MCV: 91.2 fL (ref 80.0–100.0)
MONO ABS: 588 {cells}/uL (ref 200–950)
MPV: 10 fL (ref 7.5–12.5)
Monocytes Relative: 6 %
Neutro Abs: 6272 cells/uL (ref 1500–7800)
Neutrophils Relative %: 64 %
Platelets: 200 10*3/uL (ref 140–400)
RBC: 5.58 MIL/uL (ref 4.20–5.80)
RDW: 13.7 % (ref 11.0–15.0)
WBC: 9.8 10*3/uL (ref 3.8–10.8)

## 2016-01-21 NOTE — Progress Notes (Signed)
   Subjective:    Patient ID: Paul Morrison, male    DOB: [birth year], 60 y.o.   MRN: [id number]  HPI  Patient seen for the following issues  2 to three-week history of right groin pain. He describes a burning type quality of pain which is in the right groin without radiation. He has not noted any edema or swelling. No rash. No adenopathy. Remote history nguinal hernia repair back in his early 110s. No pain with ambulation. No lumbar pain. No skin rash. Severity of pain is 3-4 out of 10  Patient has low testosterone. Requesting refills of medication. He is overdue for CBC and PSA.  Symptomatically improved after starting replacement. Exercising regularly.  Past Medical History  Diagnosis Date  . Hyperlipidemia   . Low back pain   . Hypertension    Past Surgical History  Procedure Laterality Date  . Vasectomy    . Hernia repair    . Keratatomy    . Lumbar fusion    . Knee arthroscopy    . Injection knee      both knees    reports that he has quit smoking. He does not have any smokeless tobacco history on file. He reports that he drinks alcohol. He reports that he does not use illicit drugs. family history includes Asthma in his mother; Hyperlipidemia in his mother; Hypertension in his mother. Allergies  Allergen Reactions  . Celecoxib      Review of Systems  Constitutional: Negative for fever and chills.  Gastrointestinal: Negative for nausea, vomiting and abdominal pain.  Genitourinary: Negative for dysuria.  Skin: Negative for rash.  Neurological: Negative for weakness and numbness.  Hematological: Negative for adenopathy.       Objective:   Physical Exam  Constitutional: He appears well-developed and well-nourished. No distress.  Cardiovascular: Normal rate and regular rhythm.   Pulmonary/Chest: Effort normal and breath sounds normal. No respiratory distress. He has no wheezes. He has no rales.  Genitourinary:  Right inguinal region scar from remote  surgery. No hernia. No testicle mass. No scrotal masses. No inguinal tenderness. No adenopathy  Musculoskeletal: He exhibits no edema.  Right hip full range of motion with internal and external rotation. No leg edema. Good distal pulses  Skin: No rash noted.          Assessment & Plan:  #1 right inguinal pain. No evidence for recurrent hernia. Doubt related to hip joint pathology. He has no pain whatsoever with ambulation. Question related to increased sensitivity of scar tissue from prior surgery. Location of burning is near site of previous inguinal hernia repair. Observe for now.  #2  low testosterone.  Refills of Axiron were given. Check CBC, PSA, total testosterone level.  Eulas Post MD [facility] at [facility]

## 2016-01-21 NOTE — Progress Notes (Signed)
Pre visit review using our clinic review tool, if applicable. No additional management support is needed unless otherwise documented below in the visit note. 

## 2016-01-22 LAB — PSA: PSA: 1.25 ng/mL (ref <=4.00)

## 2016-01-22 LAB — TESTOSTERONE: TESTOSTERONE: 963 ng/dL — AB (ref 250–827)

## 2016-01-23 ENCOUNTER — Encounter: Payer: Self-pay | Admitting: Family Medicine

## 2016-03-07 ENCOUNTER — Telehealth: Payer: Self-pay

## 2016-03-07 NOTE — Telephone Encounter (Signed)
Prior Authorization form received for AXIRON 30 MG/ACT SOLN. Form stated REJECTED. This nurse logged onto portal and form had never been sent to plan. Form completed and sent 03/07/16. Awaiting approval/denial.

## 2016-03-10 ENCOUNTER — Telehealth: Payer: Self-pay

## 2016-03-10 NOTE — Telephone Encounter (Signed)
Prior Authorization denial received for AXIRON 30 MG/ACT SOLN  The information did not show that the following requirements were met: You are unable to take the formulary alternatives, There are no formulary alternatives, or the formulary alternatives are not recommended for you, or you require a specific dosage form as listed.

## 2016-03-12 NOTE — Telephone Encounter (Signed)
Does his insurance cover any form of testosterone?

## 2016-03-12 NOTE — Telephone Encounter (Signed)
Does his insurance cover ANY form of testosterone?

## 2016-03-13 ENCOUNTER — Telehealth: Payer: Self-pay

## 2016-03-13 NOTE — Telephone Encounter (Signed)
Spoke with patient who states in the past he has used Androgel and a testosterone 30 mg/act soln. Neither of these two were very effective in getting his levels where they need to be. I did encourage him to call his insurance company and talk to them as they have been covering  the [place]. He said if they don't cover it the cost is $500. He is also asking that he be given a prescription for the generic Bobetta Lime to save some money also.

## 2016-03-13 NOTE — Telephone Encounter (Signed)
We could try Sildenafil 20 mg take 2 to 5 tablets one hour prior to sexual activity #50 with 3 refills. Take Viagra off list.

## 2016-03-13 NOTE — Telephone Encounter (Signed)
Pt has tried and failed androgel and injection testosterone and please do an appeal for possible approver per pt. York Cerise please call pt at [phone]

## 2016-03-14 ENCOUNTER — Encounter: Payer: Self-pay | Admitting: Family Medicine

## 2016-03-20 ENCOUNTER — Other Ambulatory Visit: Payer: Self-pay | Admitting: Family Medicine

## 2016-03-20 NOTE — Telephone Encounter (Signed)
error 

## 2016-03-21 ENCOUNTER — Other Ambulatory Visit: Payer: Self-pay

## 2016-03-21 MED ORDER — TESTOSTERONE 30 MG/ACT TD SOLN: TRANSDERMAL | 1 refills | Status: DC

## 2016-03-22 NOTE — Telephone Encounter (Signed)
Rx refill sent to pharmacy. 

## 2016-03-22 NOTE — Telephone Encounter (Signed)
Last OV was 01/21/2016 Last refill on Hydrocodone was 12/07/2015 #30 Please advise  I have also forwarded a message to [facility] about starting a PA on axiron and resubmitted a script.

## 2016-03-23 ENCOUNTER — Telehealth: Payer: Self-pay

## 2016-03-23 MED ORDER — HYDROCODONE-ACETAMINOPHEN 5-325 MG PO TABS: ORAL_TABLET | ORAL | 0 refills | Status: DC

## 2016-03-23 NOTE — Telephone Encounter (Signed)
We have resubmitted the PA to include patient's attempts of other medications that have failed. We are pending their fax; resubmission occurred this morning.

## 2016-03-23 NOTE — Telephone Encounter (Signed)
Pt would like a refill on Hydrocodone.  Last refill 12/07/2015 #30 Pending appt on 03/29/16

## 2016-03-23 NOTE — Telephone Encounter (Signed)
Pt is aware that RX has been printed and he will pick up Wednesday at his appt.

## 2016-03-23 NOTE — Telephone Encounter (Signed)
Refill OK

## 2016-03-23 NOTE — Telephone Encounter (Signed)
Printed for signature

## 2016-03-23 NOTE — Telephone Encounter (Signed)
Spoke with pt, he has received medication at zero cost.

## 2016-03-24 ENCOUNTER — Telehealth: Payer: Self-pay | Admitting: *Deleted

## 2016-03-24 NOTE — Telephone Encounter (Signed)
Spoke to pt, told him I was just calling to clarify that he was able to pickup Axiron Rx. Pt said he picked it up at the pharmacy. Told pt okay, have a good weekend. Pt verbalized understanding.

## 2016-03-29 ENCOUNTER — Ambulatory Visit (INDEPENDENT_AMBULATORY_CARE_PROVIDER_SITE_OTHER): Payer: Managed Care, Other (non HMO) | Admitting: Family Medicine

## 2016-03-29 ENCOUNTER — Encounter: Payer: Self-pay | Admitting: Family Medicine

## 2016-03-29 VITALS — BP 130/88 | HR 71 | Temp 98.4°F | Ht 75.0 in | Wt 253.2 lb

## 2016-03-29 DIAGNOSIS — D751 Secondary polycythemia: Secondary | ICD-10-CM

## 2016-03-29 DIAGNOSIS — E291 Testicular hypofunction: Secondary | ICD-10-CM

## 2016-03-29 DIAGNOSIS — I10 Essential (primary) hypertension: Secondary | ICD-10-CM | POA: Diagnosis not present

## 2016-03-29 DIAGNOSIS — E785 Hyperlipidemia, unspecified: Secondary | ICD-10-CM

## 2016-03-29 DIAGNOSIS — Z23 Encounter for immunization: Secondary | ICD-10-CM | POA: Diagnosis not present

## 2016-03-29 DIAGNOSIS — N529 Male erectile dysfunction, unspecified: Secondary | ICD-10-CM

## 2016-03-29 MED ORDER — SILDENAFIL CITRATE 20 MG PO TABS: ORAL_TABLET | ORAL | 6 refills | Status: DC

## 2016-03-29 MED ORDER — ALPRAZOLAM 0.5 MG PO TABS: 0.5000 mg | ORAL_TABLET | Freq: Three times a day (TID) | ORAL | 0 refills | Status: DC | PRN

## 2016-03-29 NOTE — Progress Notes (Signed)
Pre visit review using our clinic review tool, if applicable. No additional management support is needed unless otherwise documented below in the visit note. 

## 2016-03-29 NOTE — Progress Notes (Signed)
Subjective:     Patient ID: Paul Morrison, male   DOB: June 27, 1956, 60 y.o.   MRN: [id number]  HPI Patient has multiple chronic problems including history of hypertension, hypogonadism, erectile dysfunction, hyperlipidemia. Here today to discuss several issues as follows  History of erectile dysfunction. He has used Viagra in the past. Requesting prescription for generic because of cost issues. This is generally worked well for him in the past  History of low testosterone. He is symptomatically improved on replacement with recent level over 900. However, he has developed some polycythemia with hematocrit around 51. Nonsmoker.  Right posterior knee pain. About 2 weeks scaly slipped on the floor in a motel. He has vague pain posterior aspect of the knee. No real instability or obvious weakness. No swelling. No bruising. He's had prior knee surgery with arthroscopic surgery for meniscus tear. Pain is slightly improved since the injury  Upcoming travel. He has history of intermittent anxiety and what sounds almost like acute panic symptoms at times. Requesting refill Xanax for his travel. He uses very infrequently.  Hyperlipidemia and he is overdue for labs as far as lipids. He takes Lipitor regularly  Past Medical History:  Diagnosis Date  . Hyperlipidemia   . Hypertension   . Low back pain    Past Surgical History:  Procedure Laterality Date  . HERNIA REPAIR    . INJECTION KNEE     both knees  . keratatomy    . knee arthroscopy    . LUMBAR FUSION    . VASECTOMY      reports that he has quit smoking. He does not have any smokeless tobacco history on file. He reports that he drinks alcohol. He reports that he does not use drugs. family history includes Asthma in his mother; Hyperlipidemia in his mother; Hypertension in his mother. Allergies  Allergen Reactions  . Celecoxib      Review of Systems  Constitutional: Negative for fatigue.  Eyes: Negative for visual disturbance.   Respiratory: Negative for cough, chest tightness and shortness of breath.   Cardiovascular: Negative for chest pain, palpitations and leg swelling.  Neurological: Negative for dizziness, syncope, weakness, light-headedness and headaches.  Psychiatric/Behavioral: Negative for dysphoric mood.       Objective:   Physical Exam  Constitutional: He is oriented to person, place, and time. He appears well-developed and well-nourished. No distress.  Neck: Neck supple. No thyromegaly present.  Cardiovascular: Normal rate and regular rhythm.   Pulmonary/Chest: Effort normal and breath sounds normal. No respiratory distress. He has no wheezes. He has no rales.  Musculoskeletal: He exhibits no edema.  Right knee full range of motion. No localized tenderness. Ligament testing is stable. He has some mild prominence and the popliteal space but this is somewhat bilateral and symmetric. May have small Baker cyst  Neurological: He is alert and oriented to person, place, and time. No cranial nerve deficit.  Psychiatric: He has a normal mood and affect. His behavior is normal.       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

## 2016-03-30 DIAGNOSIS — Z23 Encounter for immunization: Secondary | ICD-10-CM

## 2016-05-24 ENCOUNTER — Other Ambulatory Visit: Payer: Self-pay

## 2016-05-24 ENCOUNTER — Telehealth: Payer: Self-pay | Admitting: Family Medicine

## 2016-05-24 DIAGNOSIS — E785 Hyperlipidemia, unspecified: Secondary | ICD-10-CM

## 2016-05-24 DIAGNOSIS — Z1159 Encounter for screening for other viral diseases: Secondary | ICD-10-CM

## 2016-05-24 DIAGNOSIS — D751 Secondary polycythemia: Secondary | ICD-10-CM

## 2016-05-24 NOTE — Telephone Encounter (Signed)
Orders entered

## 2016-05-24 NOTE — Telephone Encounter (Signed)
Pt wants to go to [facility] for his labs tomorrow.  Order is in the system, but not for the [place] location. Also, pt would like to add the Hep C screening to those labs. Is that OK?

## 2016-05-25 ENCOUNTER — Other Ambulatory Visit (INDEPENDENT_AMBULATORY_CARE_PROVIDER_SITE_OTHER): Payer: Self-pay

## 2016-05-25 DIAGNOSIS — Z1159 Encounter for screening for other viral diseases: Secondary | ICD-10-CM

## 2016-05-25 DIAGNOSIS — D751 Secondary polycythemia: Secondary | ICD-10-CM

## 2016-05-25 DIAGNOSIS — E785 Hyperlipidemia, unspecified: Secondary | ICD-10-CM

## 2016-05-25 LAB — CBC
HEMATOCRIT: 52.3 % — AB (ref 39.0–52.0)
Hemoglobin: 17.9 g/dL — ABNORMAL HIGH (ref 13.0–17.0)
MCHC: 34.2 g/dL (ref 30.0–36.0)
MCV: 92.5 fl (ref 78.0–100.0)
Platelets: 224 10*3/uL (ref 150.0–400.0)
RBC: 5.65 Mil/uL (ref 4.22–5.81)
RDW: 13.6 % (ref 11.5–15.5)
WBC: 7.5 10*3/uL (ref 4.0–10.5)

## 2016-05-25 LAB — LIPID PANEL
CHOL/HDL RATIO: 4
CHOLESTEROL: 208 mg/dL — AB (ref 0–200)
HDL: 46.7 mg/dL (ref >39.00)
LDL CALC: 136 mg/dL — AB (ref 0–99)
NonHDL: 160.83
TRIGLYCERIDES: 122 mg/dL (ref 0.0–149.0)
VLDL: 24.4 mg/dL (ref 0.0–40.0)

## 2016-05-26 LAB — HEPATITIS C ANTIBODY: HCV Ab: NEGATIVE

## 2016-05-29 ENCOUNTER — Telehealth: Payer: Self-pay

## 2016-05-29 NOTE — Telephone Encounter (Signed)
Form sent by insurance faxed back with MD signature and lab value attached. Awaiting approval or denial.

## 2016-05-30 ENCOUNTER — Other Ambulatory Visit: Payer: Self-pay | Admitting: Family Medicine

## 2016-05-30 DIAGNOSIS — I10 Essential (primary) hypertension: Secondary | ICD-10-CM

## 2016-06-01 NOTE — Telephone Encounter (Signed)
PA denied because we have to submit proof of patient's testosterone level and the last one drawn in his chart was higher than normal.

## 2016-06-02 ENCOUNTER — Encounter: Payer: Self-pay | Admitting: Family Medicine

## 2016-06-02 NOTE — Telephone Encounter (Signed)
Confirm that he is using 2 sprays per day of Axiron.  If so, have him reduce to ONE spray to skin per day and repeat total testosterone level in one month.    I have never heard of PA denial based on current level vs pretreatment level.

## 2016-06-05 NOTE — Telephone Encounter (Signed)
Message sent through Mychart

## 2016-08-22 ENCOUNTER — Encounter: Payer: Self-pay | Admitting: Family Medicine

## 2016-08-30 ENCOUNTER — Ambulatory Visit (INDEPENDENT_AMBULATORY_CARE_PROVIDER_SITE_OTHER): Payer: BLUE CROSS/BLUE SHIELD | Admitting: Family Medicine

## 2016-08-30 ENCOUNTER — Encounter: Payer: Self-pay | Admitting: Family Medicine

## 2016-08-30 VITALS — BP 140/90 | HR 90 | Temp 98.4°F | Ht 75.0 in | Wt 254.0 lb

## 2016-08-30 DIAGNOSIS — Z01818 Encounter for other preprocedural examination: Secondary | ICD-10-CM

## 2016-08-30 DIAGNOSIS — I10 Essential (primary) hypertension: Secondary | ICD-10-CM | POA: Diagnosis not present

## 2016-08-30 DIAGNOSIS — D751 Secondary polycythemia: Secondary | ICD-10-CM | POA: Diagnosis not present

## 2016-08-30 DIAGNOSIS — E291 Testicular hypofunction: Secondary | ICD-10-CM | POA: Diagnosis not present

## 2016-08-30 MED ORDER — HYDROCODONE-ACETAMINOPHEN 5-325 MG PO TABS: ORAL_TABLET | ORAL | 0 refills | Status: DC

## 2016-08-30 MED ORDER — ALPRAZOLAM 0.5 MG PO TABS: 0.5000 mg | ORAL_TABLET | Freq: Three times a day (TID) | ORAL | 0 refills | Status: DC | PRN

## 2016-08-30 NOTE — Progress Notes (Signed)
Subjective:     Patient ID: Paul Morrison, male   DOB: 08-29-1955, 61 y.o.   MRN: [id number]  HPI Patient seen for preoperative clearance for planned right total knee replacement. No cardiac history. No chronic pulmonary problems. Nonsmoker. He's had previous knee surgery in the past. He states his father had factor V Leiden deficiency. Patient has never had any history of DVT or pulmonary embolus.  He's had previous EKGs but no cardiac stress testing. He exercises several times for week. Yesterday did one and one half hours on elliptical fairly vigorously without difficulty. Frequently cycles 30-50 miles without difficulty. No exertional dyspnea. No exertional chest pain.  History of hypogonadism. His hematocrit was climbing up to around 51 with elevated testosterone back in November and he discontinued testosterone together. He has not noted any excessive fatigue since doing so. He's tried to scale back carbohydrate intake and is losing some weight due to his efforts.  He has hypertension which has been fairly well controlled by losartan HCTZ. Nonsmoker. Hyperlipidemia on Lipitor  Past Medical History:  Diagnosis Date  . Hyperlipidemia   . Hypertension   . Low back pain    Past Surgical History:  Procedure Laterality Date  . HERNIA REPAIR    . INJECTION KNEE     both knees  . keratatomy    . knee arthroscopy    . LUMBAR FUSION    . VASECTOMY      reports that he has quit smoking. He has never used smokeless tobacco. He reports that he drinks alcohol. He reports that he does not use drugs. family history includes Asthma in his mother; Hyperlipidemia in his mother; Hypertension in his mother. Allergies  Allergen Reactions  . Celecoxib      Review of Systems  Constitutional: Negative for fatigue and unexpected weight change.  Eyes: Negative for visual disturbance.  Respiratory: Negative for cough, chest tightness and shortness of breath.   Cardiovascular: Negative for chest  pain, palpitations and leg swelling.  Gastrointestinal: Negative for abdominal pain.  Endocrine: Negative for polydipsia and polyuria.  Neurological: Negative for dizziness, syncope, weakness, light-headedness and headaches.       Objective:   Physical Exam  Constitutional: He is oriented to person, place, and time. He appears well-developed and well-nourished.  HENT:  Right Ear: External ear normal.  Left Ear: External ear normal.  Mouth/Throat: Oropharynx is clear and moist.  Eyes: Pupils are equal, round, and reactive to light.  Neck: Neck supple. No thyromegaly present.  Cardiovascular: Normal rate and regular rhythm.   Pulmonary/Chest: Effort normal and breath sounds normal. No respiratory distress. He has no wheezes. He has no rales.  Musculoskeletal: He exhibits no edema.  Neurological: He is alert and oriented to person, place, and time.       Assessment:     #1 preoperative clearance. Patient has excellent exercise tolerance as above. Exercises several hours per week with no recent concerning symptoms. EKG today shows sinus rhythm with no acute changes.  #2 hypertension which has been fairly well controlled in the past  #3 history of hypogonadism. He was developing some secondary polycythemia and testosterone and is now discontinued  #4 positive family history of factor V Leiden. Patient has never had issue with DVT or pulmonary embolus    Plan:     -No medical or cardiac contraindications for surgery at this time and forms completed -We discussed pros and cons of coagulopathy testing but since he has not had prior clinical  issues with DVT or pulmonary embolus would not be indicated at this time. -Usual perioperative preventative measures to reduce risk of DVT and also with things like prolonged travel -Continue weight loss efforts -Follow-up CBC later this year to see if his polycythemia is resolving off testosterone  Eulas Post MD [facility] at  [facility]

## 2016-08-30 NOTE — Progress Notes (Signed)
Pre visit review using our clinic review tool, if applicable. No additional management support is needed unless otherwise documented below in the visit note. 

## 2016-09-06 ENCOUNTER — Ambulatory Visit: Payer: Self-pay | Admitting: Orthopedic Surgery

## 2016-09-21 ENCOUNTER — Encounter (HOSPITAL_COMMUNITY): Payer: Self-pay | Admitting: *Deleted

## 2016-09-27 NOTE — H&P (Addendum)
TOTAL KNEE ADMISSION H&P  Patient is being admitted for right total knee arthroplasty.  Subjective:  Chief Complaint:right knee pain.  HPI: Paul Morrison, 61 y.o. male, has a history of pain and functional disability in the right knee due to arthritis and has failed non-surgical conservative treatments for greater than 12 weeks to includecorticosteriod injections, viscosupplementation injections, supervised PT with diminished ADL's post treatment, weight reduction as appropriate and activity modification.  Onset of symptoms was gradual, starting 3 years ago with gradually worsening course since that time. The patient noted prior procedures on the knee to include  arthroscopy on the right knee(s).  Patient currently rates pain in the right knee(s) at 6 out of 10 with activity. Patient has pain that interferes with activities of daily living, pain with passive range of motion and joint swelling.  Patient has evidence of subchondral sclerosis, periarticular osteophytes and joint space narrowing by imaging studies. There is no active infection. Previous surgery canceled and rescheduled to this time.  Patient Active Problem List   Diagnosis Date Noted  . 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 Erectile dysfunction 09/01/2013  . DYSMETABOLIC SYNDROME X [id number]  . 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 ACUTE MAXILLARY SINUSITIS 06/23/2009  . RHINITIS 03/25/2009  . FOOT PAIN 01/25/2009  . Essential hypertension 03/21/2007  . LOW BACK PAIN 03/21/2007  . Hyperlipidemia 03/05/2007   Past Medical History:  Diagnosis Date  . Hyperlipidemia   . Hypertension   . Low back pain     Past Surgical History:  Procedure Laterality Date  . HERNIA REPAIR    . INJECTION KNEE     both knees  . keratatomy    . knee arthroscopy    . LUMBAR FUSION    . VASECTOMY      No prescriptions prior to admission.   Allergies  Allergen Reactions  . Celecoxib     Social  History  Substance Use Topics  . Smoking status: Former Research scientist (life sciences)  . Smokeless tobacco: Never Used  . Alcohol use Yes    Family History  Problem Relation Age of Onset  . Hyperlipidemia Mother   . Hypertension Mother   . Asthma Mother   . Coronary artery disease    . Aneurysm       Review of Systems  Constitutional: Negative.   HENT: Negative.   Eyes: Negative.   Respiratory: Negative.   Cardiovascular: Negative.   Gastrointestinal: Negative.   Genitourinary: Negative.   Musculoskeletal: Positive for joint pain.  Skin: Negative.   Neurological: Negative.   Psychiatric/Behavioral: Negative.     Objective:  Physical Exam  Constitutional: He is oriented to person, place, and time. He appears well-developed.  HENT:  Head: Normocephalic.  Eyes: Pupils are equal, round, and reactive to light.  Neck: Normal range of motion.  Cardiovascular: Intact distal pulses.   Respiratory: Effort normal.  GI: Soft.  Genitourinary:  Genitourinary Comments: Deferred  Musculoskeletal:  Right knee pain. RLE grossly n/v intact.  Neurological: He is alert and oriented to person, place, and time.  Skin: Skin is warm and dry.  Psychiatric: His behavior is normal.    Vital signs in last 24 hours: BP: ()/()  Arterial Line BP: ()/()   Labs:   Estimated body mass index is 31.75 kg/m as calculated from the following:   Height as of 08/30/16: 6\' 3"  (1.905 m).   Weight as of 08/30/16: 115.2 kg (254 lb).   Imaging Review Plain radiographs demonstrate  moderate degenerative joint disease of the right knee(s). The overall alignment ismild varus. The bone quality appears to be good for age and reported activity level.  Assessment/Plan:  End stage arthritis, right knee   The patient history, physical examination, clinical judgment of the provider and imaging studies are consistent with end stage degenerative joint disease of the right knee(s) and total knee arthroplasty is deemed medically  necessary. The treatment options including medical management, injection therapy arthroscopy and arthroplasty were discussed at length. The risks and benefits of total knee arthroplasty were presented and reviewed. The risks due to aseptic loosening, infection, stiffness, patella tracking problems, thromboembolic complications and other imponderables were discussed. The patient acknowledged the explanation, agreed to proceed with the plan and consent was signed. Patient is being admitted for inpatient treatment for surgery, pain control, PT, OT, prophylactic antibiotics, VTE prophylaxis, progressive ambulation and ADL's and discharge planning. The patient is planning to be discharged home with home health services.  Will use IV tranexamic acid. Contraindications and adverse affects of Tranexamic acid discussed in detail. Patient denies any of these at this time and understands the risks and benefits.

## 2016-10-02 ENCOUNTER — Ambulatory Visit: Payer: Self-pay | Admitting: Orthopedic Surgery

## 2016-10-05 ENCOUNTER — Inpatient Hospital Stay (HOSPITAL_COMMUNITY): Admission: RE | Admit: 2016-10-05 | Payer: Self-pay | Source: Ambulatory Visit

## 2016-11-10 ENCOUNTER — Other Ambulatory Visit: Payer: Self-pay | Admitting: Specialist

## 2016-11-16 ENCOUNTER — Encounter (HOSPITAL_COMMUNITY)
Admission: RE | Admit: 2016-11-16 | Discharge: 2016-11-16 | Disposition: A | Discharge status: Home / Self Care | Payer: BLUE CROSS/BLUE SHIELD | Source: Ambulatory Visit | Attending: Specialist | Admitting: Specialist

## 2016-11-16 ENCOUNTER — Encounter (HOSPITAL_COMMUNITY): Payer: Self-pay

## 2016-11-16 DIAGNOSIS — Z01812 Encounter for preprocedural laboratory examination: Secondary | ICD-10-CM | POA: Insufficient documentation

## 2016-11-16 DIAGNOSIS — M1711 Unilateral primary osteoarthritis, right knee: Secondary | ICD-10-CM | POA: Diagnosis not present

## 2016-11-16 LAB — URINALYSIS, ROUTINE W REFLEX MICROSCOPIC
Bacteria, UA: NONE SEEN
Bilirubin Urine: NEGATIVE
GLUCOSE, UA: NEGATIVE mg/dL
Hgb urine dipstick: NEGATIVE
Ketones, ur: NEGATIVE mg/dL
Leukocytes, UA: NEGATIVE
NITRITE: NEGATIVE
PH: 6 (ref 5.0–8.0)
Protein, ur: 100 mg/dL — AB
SPECIFIC GRAVITY, URINE: 1.013 (ref 1.005–1.030)
Squamous Epithelial / LPF: NONE SEEN

## 2016-11-16 LAB — PROTIME-INR
INR: 0.95
PROTHROMBIN TIME: 12.7 s (ref 11.4–15.2)

## 2016-11-16 LAB — BASIC METABOLIC PANEL
Anion gap: 8 (ref 5–15)
BUN: 13 mg/dL (ref 6–20)
CO2: 27 mmol/L (ref 22–32)
Calcium: 9.4 mg/dL (ref 8.9–10.3)
Chloride: 103 mmol/L (ref 101–111)
Creatinine, Ser: 1.07 mg/dL (ref 0.61–1.24)
GFR calc Af Amer: >60 mL/min (ref >60)
Glucose, Bld: 123 mg/dL — ABNORMAL HIGH (ref 65–99)
POTASSIUM: 4.5 mmol/L (ref 3.5–5.1)
Sodium: 138 mmol/L (ref 135–145)

## 2016-11-16 LAB — APTT: APTT: 28 s (ref 24–36)

## 2016-11-16 LAB — CBC
HCT: 48.8 % (ref 39.0–52.0)
HEMOGLOBIN: 17.1 g/dL — AB (ref 13.0–17.0)
MCH: 31.8 pg (ref 26.0–34.0)
MCHC: 35 g/dL (ref 30.0–36.0)
MCV: 90.7 fL (ref 78.0–100.0)
PLATELETS: 164 10*3/uL (ref 150–400)
RBC: 5.38 MIL/uL (ref 4.22–5.81)
RDW: 12.5 % (ref 11.5–15.5)
WBC: 6.2 10*3/uL (ref 4.0–10.5)

## 2016-11-16 LAB — SURGICAL PCR SCREEN
MRSA, PCR: NEGATIVE
Staphylococcus aureus: NEGATIVE

## 2016-11-16 NOTE — Progress Notes (Signed)
08-30-16 (EPIC) EKG and on chart 08-30-16 (EPIC) Preop clearance Dr. Elease Hashimoto and on chart

## 2016-11-16 NOTE — Patient Instructions (Signed)
Paul Morrison  11/16/2016   Your procedure is scheduled on: 11/24/2016    Report to [facility] Main  Entrance and take [facility] elevators to 3rd floor to [place] at   1000am.        Call this number if you have problems the morning of surgery [phone]    Remember: ONLY 1 PERSON MAY GO WITH YOU TO SHORT STAY TO GET  READY MORNING OF YOUR SURGERY.  Do not eat food or drink liquids :After Midnight.     Take these medicines the morning of surgery with A SIP OF WATER: Xanax if needed                                 You may not have any metal on your body including hair pins and              piercings  Do not wear jewelry,  lotions, powders or perfumes, deodorant                   Men may shave face and neck.   Do not bring valuables to the hospital. [place].  Contacts, dentures or bridgework may not be worn into surgery.  Leave suitcase in the car. After surgery it may be brought to your room.       Special Instructions: N/A              Please read over the following fact sheets you were given: _____________________________________________________________________             [facility] - Preparing for Surgery Before surgery, you can play an important role.  Because skin is not sterile, your skin needs to be as free of germs as possible.  You can reduce the number of germs on your skin by washing with CHG (chlorahexidine gluconate) soap before surgery.  CHG is an antiseptic cleaner which kills germs and bonds with the skin to continue killing germs even after washing. Please DO NOT use if you have an allergy to CHG or antibacterial soaps.  If your skin becomes reddened/irritated stop using the CHG and inform your nurse when you arrive at Short Stay. Do not shave (including legs and underarms) for at least 48 hours prior to the first CHG shower.  You may shave your face/neck. Please follow  these instructions carefully:  1.  Shower with CHG Soap the night before surgery and the  morning of Surgery.  2.  If you choose to wash your hair, wash your hair first as usual with your  normal  shampoo.  3.  After you shampoo, rinse your hair and body thoroughly to remove the  shampoo.                           4.  Use CHG as you would any other liquid soap.  You can apply chg directly  to the skin and wash                       Gently with a scrungie or clean washcloth.  5.  Apply the CHG Soap to your body ONLY FROM  THE NECK DOWN.   Do not use on face/ open                           Wound or open sores. Avoid contact with eyes, ears mouth and genitals (private parts).                       Wash face,  Genitals (private parts) with your normal soap.             6.  Wash thoroughly, paying special attention to the area where your surgery  will be performed.  7.  Thoroughly rinse your body with warm water from the neck down.  8.  DO NOT shower/wash with your normal soap after using and rinsing off  the CHG Soap.                9.  Pat yourself dry with a clean towel.            10.  Wear clean pajamas.            11.  Place clean sheets on your bed the night of your first shower and do not  sleep with pets. Day of Surgery : Do not apply any lotions/deodorants the morning of surgery.  Please wear clean clothes to the hospital/surgery center.  FAILURE TO FOLLOW THESE INSTRUCTIONS MAY RESULT IN THE CANCELLATION OF YOUR SURGERY PATIENT SIGNATURE_________________________________  NURSE SIGNATURE__________________________________  ________________________________________________________________________  WHAT IS A BLOOD TRANSFUSION? Blood Transfusion Information  A transfusion is the replacement of blood or some of its parts. Blood is made up of multiple cells which provide different functions.  Red blood cells carry oxygen and are used for blood loss replacement.  White blood cells fight  against infection.  Platelets control bleeding.  Plasma helps clot blood.  Other blood products are available for specialized needs, such as hemophilia or other clotting disorders. BEFORE THE TRANSFUSION  Who gives blood for transfusions?   Healthy volunteers who are fully evaluated to make sure their blood is safe. This is blood bank blood. Transfusion therapy is the safest it has ever been in the practice of medicine. Before blood is taken from a donor, a complete history is taken to make sure that person has no history of diseases nor engages in risky social behavior (examples are intravenous drug use or sexual activity with multiple partners). The donor's travel history is screened to minimize risk of transmitting infections, such as malaria. The donated blood is tested for signs of infectious diseases, such as HIV and hepatitis. The blood is then tested to be sure it is compatible with you in order to minimize the chance of a transfusion reaction. If you or a relative donates blood, this is often done in anticipation of surgery and is not appropriate for emergency situations. It takes many days to process the donated blood. RISKS AND COMPLICATIONS Although transfusion therapy is very safe and saves many lives, the main dangers of transfusion include:   Getting an infectious disease.  Developing a transfusion reaction. This is an allergic reaction to something in the blood you were given. Every precaution is taken to prevent this. The decision to have a blood transfusion has been considered carefully by your caregiver before blood is given. Blood is not given unless the benefits outweigh the risks. AFTER THE TRANSFUSION  Right after receiving a blood transfusion, you will usually feel much better and  more energetic. This is especially true if your red blood cells have gotten low (anemic). The transfusion raises the level of the red blood cells which carry oxygen, and this usually causes an  energy increase.  The nurse administering the transfusion will monitor you carefully for complications. HOME CARE INSTRUCTIONS  No special instructions are needed after a transfusion. You may find your energy is better. Speak with your caregiver about any limitations on activity for underlying diseases you may have. SEEK MEDICAL CARE IF:   Your condition is not improving after your transfusion.  You develop redness or irritation at the intravenous (IV) site. SEEK IMMEDIATE MEDICAL CARE IF:  Any of the following symptoms occur over the next 12 hours:  Shaking chills.  You have a temperature by mouth above 102 F (38.9 C), not controlled by medicine.  Chest, back, or muscle pain.  People around you feel you are not acting correctly or are confused.  Shortness of breath or difficulty breathing.  Dizziness and fainting.  You get a rash or develop hives.  You have a decrease in urine output.  Your urine turns a dark color or changes to pink, red, or brown. Any of the following symptoms occur over the next 10 days:  You have a temperature by mouth above 102 F (38.9 C), not controlled by medicine.  Shortness of breath.  Weakness after normal activity.  The white part of the eye turns yellow (jaundice).  You have a decrease in the amount of urine or are urinating less often.  Your urine turns a dark color or changes to pink, red, or brown. Document Released: 07/14/2000 Document Revised: 10/09/2011 Document Reviewed: 03/02/2008 ExitCare Patient Information 2014 [place].  _______________________________________________________________________  Incentive Spirometer  An incentive spirometer is a tool that can help keep your lungs clear and active. This tool measures how well you are filling your lungs with each breath. Taking long deep breaths may help reverse or decrease the chance of developing breathing (pulmonary) problems (especially infection) following:  A  long period of time when you are unable to move or be active. BEFORE THE PROCEDURE   If the spirometer includes an indicator to show your best effort, your nurse or respiratory therapist will set it to a desired goal.  If possible, sit up straight or lean slightly forward. Try not to slouch.  Hold the incentive spirometer in an upright position. INSTRUCTIONS FOR USE  1. Sit on the edge of your bed if possible, or sit up as far as you can in bed or on a chair. 2. Hold the incentive spirometer in an upright position. 3. Breathe out normally. 4. Place the mouthpiece in your mouth and seal your lips tightly around it. 5. Breathe in slowly and as deeply as possible, raising the piston or the ball toward the top of the column. 6. Hold your breath for 3-5 seconds or for as long as possible. Allow the piston or ball to fall to the bottom of the column. 7. Remove the mouthpiece from your mouth and breathe out normally. 8. Rest for a few seconds and repeat Steps 1 through 7 at least 10 times every 1-2 hours when you are awake. Take your time and take a few normal breaths between deep breaths. 9. The spirometer may include an indicator to show your best effort. Use the indicator as a goal to work toward during each repetition. 10. After each set of 10 deep breaths, practice coughing to be sure your lungs  are clear. If you have an incision (the cut made at the time of surgery), support your incision when coughing by placing a pillow or rolled up towels firmly against it. Once you are able to get out of bed, walk around indoors and cough well. You may stop using the incentive spirometer when instructed by your caregiver.  RISKS AND COMPLICATIONS  Take your time so you do not get dizzy or light-headed.  If you are in pain, you may need to take or ask for pain medication before doing incentive spirometry. It is harder to take a deep breath if you are having pain. AFTER USE  Rest and breathe slowly and  easily.  It can be helpful to keep track of a log of your progress. Your caregiver can provide you with a simple table to help with this. If you are using the spirometer at home, follow these instructions: [place] IF:   You are having difficultly using the spirometer.  You have trouble using the spirometer as often as instructed.  Your pain medication is not giving enough relief while using the spirometer.  You develop fever of 100.5 F (38.1 C) or higher. SEEK IMMEDIATE MEDICAL CARE IF:   You cough up bloody sputum that had not been present before.  You develop fever of 102 F (38.9 C) or greater.  You develop worsening pain at or near the incision site. MAKE SURE YOU:   Understand these instructions.  Will watch your condition.  Will get help right away if you are not doing well or get worse. Document Released: 11/27/2006 Document Revised: 10/09/2011 Document Reviewed: 01/28/2007 [facility] Patient Information 2014 [place], [place].   ________________________________________________________________________

## 2016-11-17 LAB — ABO/RH: ABO/RH(D): AB POS

## 2016-11-24 ENCOUNTER — Encounter (HOSPITAL_COMMUNITY): Payer: Self-pay | Admitting: Anesthesiology

## 2016-11-24 ENCOUNTER — Encounter (HOSPITAL_COMMUNITY)
Admission: RE | Disposition: A | Discharge status: Home Health | Payer: Self-pay | Source: Ambulatory Visit | Attending: Specialist

## 2016-11-24 ENCOUNTER — Inpatient Hospital Stay (HOSPITAL_COMMUNITY)
Admission: RE | Admit: 2016-11-24 | Discharge: 2016-11-26 | DRG: 470 | Disposition: A | Discharge status: Home Health | Payer: BLUE CROSS/BLUE SHIELD | Source: Ambulatory Visit | Attending: Specialist | Admitting: Specialist

## 2016-11-24 ENCOUNTER — Inpatient Hospital Stay (HOSPITAL_COMMUNITY): Payer: BLUE CROSS/BLUE SHIELD | Admitting: Anesthesiology

## 2016-11-24 DIAGNOSIS — Z87891 Personal history of nicotine dependence: Secondary | ICD-10-CM | POA: Diagnosis not present

## 2016-11-24 DIAGNOSIS — Z981 Arthrodesis status: Secondary | ICD-10-CM | POA: Diagnosis not present

## 2016-11-24 DIAGNOSIS — E785 Hyperlipidemia, unspecified: Secondary | ICD-10-CM | POA: Diagnosis present

## 2016-11-24 DIAGNOSIS — Z888 Allergy status to other drugs, medicaments and biological substances status: Secondary | ICD-10-CM

## 2016-11-24 DIAGNOSIS — Z825 Family history of asthma and other chronic lower respiratory diseases: Secondary | ICD-10-CM

## 2016-11-24 DIAGNOSIS — I10 Essential (primary) hypertension: Secondary | ICD-10-CM | POA: Diagnosis present

## 2016-11-24 DIAGNOSIS — M1711 Unilateral primary osteoarthritis, right knee: Secondary | ICD-10-CM | POA: Diagnosis present

## 2016-11-24 DIAGNOSIS — Z96659 Presence of unspecified artificial knee joint: Secondary | ICD-10-CM

## 2016-11-24 DIAGNOSIS — H900 Conductive hearing loss, bilateral: Secondary | ICD-10-CM | POA: Diagnosis present

## 2016-11-24 HISTORY — PX: TOTAL KNEE ARTHROPLASTY: SHX125

## 2016-11-24 LAB — TYPE AND SCREEN
ABO/RH(D): AB POS
Antibody Screen: NEGATIVE

## 2016-11-24 SURGERY — ARTHROPLASTY, KNEE, TOTAL
Anesthesia: Regional | Site: Knee | Laterality: Right

## 2016-11-24 MED ORDER — HYDROMORPHONE HCL 1 MG/ML IJ SOLN
1.0000 mg | INTRAMUSCULAR | Status: DC | PRN
  Administered 2016-11-24 – 2016-11-25 (×6): 1 mg via INTRAVENOUS
  Filled 2016-11-24 (×6): qty 1

## 2016-11-24 MED ORDER — MEPERIDINE HCL 50 MG/ML IJ SOLN: 6.2500 mg | INTRAMUSCULAR | Status: DC | PRN

## 2016-11-24 MED ORDER — PROPOFOL 10 MG/ML IV BOLUS
INTRAVENOUS | Status: AC
  Filled 2016-11-24: qty 20

## 2016-11-24 MED ORDER — MAGNESIUM CITRATE PO SOLN: 1.0000 | Freq: Once | ORAL | Status: DC | PRN

## 2016-11-24 MED ORDER — TRANEXAMIC ACID 1000 MG/10ML IV SOLN
1000.0000 mg | INTRAVENOUS | Status: AC
  Administered 2016-11-24: 1000 mg via INTRAVENOUS
  Filled 2016-11-24: qty 1100

## 2016-11-24 MED ORDER — ONDANSETRON HCL 4 MG/2ML IJ SOLN: 4.0000 mg | Freq: Four times a day (QID) | INTRAMUSCULAR | Status: DC | PRN

## 2016-11-24 MED ORDER — PROMETHAZINE HCL 25 MG/ML IJ SOLN: 6.2500 mg | INTRAMUSCULAR | Status: DC | PRN

## 2016-11-24 MED ORDER — BUPIVACAINE IN DEXTROSE 0.75-8.25 % IT SOLN
INTRATHECAL | Status: DC | PRN
  Administered 2016-11-24: 2 mL via INTRATHECAL

## 2016-11-24 MED ORDER — PROPOFOL 10 MG/ML IV BOLUS
INTRAVENOUS | Status: AC
  Filled 2016-11-24: qty 60

## 2016-11-24 MED ORDER — ENOXAPARIN SODIUM 30 MG/0.3ML ~~LOC~~ SOLN
30.0000 mg | Freq: Two times a day (BID) | SUBCUTANEOUS | Status: DC
  Administered 2016-11-25 – 2016-11-26 (×3): 30 mg via SUBCUTANEOUS
  Filled 2016-11-24 (×3): qty 0.3

## 2016-11-24 MED ORDER — ALPRAZOLAM 0.5 MG PO TABS
0.5000 mg | ORAL_TABLET | Freq: Three times a day (TID) | ORAL | Status: DC | PRN
  Administered 2016-11-24 – 2016-11-26 (×5): 0.5 mg via ORAL
  Filled 2016-11-24 (×5): qty 1

## 2016-11-24 MED ORDER — KETOROLAC TROMETHAMINE 30 MG/ML IJ SOLN
INTRAMUSCULAR | Status: AC
  Filled 2016-11-24: qty 1

## 2016-11-24 MED ORDER — ASPIRIN EC 325 MG PO TBEC: 325.0000 mg | DELAYED_RELEASE_TABLET | Freq: Two times a day (BID) | ORAL | 0 refills | Status: DC

## 2016-11-24 MED ORDER — MENTHOL 3 MG MT LOZG: 1.0000 | LOZENGE | OROMUCOSAL | Status: DC | PRN

## 2016-11-24 MED ORDER — MIDAZOLAM HCL 2 MG/2ML IJ SOLN
INTRAMUSCULAR | Status: AC
  Filled 2016-11-24: qty 2

## 2016-11-24 MED ORDER — METOCLOPRAMIDE HCL 5 MG/ML IJ SOLN: 5.0000 mg | Freq: Three times a day (TID) | INTRAMUSCULAR | Status: DC | PRN

## 2016-11-24 MED ORDER — PHENOL 1.4 % MT LIQD: 1.0000 | OROMUCOSAL | Status: DC | PRN

## 2016-11-24 MED ORDER — DIPHENHYDRAMINE HCL 12.5 MG/5ML PO ELIX: 12.5000 mg | ORAL_SOLUTION | ORAL | Status: DC | PRN

## 2016-11-24 MED ORDER — DEXAMETHASONE SODIUM PHOSPHATE 10 MG/ML IJ SOLN
10.0000 mg | Freq: Once | INTRAMUSCULAR | Status: AC
  Administered 2016-11-24: 10 mg via INTRAVENOUS

## 2016-11-24 MED ORDER — SODIUM CHLORIDE 0.9 % IJ SOLN
INTRAMUSCULAR | Status: AC
  Filled 2016-11-24: qty 50

## 2016-11-24 MED ORDER — MIDAZOLAM HCL 2 MG/2ML IJ SOLN
1.0000 mg | INTRAMUSCULAR | Status: DC | PRN
  Administered 2016-11-24 (×2): 2 mg via INTRAVENOUS
  Filled 2016-11-24 (×2): qty 2

## 2016-11-24 MED ORDER — METHOCARBAMOL 1000 MG/10ML IJ SOLN
500.0000 mg | Freq: Four times a day (QID) | INTRAVENOUS | Status: DC | PRN
  Administered 2016-11-24: 500 mg via INTRAVENOUS
  Filled 2016-11-24: qty 550

## 2016-11-24 MED ORDER — CEFAZOLIN SODIUM-DEXTROSE 2-4 GM/100ML-% IV SOLN
2.0000 g | Freq: Four times a day (QID) | INTRAVENOUS | Status: AC
  Administered 2016-11-24 – 2016-11-25 (×2): 2 g via INTRAVENOUS
  Filled 2016-11-24 (×2): qty 100

## 2016-11-24 MED ORDER — LACTATED RINGERS IV SOLN
INTRAVENOUS | Status: DC
  Administered 2016-11-24 (×3): via INTRAVENOUS

## 2016-11-24 MED ORDER — FENTANYL CITRATE (PF) 100 MCG/2ML IJ SOLN
50.0000 ug | INTRAMUSCULAR | Status: AC | PRN
  Administered 2016-11-24 (×2): 50 ug via INTRAVENOUS
  Administered 2016-11-24 (×2): 25 ug via INTRAVENOUS

## 2016-11-24 MED ORDER — METOCLOPRAMIDE HCL 5 MG PO TABS: 5.0000 mg | ORAL_TABLET | Freq: Three times a day (TID) | ORAL | Status: DC | PRN

## 2016-11-24 MED ORDER — ONDANSETRON HCL 4 MG/2ML IJ SOLN
INTRAMUSCULAR | Status: AC
  Filled 2016-11-24: qty 2

## 2016-11-24 MED ORDER — SODIUM CHLORIDE 0.9 % IV SOLN
INTRAVENOUS | Status: DC
  Administered 2016-11-24 – 2016-11-25 (×2): via INTRAVENOUS

## 2016-11-24 MED ORDER — ALUM & MAG HYDROXIDE-SIMETH 200-200-20 MG/5ML PO SUSP: 30.0000 mL | ORAL | Status: DC | PRN

## 2016-11-24 MED ORDER — FENTANYL CITRATE (PF) 100 MCG/2ML IJ SOLN
INTRAMUSCULAR | Status: AC
  Filled 2016-11-24: qty 2

## 2016-11-24 MED ORDER — HYDROMORPHONE HCL 2 MG PO TABS: 2.0000 mg | ORAL_TABLET | ORAL | 0 refills | Status: DC | PRN

## 2016-11-24 MED ORDER — LOSARTAN POTASSIUM 50 MG PO TABS
50.0000 mg | ORAL_TABLET | Freq: Every day | ORAL | Status: DC
  Administered 2016-11-24 – 2016-11-25 (×2): 50 mg via ORAL
  Filled 2016-11-24 (×4): qty 1

## 2016-11-24 MED ORDER — EPHEDRINE SULFATE-NACL 50-0.9 MG/10ML-% IV SOSY
PREFILLED_SYRINGE | INTRAVENOUS | Status: DC | PRN
  Administered 2016-11-24 (×2): 5 mg via INTRAVENOUS

## 2016-11-24 MED ORDER — KETOROLAC TROMETHAMINE 30 MG/ML IJ SOLN
INTRAMUSCULAR | Status: DC | PRN
  Administered 2016-11-24: 30 mg

## 2016-11-24 MED ORDER — BUPIVACAINE HCL (PF) 0.25 % IJ SOLN
INTRAMUSCULAR | Status: DC | PRN
  Administered 2016-11-24: 30 mL

## 2016-11-24 MED ORDER — HYDROMORPHONE HCL 2 MG PO TABS
2.0000 mg | ORAL_TABLET | ORAL | Status: DC | PRN
  Administered 2016-11-24 (×2): 2 mg via ORAL
  Administered 2016-11-25 – 2016-11-26 (×8): 4 mg via ORAL
  Filled 2016-11-24: qty 2
  Filled 2016-11-24: qty 1
  Filled 2016-11-24 (×4): qty 2
  Filled 2016-11-24: qty 1
  Filled 2016-11-24 (×3): qty 2

## 2016-11-24 MED ORDER — FERROUS SULFATE 325 (65 FE) MG PO TABS
325.0000 mg | ORAL_TABLET | Freq: Three times a day (TID) | ORAL | Status: DC
  Administered 2016-11-25 – 2016-11-26 (×4): 325 mg via ORAL
  Filled 2016-11-24 (×4): qty 1

## 2016-11-24 MED ORDER — HYDROMORPHONE HCL 1 MG/ML IJ SOLN
0.2500 mg | INTRAMUSCULAR | Status: DC | PRN
  Administered 2016-11-24 (×2): 0.5 mg via INTRAVENOUS

## 2016-11-24 MED ORDER — ACETAMINOPHEN 650 MG RE SUPP: 650.0000 mg | Freq: Four times a day (QID) | RECTAL | Status: DC | PRN

## 2016-11-24 MED ORDER — BISACODYL 5 MG PO TBEC: 5.0000 mg | DELAYED_RELEASE_TABLET | Freq: Every day | ORAL | Status: DC | PRN

## 2016-11-24 MED ORDER — MIDAZOLAM HCL 2 MG/2ML IJ SOLN
INTRAMUSCULAR | Status: AC
  Administered 2016-11-24: 2 mg via INTRAVENOUS
  Filled 2016-11-24: qty 2

## 2016-11-24 MED ORDER — CEFAZOLIN SODIUM-DEXTROSE 2-4 GM/100ML-% IV SOLN
2.0000 g | INTRAVENOUS | Status: AC
  Administered 2016-11-24: 2 g via INTRAVENOUS
  Filled 2016-11-24: qty 100

## 2016-11-24 MED ORDER — HYDROCHLOROTHIAZIDE 12.5 MG PO CAPS
12.5000 mg | ORAL_CAPSULE | Freq: Every day | ORAL | Status: DC
  Administered 2016-11-24 – 2016-11-25 (×2): 12.5 mg via ORAL
  Filled 2016-11-24 (×4): qty 1

## 2016-11-24 MED ORDER — DEXAMETHASONE SODIUM PHOSPHATE 10 MG/ML IJ SOLN
10.0000 mg | Freq: Once | INTRAMUSCULAR | Status: AC
  Administered 2016-11-25: 10 mg via INTRAVENOUS
  Filled 2016-11-24: qty 1

## 2016-11-24 MED ORDER — PROPOFOL 500 MG/50ML IV EMUL
INTRAVENOUS | Status: DC | PRN
  Administered 2016-11-24: 75 ug/kg/min via INTRAVENOUS

## 2016-11-24 MED ORDER — DOCUSATE SODIUM 100 MG PO CAPS
100.0000 mg | ORAL_CAPSULE | Freq: Two times a day (BID) | ORAL | Status: DC
  Administered 2016-11-24 – 2016-11-26 (×4): 100 mg via ORAL
  Filled 2016-11-24 (×4): qty 1

## 2016-11-24 MED ORDER — METHOCARBAMOL 500 MG PO TABS: 500.0000 mg | ORAL_TABLET | Freq: Four times a day (QID) | ORAL | Status: DC | PRN

## 2016-11-24 MED ORDER — METHOCARBAMOL 500 MG PO TABS: 500.0000 mg | ORAL_TABLET | Freq: Three times a day (TID) | ORAL | 2 refills | Status: DC | PRN

## 2016-11-24 MED ORDER — LOSARTAN POTASSIUM-HCTZ 50-12.5 MG PO TABS: 1.0000 | ORAL_TABLET | Freq: Every day | ORAL | Status: DC

## 2016-11-24 MED ORDER — POLYETHYLENE GLYCOL 3350 17 G PO PACK: 17.0000 g | PACK | Freq: Every day | ORAL | Status: DC | PRN

## 2016-11-24 MED ORDER — PROPOFOL 10 MG/ML IV BOLUS
INTRAVENOUS | Status: DC | PRN
Start: 2016-11-24 — End: 2016-11-24
  Administered 2016-11-24: 30 mg via INTRAVENOUS

## 2016-11-24 MED ORDER — ACETAMINOPHEN 325 MG PO TABS
650.0000 mg | ORAL_TABLET | Freq: Four times a day (QID) | ORAL | Status: DC | PRN
  Administered 2016-11-24 – 2016-11-25 (×3): 650 mg via ORAL
  Filled 2016-11-24 (×3): qty 2

## 2016-11-24 MED ORDER — HYDROMORPHONE HCL 1 MG/ML IJ SOLN
INTRAMUSCULAR | Status: AC
  Filled 2016-11-24: qty 1

## 2016-11-24 MED ORDER — ONDANSETRON HCL 4 MG PO TABS: 4.0000 mg | ORAL_TABLET | Freq: Four times a day (QID) | ORAL | Status: DC | PRN

## 2016-11-24 MED ORDER — FENTANYL CITRATE (PF) 100 MCG/2ML IJ SOLN
INTRAMUSCULAR | Status: AC
  Administered 2016-11-24: 50 ug via INTRAVENOUS
  Filled 2016-11-24: qty 2

## 2016-11-24 MED ORDER — SODIUM CHLORIDE 0.9 % IJ SOLN
INTRAMUSCULAR | Status: DC | PRN
  Administered 2016-11-24: 30 mL

## 2016-11-24 MED ORDER — SODIUM CHLORIDE 0.9 % IR SOLN
Status: DC | PRN
  Administered 2016-11-24: 1000 mL

## 2016-11-24 MED ORDER — ONDANSETRON HCL 4 MG/2ML IJ SOLN
INTRAMUSCULAR | Status: DC | PRN
  Administered 2016-11-24: 4 mg via INTRAVENOUS

## 2016-11-24 MED ORDER — BUPIVACAINE HCL (PF) 0.25 % IJ SOLN
INTRAMUSCULAR | Status: AC
  Filled 2016-11-24: qty 30

## 2016-11-24 MED ORDER — POVIDONE-IODINE 7.5 % EX SOLN: Freq: Once | CUTANEOUS | Status: DC

## 2016-11-24 SURGICAL SUPPLY — 58 items
ADH SKN CLS APL DERMABOND .7 (GAUZE/BANDAGES/DRESSINGS) ×1
BAG DECANTER FOR FLEXI CONT (MISCELLANEOUS) IMPLANT
BAG ZIPLOCK 12X15 (MISCELLANEOUS) ×4 IMPLANT
BANDAGE ACE 4X5 VEL STRL LF (GAUZE/BANDAGES/DRESSINGS) ×2 IMPLANT
BANDAGE ACE 6X5 VEL STRL LF (GAUZE/BANDAGES/DRESSINGS) ×2 IMPLANT
BLADE SAG 18X100X1.27 (BLADE) ×2 IMPLANT
BLADE SAW SGTL 13.0X1.19X90.0M (BLADE) ×2 IMPLANT
BOWL SMART MIX CTS (DISPOSABLE) ×2 IMPLANT
CAP KNEE TOTAL 3 SIGMA ×2 IMPLANT
CEMENT HV SMART SET (Cement) ×4 IMPLANT
COVER SURGICAL LIGHT HANDLE (MISCELLANEOUS) ×2 IMPLANT
CUFF TOURN SGL QUICK 34 (TOURNIQUET CUFF) ×1
CUFF TRNQT CYL 34X4X40X1 (TOURNIQUET CUFF) ×1 IMPLANT
DECANTER SPIKE VIAL GLASS SM (MISCELLANEOUS) ×2 IMPLANT
DERMABOND ADVANCED (GAUZE/BANDAGES/DRESSINGS) ×1
DERMABOND ADVANCED .7 DNX12 (GAUZE/BANDAGES/DRESSINGS) ×1 IMPLANT
DRAPE U-SHAPE 47X51 STRL (DRAPES) ×2 IMPLANT
DRSG AQUACEL AG ADV 3.5X10 (GAUZE/BANDAGES/DRESSINGS) ×2 IMPLANT
DRSG TEGADERM 4X4.75 (GAUZE/BANDAGES/DRESSINGS) ×2 IMPLANT
DURAPREP 26ML APPLICATOR (WOUND CARE) ×4 IMPLANT
ELECT REM PT RETURN 15FT ADLT (MISCELLANEOUS) ×2 IMPLANT
EVACUATOR 1/8 PVC DRAIN (DRAIN) ×2 IMPLANT
GAUZE SPONGE 2X2 8PLY STRL LF (GAUZE/BANDAGES/DRESSINGS) ×1 IMPLANT
GAUZE XEROFORM 2X2 STRL (GAUZE/BANDAGES/DRESSINGS) ×2 IMPLANT
GLOVE BIOGEL PI IND STRL 8 (GLOVE) ×2 IMPLANT
GLOVE BIOGEL PI INDICATOR 8 (GLOVE) ×2
GLOVE ECLIPSE 8.0 STRL XLNG CF (GLOVE) ×4 IMPLANT
GLOVE SURG ORTHO 9.0 STRL STRW (GLOVE) ×2 IMPLANT
GLOVE SURG SS PI 7.5 STRL IVOR (GLOVE) ×2 IMPLANT
GOWN STRL REUS W/TWL XL LVL3 (GOWN DISPOSABLE) ×4 IMPLANT
HANDPIECE INTERPULSE COAX TIP (DISPOSABLE) ×2
IMMOBILIZER KNEE 20 (SOFTGOODS) ×2
IMMOBILIZER KNEE 20 THIGH 36 (SOFTGOODS) ×1 IMPLANT
NS IRRIG 1000ML POUR BTL (IV SOLUTION) ×2 IMPLANT
PACK TOTAL KNEE CUSTOM (KITS) ×2 IMPLANT
POSITIONER SURGICAL ARM (MISCELLANEOUS) ×2 IMPLANT
SET HNDPC FAN SPRY TIP SCT (DISPOSABLE) ×1 IMPLANT
SET PAD KNEE POSITIONER (MISCELLANEOUS) ×2 IMPLANT
SPONGE GAUZE 2X2 STER 10/PKG (GAUZE/BANDAGES/DRESSINGS) ×1
SPONGE LAP 18X18 X RAY DECT (DISPOSABLE) IMPLANT
SPONGE SURGIFOAM ABS GEL 100 (HEMOSTASIS) ×2 IMPLANT
STOCKINETTE 6  STRL (DRAPES) ×1
STOCKINETTE 6 STRL (DRAPES) ×1 IMPLANT
SUCTION FRAZIER HANDLE 12FR (TUBING) ×1
SUCTION TUBE FRAZIER 12FR DISP (TUBING) ×1 IMPLANT
SUT BONE WAX W31G (SUTURE) IMPLANT
SUT MNCRL AB 3-0 PS2 18 (SUTURE) ×2 IMPLANT
SUT VIC AB 1 CT1 27 (SUTURE) ×8
SUT VIC AB 1 CT1 27XBRD ANTBC (SUTURE) ×4 IMPLANT
SUT VIC AB 2-0 CT1 27 (SUTURE) ×4
SUT VIC AB 2-0 CT1 TAPERPNT 27 (SUTURE) ×2 IMPLANT
SUT VLOC 180 0 24IN GS25 (SUTURE) ×2 IMPLANT
SYR 50ML LL SCALE MARK (SYRINGE) ×2 IMPLANT
TAPE STRIPS DRAPE STRL (GAUZE/BANDAGES/DRESSINGS) ×2 IMPLANT
TRAY FOLEY W/METER SILVER 16FR (SET/KITS/TRAYS/PACK) ×2 IMPLANT
WATER STERILE IRR 1500ML POUR (IV SOLUTION) ×4 IMPLANT
WRAP KNEE MAXI GEL POST OP (GAUZE/BANDAGES/DRESSINGS) ×2 IMPLANT
YANKAUER SUCT BULB TIP 10FT TU (MISCELLANEOUS) ×2 IMPLANT

## 2016-11-24 NOTE — Anesthesia Postprocedure Evaluation (Signed)
Anesthesia Post Note  Patient: Paul Morrison  Procedure(s) Performed: Procedure(s) (LRB): RIGHT TOTAL KNEE ARTHROPLASTY (Right)  Patient location during evaluation: PACU Anesthesia Type: Regional and Spinal Level of consciousness: sedated and patient cooperative Pain management: pain level controlled Vital Signs Assessment: post-procedure vital signs reviewed and stable Respiratory status: spontaneous breathing Cardiovascular status: stable Anesthetic complications: no       Last Vitals:  Vitals:   11/24/16 1620 11/24/16 1719  BP: 132/86 (!) 141/83  Pulse: 62 72  Resp: 20 20  Temp: 36.7 C 36.7 C    Last Pain:  Vitals:   11/24/16 1719  TempSrc: Oral  PainSc:                  Nolon Nations

## 2016-11-24 NOTE — Anesthesia Procedure Notes (Signed)
Anesthesia Regional Block: Adductor canal block   Pre-Anesthetic Checklist: ,, timeout performed, Correct Patient, Correct Site, Correct Laterality, Correct Procedure, Correct Position, site marked, Risks and benefits discussed,  Surgical consent,  Pre-op evaluation,  At surgeon's request and post-op pain management  Laterality: Right  Prep: chloraprep       Needles:  Injection technique: Single-shot  Needle Type: Stimiplex     Needle Length: 9cm  Needle Gauge: 21     Additional Needles:   Procedures: ultrasound guided,,,,,,,,  Narrative:  Start time: 11/24/2016 12:10 PM End time: 11/24/2016 12:13 PM Injection made incrementally with aspirations every 5 mL.  Performed by: Personally  Anesthesiologist: Nolon Nations  Additional Notes: BP cuff, EKG monitors applied. Sedation begun. Artery and nerve location verified with U/S and anesthetic injected incrementally, slowly, and after negative aspirations under direct u/s guidance. Good fascial /perineural spread. Tolerated well.

## 2016-11-24 NOTE — Anesthesia Procedure Notes (Signed)
Spinal  Patient location during procedure: OR Start time: 11/24/2016 12:20 PM End time: 11/24/2016 12:28 PM Staffing Resident/CRNA: Ayodeji Keimig Performed: resident/CRNA  Preanesthetic Checklist Completed: patient identified, site marked, surgical consent, pre-op evaluation, timeout performed, IV checked, risks and benefits discussed and monitors and equipment checked Spinal Block Patient position: sitting Prep: Betadine Patient monitoring: heart rate, cardiac monitor, continuous pulse ox and blood pressure Approach: midline Location: L3-4 Injection technique: single-shot Needle Needle type: Spinocan and Sprotte  Needle gauge: 24 G Needle length: 10 cm Needle insertion depth: 7 cm Assessment Sensory level: T6

## 2016-11-24 NOTE — Interval H&P Note (Signed)
History and Physical Interval Note:  11/24/2016 12:07 PM  Paul Morrison  has presented today for surgery, with the diagnosis of Right knee osteoarthritis  The various methods of treatment have been discussed with the patient and family. After consideration of risks, benefits and other options for treatment, the patient has consented to  Procedure(s): RIGHT TOTAL KNEE ARTHROPLASTY (Right) as a surgical intervention .  The patient's history has been reviewed, patient examined, no change in status, stable for surgery.  I have reviewed the patient's chart and labs.  Questions were answered to the patient's satisfaction.     Daniel Ritthaler ANDREW

## 2016-11-24 NOTE — Transfer of Care (Signed)
Immediate Anesthesia Transfer of Care Note  Patient: Paul Morrison  Procedure(s) Performed: Procedure(s): RIGHT TOTAL KNEE ARTHROPLASTY (Right)  Patient Location: PACU  Anesthesia Type:Spinal  Level of Consciousness:  sedated, patient cooperative and responds to stimulation  Airway & Oxygen Therapy:Patient Spontanous Breathing and Patient connected to face mask oxgen  Post-op Assessment:  Report given to PACU RN and Post -op Vital signs reviewed and stable  Post vital signs:  Reviewed and stable  Last Vitals:  Vitals:   11/24/16 1212 11/24/16 1214  BP:    Pulse: 67 65  Resp: 10 (!) 7  Temp:      Complications: No apparent anesthesia complications

## 2016-11-24 NOTE — Anesthesia Preprocedure Evaluation (Addendum)
Anesthesia Evaluation  Patient identified by MRN, date of birth, ID band Patient awake    Reviewed: Allergy & Precautions, NPO status , Patient's Chart, lab work & pertinent test results  Airway Mallampati: II  TM Distance: >3 FB Neck ROM: Full    Dental no notable dental hx.    Pulmonary former smoker,    Pulmonary exam normal breath sounds clear to auscultation       Cardiovascular hypertension, Pt. on medications Normal cardiovascular exam Rhythm:Regular Rate:Normal     Neuro/Psych negative neurological ROS  negative psych ROS   GI/Hepatic negative GI ROS, Neg liver ROS,   Endo/Other  negative endocrine ROS  Renal/GU negative Renal ROS     Musculoskeletal negative musculoskeletal ROS (+)   Abdominal   Peds  Hematology negative hematology ROS (+)   Anesthesia Other Findings   Reproductive/Obstetrics                             Anesthesia Physical Anesthesia Plan  ASA: II  Anesthesia Plan: Spinal and Regional   Post-op Pain Management:    Induction:   Airway Management Planned:   Additional Equipment:   Intra-op Plan:   Post-operative Plan:   Informed Consent: I have reviewed the patients History and Physical, chart, labs and discussed the procedure including the risks, benefits and alternatives for the proposed anesthesia with the patient or authorized representative who has indicated his/her understanding and acceptance.   Dental advisory given  Plan Discussed with: CRNA  Anesthesia Plan Comments:         Anesthesia Quick Evaluation

## 2016-11-24 NOTE — Progress Notes (Signed)
Assisted Dr. Germeroth with right, ultrasound guided, adductor canal block. Side rails up, monitors on throughout procedure. See vital signs in flow sheet. Tolerated Procedure well. 

## 2016-11-24 NOTE — Op Note (Signed)
DATE OF SURGERY:  11/24/2016  TIME: 2:08 PM  PATIENT NAME:  Paul Morrison    AGE: 61 y.o.   PRE-OPERATIVE DIAGNOSIS:  Right knee osteoarthritis  POST-OPERATIVE DIAGNOSIS:  Right knee osteoarthritis  PROCEDURE:  Procedure(s): RIGHT TOTAL KNEE ARTHROPLASTY  SURGEON:  Valborg Friar ANDREW  ASSISTANT:  Bryson Stilwell, PA-C, present and scrubbed throughout the case, critical for assistance with exposure, retraction, instrumentation, and closure.  OPERATIVE IMPLANTS: Depuy PFC Sigma Rotating Platform.  Femur size 5, Tibia size 5, Patella size 41 3-peg oval button, with a 12.5 mm polyethylene insert.   PREOPERATIVE INDICATIONS:   Paul Morrison is a 61 y.o. year old male with end stage bone on bone arthritis of the knee who failed conservative treatment and elected for Total Knee Arthroplasty.   The risks, benefits, and alternatives were discussed at length including but not limited to the risks of infection, bleeding, nerve injury, stiffness, blood clots, the need for revision surgery, cardiopulmonary complications, among others, and they were willing to proceed.  OPERATIVE DESCRIPTION:  The patient was brought to the operative room and placed in a supine position.  Spinal anesthesia was administered.  IV antibiotics were given.  The lower extremity was prepped and draped in the usual sterile fashion.  Time out was performed.  The leg was elevated and exsanguinated and the tourniquet was inflated.  Anterior quadriceps tendon splitting approach was performed.  The patella was retracted and osteophytes were removed.  The anterior horn of the medial and lateral meniscus was removed and cruciate ligaments resected.   The distal femur was opened with the drill and the intramedullary distal femoral cutting jig was utilized, set at 5 degrees resecting 10 mm off the distal femur.  Care was taken to protect the collateral ligaments.  The distal femoral sizing jig was applied, taking care  to avoid notching.  Then the 4-in-1 cutting jig was applied and the anterior and posterior femur was cut, along with the chamfer cuts.    Then the extramedullary tibial cutting jig was utilized making the appropriate cut using the anterior tibial crest as a reference building in appropriate posterior slope.  Care was taken during the cut to protect the medial and collateral ligaments.  The proximal tibia was removed along with the posterior horns of the menisci.   The posterior medial femoral osteophytes and posterior lateral femoral osteophytes were removed.    The flexion gap was then measured and was symmetric with the extension gap, measured at 12.  I completed the distal femoral preparation using the appropriate jig to prepare the box.  The patella was then measured, and cut with the saw.    The proximal tibia sized and prepared accordingly with the reamer and the punch, and then all components were trialed with the trial insert.  The knee was found to have excellent balance and full motion.    The above named components were then cemented into place and all excess cement was removed.  The trial polyethylene component was in place during cementation, and then was exchanged for the real polyethylene component.    The knee was easily taken through a range of motion and the patella tracked well and the knee irrigated copiously and the parapatellar and subcutaneous tissue closed with vicryl, and monocryl with steri strips for the skin.  The arthrotomy was closed at 90 of flexion. The wounds were dressed with sterile gauze and the tourniquet released and the patient was awakened and returned to the PACU  in stable and satisfactory condition.  There were no complications.  Total tourniquet time was 85 minutes.

## 2016-11-24 NOTE — Interval H&P Note (Signed)
History and Physical Interval Note:  11/24/2016 12:07 PM  [name]  has presented today for surgery, with the diagnosis of Right knee osteoarthritis  The various methods of treatment have been discussed with the patient and family. After consideration of risks, benefits and other options for treatment, the patient has consented to  Procedure(s): RIGHT TOTAL KNEE ARTHROPLASTY (Right) as a surgical intervention .  The patient's history has been reviewed, patient examined, no change in status, stable for surgery.  I have reviewed the patient's chart and labs.  Questions were answered to the patient's satisfaction.     Calder Oblinger ANDREW

## 2016-11-25 ENCOUNTER — Encounter (HOSPITAL_COMMUNITY): Payer: Self-pay

## 2016-11-25 LAB — BASIC METABOLIC PANEL
Anion gap: 7 (ref 5–15)
BUN: 15 mg/dL (ref 6–20)
CALCIUM: 8.7 mg/dL — AB (ref 8.9–10.3)
CO2: 25 mmol/L (ref 22–32)
CREATININE: 0.93 mg/dL (ref 0.61–1.24)
Chloride: 102 mmol/L (ref 101–111)
GFR calc Af Amer: >60 mL/min (ref >60)
GFR calc non Af Amer: >60 mL/min (ref >60)
GLUCOSE: 140 mg/dL — AB (ref 65–99)
POTASSIUM: 4.1 mmol/L (ref 3.5–5.1)
Sodium: 134 mmol/L — ABNORMAL LOW (ref 135–145)

## 2016-11-25 LAB — CBC
HCT: 41.3 % (ref 39.0–52.0)
Hemoglobin: 14.3 g/dL (ref 13.0–17.0)
MCH: 31.2 pg (ref 26.0–34.0)
MCHC: 34.6 g/dL (ref 30.0–36.0)
MCV: 90 fL (ref 78.0–100.0)
PLATELETS: 183 10*3/uL (ref 150–400)
RBC: 4.59 MIL/uL (ref 4.22–5.81)
RDW: 12.6 % (ref 11.5–15.5)
WBC: 19.2 10*3/uL — ABNORMAL HIGH (ref 4.0–10.5)

## 2016-11-25 NOTE — Evaluation (Signed)
Occupational Therapy Evaluation Patient Details Name: GEROD CALIGIURI MRN: [id number] DOB: Jan 14, 1956 Today's Date: 11/25/2016    History of Present Illness RIGHT TOTAL KNEE ARTHROPLASTY (Right)    Clinical Impression   Pt is s/p TKA resulting in the deficits listed below (see OT Problem List).  Pt will benefit from skilled OT to increase their safety and independence with ADL and functional mobility for ADL to facilitate discharge to venue listed below.        Follow Up Recommendations  No OT follow up          Precautions / Restrictions Precautions Precautions: Knee      Mobility Bed Mobility Overal bed mobility: Needs Assistance Bed Mobility: Supine to Sit     Supine to sit: Min assist        Transfers Overall transfer level: Needs assistance Equipment used: Rolling walker (2 wheeled) Transfers: Sit to/from [facility] Sit to Stand: Min assist Stand pivot transfers: Min assist       General transfer comment: VC for technique        ADL either performed or assessed with clinical judgement   ADL Overall ADL's : Needs assistance/impaired Eating/Feeding: Independent;Sitting   Grooming: Set up;Sitting   Upper Body Bathing: Set up   Lower Body Bathing: Moderate assistance;Sit to/from stand;Cueing for safety;Cueing for sequencing;Cueing for compensatory techniques       Lower Body Dressing: Moderate assistance;Sit to/from stand;Cueing for safety;Cueing for sequencing   Toilet Transfer: Minimal assistance;Cueing for safety;Cueing for sequencing Toilet Transfer Details (indicate cue type and reason): sit to stand Toileting- Clothing Manipulation and Hygiene: Minimal assistance;Sit to/from stand;Cueing for safety;Cueing for sequencing         General ADL Comments: wife will A as needed                  Pertinent Vitals/Pain Pain Assessment: 0-10 Pain Score: 2  Pain Descriptors / Indicators: Sore Pain Intervention(s): Ice  applied     Hand Dominance     Extremity/Trunk Assessment Upper Extremity Assessment Upper Extremity Assessment: Overall WFL for tasks assessed           Communication Communication Communication: No difficulties   Cognition Arousal/Alertness: Awake/alert Behavior During Therapy: WFL for tasks assessed/performed Overall Cognitive Status: Within Functional Limits for tasks assessed                                                Home Living Family/patient expects to be discharged to:: Private residence Living Arrangements: Spouse/significant other Available Help at Discharge: Family Type of Home: House Home Access: Stairs to enter [employer] of Steps: 3 going down but can around walk around.    Home Layout: Two level;Full bath on main level;Able to live on main level with bedroom/bathroom     Bathroom Shower/Tub: Tub/shower unit;Walk-in shower   Bathroom Toilet: Standard     Home Equipment: Toilet riser;Walker - 4 wheels;Bedside commode          Prior Functioning/Environment Level of Independence: Independent                 OT Problem List: Decreased strength;Decreased activity tolerance;Decreased knowledge of use of DME or AE      OT Treatment/Interventions: Self-care/ADL training;DME and/or AE instruction;Patient/family education    OT Goals(Current goals can be found in the care plan section) Acute Rehab  OT Goals Patient Stated Goal: be able to bike and be active OT Goal Formulation: With patient Time For Goal Achievement: 12/09/16 Potential to Achieve Goals: Good  OT Frequency: Min 2X/week   Barriers to D/C:               End of Session Equipment Utilized During Treatment: Rolling walker Nurse Communication: Mobility status  Activity Tolerance: Patient tolerated treatment well;Patient limited by fatigue Patient left: in chair  OT Visit Diagnosis: Unsteadiness on feet (R26.81)                Time:  0802-2336 OT Time Calculation (min): 46 min Charges:  OT General Charges $OT Visit: 1 Procedure OT Evaluation $OT Eval Low Complexity: 1 Procedure OT Treatments $Self Care/Home Management : 23-37 mins G-Codes:     Kari Baars, OT [phone]  Payton Mccallum D 11/25/2016, 9:17 AM

## 2016-11-25 NOTE — Progress Notes (Signed)
Physical Therapy Treatment Patient Details Name: Paul Morrison MRN: [id number] DOB: [birth year] Today's Date: 11/25/2016    History of Present Illness s/p RTKA , with history of lumbar fusion     PT Comments    Pt tolerated session well today. Walked a little further and introduced more of the exercises including going over handout as well. Will need step training in the morning.    Follow Up Recommendations  Home health PT (or OPPT depending on MD)     Equipment Recommendations  None recommended by PT (has equipment already )    Recommendations for Other Services       Precautions / Restrictions Precautions Precautions: Knee Restrictions Weight Bearing Restrictions: No    Mobility  Bed Mobility Overal bed mobility: Needs Assistance Bed Mobility: Supine to Sit     Supine to sit: Min guard     General bed mobility comments: pt assisted his leg most of the way out of the bed.   Transfers Overall transfer level: Needs assistance Equipment used: Rolling walker (2 wheeled) Transfers: Sit to/from Stand Sit to Stand: Supervision Stand pivot transfers: Supervision       General transfer comment: VC for technique, and safety with RW. Tolerated well today!  Ambulation/Gait Ambulation/Gait assistance: Min guard Ambulation Distance (Feet): 70 Feet Assistive device: Rolling walker (2 wheeled) Gait Pattern/deviations: Step-through pattern     General Gait Details: improved steps this afternoon    Stairs            Wheelchair Mobility    Modified Rankin (Stroke Patients Only)       Balance                                            Cognition Arousal/Alertness: Awake/alert Behavior During Therapy: WFL for tasks assessed/performed Overall Cognitive Status: Within Functional Limits for tasks assessed                                        Exercises Total Joint Exercises Ankle Circles/Pumps: AROM;10  reps;Both;Supine Quad Sets: AROM;Right;10 reps Short Arc Quad: AROM;Right;Supine;10 reps Heel Slides: AAROM;Right;10 reps (also seated knee flexion with foot flat on floor, gentle stretches. ) Straight Leg Raises: AROM;10 reps;Right;Supine Long Arc Quad: AROM;10 reps;Right;Seated Goniometric ROM: 0-80 seated flexion stretches    General Comments        Pertinent Vitals/Pain Pain Score: 3  Pain Location: R knee, some posteriorly as well  Pain Descriptors / Indicators: Sore Pain Intervention(s): Monitored during session;Ice applied    Home Living                      Prior Function            PT Goals (current goals can now be found in the care plan section) Acute Rehab PT Goals Patient Stated Goal: be able to bike and be active again  PT Goal Formulation: With patient Time For Goal Achievement: 12/09/16 Potential to Achieve Goals: Good Progress towards PT goals: Progressing toward goals    Frequency    7X/week      PT Plan      Co-evaluation             End of Session Equipment Utilized During Treatment: Gait belt Activity Tolerance:  Patient tolerated treatment well Patient left: with call bell/phone within reach;with chair alarm set;with family/visitor present;in chair Nurse Communication: Mobility status PT Visit Diagnosis: Unsteadiness on feet (R26.81)     Time: 3735-7897 PT Time Calculation (min) (ACUTE ONLY): 52 min  Charges:  $Gait Training: 8-22 mins $Therapeutic Exercise: 23-37 mins                    G Codes:       Clide Dales, PT Pager: [phone] 11/25/2016    [place] Arch, Gatha Mayer 11/25/2016, 5:40 PM

## 2016-11-25 NOTE — Progress Notes (Addendum)
     Subjective: 1 Day Post-Op Procedure(s) (LRB): RIGHT TOTAL KNEE ARTHROPLASTY (Right)   Patient reports pain as mild, pain well controlled. No events throughout there night.  Questions encouraged and answered.  Objective:   VITALS:   Vitals:   11/24/16 2153 11/25/16 0135  BP: 127/68 127/67  Pulse: 82 73  Resp: 20 20  Temp: 97.8 F (36.6 C) 97.7 F (36.5 C)    Dorsiflexion/Plantar flexion intact Incision: dressing C/D/I No cellulitis present Compartment soft  LABS  Recent Labs  11/25/16 0453  HGB 14.3  HCT 41.3  WBC 19.2*  PLT 183     Recent Labs  11/25/16 0453  NA 134*  K 4.1  BUN 15  CREATININE 0.93  GLUCOSE 140*     Assessment/Plan: 1 Day Post-Op Procedure(s) (LRB): RIGHT TOTAL KNEE ARTHROPLASTY (Right) Hemo Vac drain pulled and pressure dressing placed, no active bleeding. Foley cath d/c'ed Advance diet Up with therapy Discharge home eventually when ready, probably Sunday.   [place] Pugh Lynasia Meloche   PAC  11/25/2016, 7:41 AM

## 2016-11-25 NOTE — Evaluation (Signed)
Physical Therapy Evaluation Patient Details Name: Paul Morrison MRN: [id number] DOB: 12-06-1955 Today's Date: 11/25/2016   History of Present Illness  s/p RTKA , with history of lumbar fusion   Clinical Impression  Pt is s/p TKA resulting in the deficits listed below (see PT Problem List). Pt will benefit from acute PT to increase their independence and safety with mobility to allow discharge home.      Follow Up Recommendations Home health PT (or OPPT depending on MD)    Equipment Recommendations  None recommended by PT (has equipment already )    Recommendations for Other Services       Precautions / Restrictions Precautions Precautions: Knee Restrictions Weight Bearing Restrictions: No      Mobility  Bed Mobility Overal bed mobility: Needs Assistance Bed Mobility: Supine to Sit     Supine to sit: Min guard     General bed mobility comments: pt assisted his leg most of the way out of the bed.   Transfers Overall transfer level: Needs assistance Equipment used: Rolling walker (2 wheeled) Transfers: Sit to/from [facility] Sit to Stand: Min guard Stand pivot transfers: Min guard       General transfer comment: VC for technique, and safety with RW. Tolerated well today!  Ambulation/Gait Ambulation/Gait assistance: Min guard Ambulation Distance (Feet): 50 Feet Assistive device: Rolling walker (2 wheeled) Gait Pattern/deviations: Step-to pattern     General Gait Details: cues for step to patern and safety with RW. tolerated well   Stairs            Wheelchair Mobility    Modified Rankin (Stroke Patients Only)       Balance                                             Pertinent Vitals/Pain Pain Score: 2  Pain Location: R knee, some posteriorly as well  Pain Descriptors / Indicators: Sore Pain Intervention(s): Monitored during session;Ice applied    Home Living Family/patient expects to be discharged  to:: Private residence Living Arrangements: Spouse/significant other Available Help at Discharge: Family Type of Home: House Home Access: Stairs to enter   [employer] of Steps: 3 going down but can around walk around. Only has 1 step in front of house with a rail  Home Layout: Two level;Full bath on main level;Able to live on main level with bedroom/bathroom Home Equipment: Toilet riser;Walker - 4 wheels;Bedside commode;Walker - 2 wheels      Prior Function Level of Independence: Independent         Comments: still working , but on his feet alot, and climbing ladders     Hand Dominance        Extremity/Trunk Assessment        Lower Extremity Assessment Lower Extremity Assessment: Overall WFL for tasks assessed (able to perform R SLR 10times without lag today )       Communication   Communication: No difficulties  Cognition Arousal/Alertness: Awake/alert Behavior During Therapy: WFL for tasks assessed/performed Overall Cognitive Status: Within Functional Limits for tasks assessed                                        General Comments      Exercises Total Joint Exercises Ankle  Circles/Pumps: AROM;10 reps;Both;Supine Quad Sets: AROM;Right;10 reps Short Arc Quad: AROM;Right;Supine;10 reps Heel Slides: AAROM;Right;10 reps (also seated knee flexion with foot flat on floor, gentle stretches. ) Goniometric ROM: 0-70   Assessment/Plan    PT Assessment Patient needs continued PT services  PT Problem List Decreased strength;Decreased activity tolerance;Decreased mobility       PT Treatment Interventions DME instruction;Gait training;Stair training;Functional mobility training;Therapeutic exercise;Therapeutic activities;Patient/family education    PT Goals (Current goals can be found in the Care Plan section)  Acute Rehab PT Goals Patient Stated Goal: be able to bike and be active again  PT Goal Formulation: With patient Time For Goal  Achievement: 12/09/16 Potential to Achieve Goals: Good    Frequency 7X/week   Barriers to discharge        Co-evaluation               End of Session Equipment Utilized During Treatment: Gait belt Activity Tolerance: Patient tolerated treatment well Patient left: in bed;with call bell/phone within reach;with chair alarm set;with family/visitor present Nurse Communication: Mobility status PT Visit Diagnosis: Unsteadiness on feet (R26.81)    Time: 1110-1200 PT Time Calculation (min) (ACUTE ONLY): 50 min   Charges:   PT Evaluation $PT Eval Low Complexity: 1 Procedure PT Treatments $Gait Training: 8-22 mins $Therapeutic Exercise: 8-22 mins   PT G Codes:        Clide Dales, PT Pager: [phone] 11/25/2016   Paul Morrison, Paul Morrison 11/25/2016, 1:46 PM

## 2016-11-26 LAB — CBC
HEMATOCRIT: 39.3 % (ref 39.0–52.0)
Hemoglobin: 13.4 g/dL (ref 13.0–17.0)
MCH: 31.2 pg (ref 26.0–34.0)
MCHC: 34.1 g/dL (ref 30.0–36.0)
MCV: 91.4 fL (ref 78.0–100.0)
Platelets: 174 10*3/uL (ref 150–400)
RBC: 4.3 MIL/uL (ref 4.22–5.81)
RDW: 12.8 % (ref 11.5–15.5)
WBC: 15.5 10*3/uL — ABNORMAL HIGH (ref 4.0–10.5)

## 2016-11-26 MED ORDER — ALPRAZOLAM 0.5 MG PO TABS: 0.5000 mg | ORAL_TABLET | Freq: Three times a day (TID) | ORAL | 0 refills | Status: DC | PRN

## 2016-11-26 NOTE — Progress Notes (Signed)
Nurse reviewed discharge instructions with pt.  Pt verbalized understanding of discharge instructions, follow up appointment and new medications. Prescriptions given to pt prior to discharge. All questions answered at time of discharge.

## 2016-11-26 NOTE — Discharge Summary (Signed)
Patient ID: Paul Morrison MRN: [id number] DOB/AGE: July 24, 1956 61 y.o.  Admit date: 11/24/2016 Discharge date:   Primary Diagnosis: Right knee OA  Admission Diagnoses:  Past Medical History:  Diagnosis Date  . Hyperlipidemia   . Hypertension   . Low back pain    Discharge Diagnoses:   Active Problems:   Osteoarthritis of right knee   S/P knee replacement  Estimated body mass index is 31.81 kg/m as calculated from the following:   Height as of this encounter: '6\' 3"'$  (1.905 m).   Weight as of this encounter: 115.4 kg (254 lb 8 oz).  Procedure:  Procedure(s) (LRB): RIGHT TOTAL KNEE ARTHROPLASTY (Right)   Consults: None  HPI: Paul Morrison presented to [facility] after failing conservative management of his right knee OA.  He elected to proceed with right TKA. Laboratory Data: Admission on 11/24/2016  Component Date Value Ref Range Status  . WBC 11/25/2016 19.2* 4.0 - 10.5 K/uL Final  . RBC 11/25/2016 4.59  4.22 - 5.81 MIL/uL Final  . Hemoglobin 11/25/2016 14.3  13.0 - 17.0 g/dL Final  . HCT 11/25/2016 41.3  39.0 - 52.0 % Final  . MCV 11/25/2016 90.0  78.0 - 100.0 fL Final  . MCH 11/25/2016 31.2  26.0 - 34.0 pg Final  . MCHC 11/25/2016 34.6  30.0 - 36.0 g/dL Final  . RDW 11/25/2016 12.6  11.5 - 15.5 % Final  . Platelets 11/25/2016 183  150 - 400 K/uL Final  . Sodium 11/25/2016 134* 135 - 145 mmol/L Final  . Potassium 11/25/2016 4.1  3.5 - 5.1 mmol/L Final  . Chloride 11/25/2016 102  101 - 111 mmol/L Final  . CO2 11/25/2016 25  22 - 32 mmol/L Final  . Glucose, Bld 11/25/2016 140* 65 - 99 mg/dL Final  . BUN 11/25/2016 15  6 - 20 mg/dL Final  . Creatinine, Ser 11/25/2016 0.93  0.61 - 1.24 mg/dL Final  . Calcium 11/25/2016 8.7* 8.9 - 10.3 mg/dL Final  . GFR calc non Af Amer 11/25/2016 >60  >60 mL/min Final  . GFR calc Af Amer 11/25/2016 >60  >60 mL/min Final   Comment: (NOTE) The eGFR has been calculated using the CKD EPI equation. This calculation has not been  validated in all clinical situations. eGFR's persistently <60 mL/min signify possible Chronic Kidney Disease.   . Anion gap 11/25/2016 7  5 - 15 Final  . WBC 11/26/2016 15.5* 4.0 - 10.5 K/uL Final  . RBC 11/26/2016 4.30  4.22 - 5.81 MIL/uL Final  . Hemoglobin 11/26/2016 13.4  13.0 - 17.0 g/dL Final  . HCT 11/26/2016 39.3  39.0 - 52.0 % Final  . MCV 11/26/2016 91.4  78.0 - 100.0 fL Final  . MCH 11/26/2016 31.2  26.0 - 34.0 pg Final  . MCHC 11/26/2016 34.1  30.0 - 36.0 g/dL Final  . RDW 11/26/2016 12.8  11.5 - 15.5 % Final  . Platelets 11/26/2016 174  150 - 400 K/uL Final  Hospital Outpatient Visit on 11/16/2016  Component Date Value Ref Range Status  . aPTT 11/16/2016 28  24 - 36 seconds Final  . Sodium 11/16/2016 138  135 - 145 mmol/L Final  . Potassium 11/16/2016 4.5  3.5 - 5.1 mmol/L Final  . Chloride 11/16/2016 103  101 - 111 mmol/L Final  . CO2 11/16/2016 27  22 - 32 mmol/L Final  . Glucose, Bld 11/16/2016 123* 65 - 99 mg/dL Final  . BUN 11/16/2016 13  6 - 20 mg/dL Final  .  Creatinine, Ser 11/16/2016 1.07  0.61 - 1.24 mg/dL Final  . Calcium 11/16/2016 9.4  8.9 - 10.3 mg/dL Final  . GFR calc non Af Amer 11/16/2016 >60  >60 mL/min Final  . GFR calc Af Amer 11/16/2016 >60  >60 mL/min Final   Comment: (NOTE) The eGFR has been calculated using the CKD EPI equation. This calculation has not been validated in all clinical situations. eGFR's persistently <60 mL/min signify possible Chronic Kidney Disease.   . Anion gap 11/16/2016 8  5 - 15 Final  . WBC 11/16/2016 6.2  4.0 - 10.5 K/uL Final  . RBC 11/16/2016 5.38  4.22 - 5.81 MIL/uL Final  . Hemoglobin 11/16/2016 17.1* 13.0 - 17.0 g/dL Final  . HCT 11/16/2016 48.8  39.0 - 52.0 % Final  . MCV 11/16/2016 90.7  78.0 - 100.0 fL Final  . MCH 11/16/2016 31.8  26.0 - 34.0 pg Final  . MCHC 11/16/2016 35.0  30.0 - 36.0 g/dL Final  . RDW 11/16/2016 12.5  11.5 - 15.5 % Final  . Platelets 11/16/2016 164  150 - 400 K/uL Final  . Prothrombin  Time 11/16/2016 12.7  11.4 - 15.2 seconds Final  . INR 11/16/2016 0.95   Final  . Color, Urine 11/16/2016 YELLOW  YELLOW Final  . APPearance 11/16/2016 CLEAR  CLEAR Final  . Specific Gravity, Urine 11/16/2016 1.013  1.005 - 1.030 Final  . pH 11/16/2016 6.0  5.0 - 8.0 Final  . Glucose, UA 11/16/2016 NEGATIVE  NEGATIVE mg/dL Final  . Hgb urine dipstick 11/16/2016 NEGATIVE  NEGATIVE Final  . Bilirubin Urine 11/16/2016 NEGATIVE  NEGATIVE Final  . Ketones, ur 11/16/2016 NEGATIVE  NEGATIVE mg/dL Final  . Protein, ur 11/16/2016 100* NEGATIVE mg/dL Final  . Nitrite 11/16/2016 NEGATIVE  NEGATIVE Final  . Leukocytes, UA 11/16/2016 NEGATIVE  NEGATIVE Final  . RBC / HPF 11/16/2016 0-5  0 - 5 RBC/hpf Final  . WBC, UA 11/16/2016 0-5  0 - 5 WBC/hpf Final  . Bacteria, UA 11/16/2016 NONE SEEN  NONE SEEN Final  . Squamous Epithelial / LPF 11/16/2016 NONE SEEN  NONE SEEN Final  . Mucous 11/16/2016 PRESENT   Final  . ABO/RH(D) 11/16/2016 AB POS   Final  . Antibody Screen 11/16/2016 NEG   Final  . Sample Expiration 11/16/2016 11/27/2016   Final  . Extend sample reason 11/16/2016 NO TRANSFUSIONS OR PREGNANCY IN THE PAST 3 MONTHS   Final  . MRSA, PCR 11/16/2016 NEGATIVE  NEGATIVE Final  . Staphylococcus aureus 11/16/2016 NEGATIVE  NEGATIVE Final   Comment:        The Xpert SA Assay (FDA approved for NASAL specimens in patients over 25 years of age), is one component of a comprehensive surveillance program.  Test performance has been validated by [facility] for patients greater than or equal to 89 year old. It is not intended to diagnose infection nor to guide or monitor treatment.   . ABO/RH(D) 11/16/2016 AB POS   Final     X-Rays:No results found.  EKG: Orders placed or performed in visit on 08/30/16  . EKG 12-Lead     Hospital Course: Paul Morrison is a 61 y.o. who was admitted to Hospital. They were brought to the operating room on 11/24/2016 and underwent Procedure(s): RIGHT TOTAL  KNEE ARTHROPLASTY.  Patient tolerated the procedure well and was later transferred to the recovery room and then to the orthopaedic floor for postoperative care.  They were given PO and IV analgesics for pain control  following their surgery.  They were given 24 hours of postoperative antibiotics of  Anti-infectives    Start     Dose/Rate Route Frequency Ordered Stop   11/24/16 1830  ceFAZolin (ANCEF) IVPB 2g/100 mL premix     2 g 200 mL/hr over 30 Minutes Intravenous Every 6 hours 11/24/16 1551 11/25/16 0153   11/24/16 1012  ceFAZolin (ANCEF) IVPB 2g/100 mL premix     2 g 200 mL/hr over 30 Minutes Intravenous On call to O.R. 11/24/16 1012 11/24/16 1230     and started on DVT prophylaxis in the form of Aspirin.   PT and OT were ordered for total joint protocol.  Discharge planning consulted to help with postop disposition and equipment needs.  Patient had a good night on the evening of surgery.  They started to get up OOB with therapy on day one. Hemovac drain was pulled without difficulty.  Continued to work with therapy into day two.  Dressing was changed on day two and the incision was c/d/i.  By day two, the patient had progressed with therapy and meeting their goals.  Incision was healing well.  Patient was seen in rounds and was ready to go home.   Diet: Regular diet Activity:WBAT Follow-up:in 2 weeks Disposition - Home Discharged Condition: good   Discharge Instructions    Call MD / Call 911    Complete by:  As directed    If you experience chest pain or shortness of breath, CALL 911 and be transported to the hospital emergency room.  If you develope a fever above 101 F, pus (white drainage) or increased drainage or redness at the wound, or calf pain, call your surgeon's office.   Call MD / Call 911    Complete by:  As directed    If you experience chest pain or shortness of breath, CALL 911 and be transported to the hospital emergency room.  If you develope a fever above 101 F, pus  (white drainage) or increased drainage or redness at the wound, or calf pain, call your surgeon's office.   Constipation Prevention    Complete by:  As directed    Drink plenty of fluids.  Prune juice may be helpful.  You may use a stool softener, such as Colace (over the counter) 100 mg twice a day.  Use MiraLax (over the counter) for constipation as needed.   Constipation Prevention    Complete by:  As directed    Drink plenty of fluids.  Prune juice may be helpful.  You may use a stool softener, such as Colace (over the counter) 100 mg twice a day.  Use MiraLax (over the counter) for constipation as needed.   Diet - low sodium heart healthy    Complete by:  As directed    Diet - low sodium heart healthy    Complete by:  As directed    Discharge instructions    Complete by:  As directed    INSTRUCTIONS AFTER JOINT REPLACEMENT   Remove items at home which could result in a fall. This includes throw rugs or furniture in walking pathways ICE to the affected joint every three hours while awake for 30 minutes at a time, for at least the first 3-5 days, and then as needed for pain and swelling.  Continue to use ice for pain and swelling. You may notice swelling that will progress down to the foot and ankle.  This is normal after surgery.  Elevate your leg when  you are not up walking on it.   Continue to use the breathing machine you got in the hospital (incentive spirometer) which will help keep your temperature down.  It is common for your temperature to cycle up and down following surgery, especially at night when you are not up moving around and exerting yourself.  The breathing machine keeps your lungs expanded and your temperature down.   DIET:  As you were doing prior to hospitalization, we recommend a well-balanced diet.  DRESSING / WOUND CARE / SHOWERING  Keep the surgical dressing until follow up.  The dressing is water proof, so you can shower without any extra covering.  IF THE  DRESSING FALLS OFF or the wound gets wet inside, change the dressing with sterile gauze.  Please use good hand washing techniques before changing the dressing.  Do not use any lotions or creams on the incision until instructed by your surgeon.    ACTIVITY  Increase activity slowly as tolerated, but follow the weight bearing instructions below.   No driving for 6 weeks or until further direction given by your physician.  You cannot drive while taking narcotics.  No lifting or carrying greater than 10 lbs. until further directed by your surgeon. Avoid periods of inactivity such as sitting longer than an hour when not asleep. This helps prevent blood clots.  You may return to work once you are authorized by your doctor.     WEIGHT BEARING   Weight bearing as tolerated with assist device (walker, cane, etc) as directed, use it as long as suggested by your surgeon or therapist, typically at least 4-6 weeks.   EXERCISES  Results after joint replacement surgery are often greatly improved when you follow the exercise, range of motion and muscle strengthening exercises prescribed by your doctor. Safety measures are also important to protect the joint from further injury. Any time any of these exercises cause you to have increased pain or swelling, decrease what you are doing until you are comfortable again and then slowly increase them. If you have problems or questions, call your caregiver or physical therapist for advice.   Rehabilitation is important following a joint replacement. After just a few days of immobilization, the muscles of the leg can become weakened and shrink (atrophy).  These exercises are designed to build up the tone and strength of the thigh and leg muscles and to improve motion. Often times heat used for twenty to thirty minutes before working out will loosen up your tissues and help with improving the range of motion but do not use heat for the first two weeks following surgery  (sometimes heat can increase post-operative swelling).   These exercises can be done on a training (exercise) mat, on the floor, on a table or on a bed. Use whatever works the best and is most comfortable for you.    Use music or television while you are exercising so that the exercises are a pleasant break in your day. This will make your life better with the exercises acting as a break in your routine that you can look forward to.   Perform all exercises about fifteen times, three times per day or as directed.  You should exercise both the operative leg and the other leg as well.   Exercises include:   Quad Sets - Tighten up the muscle on the front of the thigh (Quad) and hold for 5-10 seconds.   Straight Leg Raises - With your knee straight (if  you were given a brace, keep it on), lift the leg to 60 degrees, hold for 3 seconds, and slowly lower the leg.  Perform this exercise against resistance later as your leg gets stronger.  Leg Slides: Lying on your back, slowly slide your foot toward your buttocks, bending your knee up off the floor (only go as far as is comfortable). Then slowly slide your foot back down until your leg is flat on the floor again.  Angel Wings: Lying on your back spread your legs to the side as far apart as you can without causing discomfort.  Hamstring Strength:  Lying on your back, push your heel against the floor with your leg straight by tightening up the muscles of your buttocks.  Repeat, but this time bend your knee to a comfortable angle, and push your heel against the floor.  You may put a pillow under the heel to make it more comfortable if necessary.   A rehabilitation program following joint replacement surgery can speed recovery and prevent re-injury in the future due to weakened muscles. Contact your doctor or a physical therapist for more information on knee rehabilitation.    CONSTIPATION  Constipation is defined medically as fewer than three stools per week  and severe constipation as less than one stool per week.  Even if you have a regular bowel pattern at home, your normal regimen is likely to be disrupted due to multiple reasons following surgery.  Combination of anesthesia, postoperative narcotics, change in appetite and fluid intake all can affect your bowels.   YOU MUST use at least one of the following options; they are listed in order of increasing strength to get the job done.  They are all available over the counter, and you may need to use some, POSSIBLY even all of these options:    Drink plenty of fluids (prune juice may be helpful) and high fiber foods Colace 100 mg by mouth twice a day  Senokot for constipation as directed and as needed Dulcolax (bisacodyl), take with full glass of water  Miralax (polyethylene glycol) once or twice a day as needed.  If you have tried all these things and are unable to have a bowel movement in the first 3-4 days after surgery call either your surgeon or your primary doctor.    If you experience loose stools or diarrhea, hold the medications until you stool forms back up.  If your symptoms do not get better within 1 week or if they get worse, check with your doctor.  If you experience "the worst abdominal pain ever" or develop nausea or vomiting, please contact the office immediately for further recommendations for treatment.   ITCHING:  If you experience itching with your medications, try taking only a single pain pill, or even half a pain pill at a time.  You can also use Benadryl over the counter for itching or also to help with sleep.   TED HOSE STOCKINGS:  Use stockings on both legs until for at least 2 weeks or as directed by physician office. They may be removed at night for sleeping.  MEDICATIONS:  See your medication summary on the "After Visit Summary" that nursing will review with you.  You may have some home medications which will be placed on hold until you complete the course of blood  thinner medication.  It is important for you to complete the blood thinner medication as prescribed.  PRECAUTIONS:  If you experience chest pain or shortness of breath -  call 911 immediately for transfer to the hospital emergency department.   If you develop a fever greater that 101 F, purulent drainage from wound, increased redness or drainage from wound, foul odor from the wound/dressing, or calf pain - CONTACT YOUR SURGEON.                                                   FOLLOW-UP APPOINTMENTS:  If you do not already have a post-op appointment, please call the office for an appointment to be seen by your surgeon.  Guidelines for how soon to be seen are listed in your "After Visit Summary", but are typically between 1-4 weeks after surgery.  OTHER INSTRUCTIONS:   Knee Replacement:  Do not place pillow under knee, focus on keeping the knee straight while resting. CPM instructions: 0-90 degrees, 2 hours in the morning, 2 hours in the afternoon, and 2 hours in the evening. Place foam block, curve side up under heel at all times except when in CPM or when walking.  DO NOT modify, tear, cut, or change the foam block in any way.  MAKE SURE YOU:  Understand these instructions.  Get help right away if you are not doing well or get worse.    Thank you for letting us be a part of your medical care team.  It is a privilege we respect greatly.  We hope these instructions will help you stay on track for a fast and full recovery!   Increase activity slowly as tolerated    Complete by:  As directed    Increase activity slowly as tolerated    Complete by:  As directed      Allergies as of 11/26/2016      Reactions   Cephalexin Other (See Comments)   Stomach cramping    Celecoxib Rash      Medication List    STOP taking these medications   HYDROcodone-acetaminophen 5-325 MG tablet Commonly known as:  NORCO/VICODIN   naproxen sodium 220 MG tablet Commonly known as:  ANAPROX     TAKE these  medications   ALPRAZolam 0.5 MG tablet Commonly known as:  XANAX Take 1 tablet (0.5 mg total) by mouth 3 (three) times daily as needed for anxiety. What changed:  Another medication with the same name was added. Make sure you understand how and when to take each.   ALPRAZolam 0.5 MG tablet Commonly known as:  XANAX Take 1 tablet (0.5 mg total) by mouth 3 (three) times daily as needed for anxiety. What changed:  You were already taking a medication with the same name, and this prescription was added. Make sure you understand how and when to take each.   aspirin EC 325 MG tablet Take 1 tablet (325 mg total) by mouth 2 (two) times daily. What changed:  when to take this  reasons to take this   COSAMIN DS PO Take 1 capsule by mouth daily as needed (pain).   HYDROmorphone 2 MG tablet Commonly known as:  DILAUDID Take 1-2 tablets (2-4 mg total) by mouth every 4 (four) hours as needed for severe pain.   losartan-hydrochlorothiazide 50-12.5 MG tablet Commonly known as:  HYZAAR TAKE ONE TABLET EACH DAY   methocarbamol 500 MG tablet Commonly known as:  ROBAXIN Take 1 tablet (500 mg total) by mouth every 8 (eight) hours as  needed for muscle spasms.   sildenafil 20 MG tablet Commonly known as:  REVATIO Take 2 to 5 tablets one hour prior to sexual activity      Follow-up Information    Cynda Familia, MD. Schedule an appointment as soon as possible for a visit in 2 week(s).   Specialty:  Orthopedic Surgery Contact information: [address] [place] [address] [phone]           Signed: Geralynn Rile, MD Orthopaedic Surgery 11/26/2016, 8:29 AM

## 2016-11-26 NOTE — Care Management Note (Signed)
Case Management Note  Patient Details  Name: Paul Morrison MRN: [id number] Date of Birth: Jun 15, 1956  Subjective/Objective:                   RTKA  Action/Plan: Discharge planning Expected Discharge Date:  11/26/16               Expected Discharge Plan:  [place]  In-House Referral:     Discharge planning Services  CM Consult  Post Acute Care Choice:  Home Health Choice offered to:  Patient  DME Arranged:  N/A DME Agency:  NA  HH Arranged:  PT [place] Agency:  [facility] (formerly [facility])  Status of Service:  Completed, signed off  If discussed at [employer], dates discussed:    Additional Comments: CM notes pt presurgically arranged home health with [facility]. This CM notified [facility] rep, Dona of discharge to schedule start of care.  Pt has all DME needed at home. No other CM needs were communicated. Dellie Catholic, RN 11/26/2016, 9:06 AM

## 2016-11-26 NOTE — Progress Notes (Signed)
Occupational Therapy Treatment Patient Details Name: Paul Morrison MRN: [id number] DOB: 02/15/1956 Today's Date: 11/26/2016    History of present illness s/p RTKA , with history of lumbar fusion    OT comments  All education completed this session. No further OT needs at this time  Follow Up Recommendations  No OT follow up    Equipment Recommendations  None recommended by OT (has 3:1)    Recommendations for Other Services      Precautions / Restrictions Precautions Precautions: Knee Restrictions Weight Bearing Restrictions: No       Mobility Bed Mobility Overal bed mobility: Modified Independent             General bed mobility comments: HOB raised; pt used LLE to assist R into bed.  Educated on sheet/towel as option too to decrease back stress  Transfers   Equipment used: Rolling walker (2 wheeled)   Sit to Stand: Supervision              Balance                                           ADL either performed or assessed with clinical judgement   ADL       Grooming: Wash/dry Radiographer, therapeutic: Supervision/safety;Ambulation;BSC;RW       Tub/ Shower Transfer: Walk-in shower;Supervision/safety;Ambulation;3 in 1;Rolling walker     General ADL Comments: pt demonstrates good safety with the above activities. Wife will assist with adls as needed.  Educated on 3:1 in small shower stall, facing out and attaching curtain liner to door frame if needed to keep leg outside of shower on stool.     Vision       Perception     Praxis      Cognition Arousal/Alertness: Awake/alert Behavior During Therapy: WFL for tasks assessed/performed Overall Cognitive Status: Within Functional Limits for tasks assessed                                          Exercises     Shoulder Instructions       General Comments      Pertinent Vitals/ Pain       Pain  Score: 2  Pain Location: R knee Pain Descriptors / Indicators: Sore Pain Intervention(s): Limited activity within patient's tolerance;Monitored during session;Premedicated before session;Repositioned;Ice applied  Home Living                                          Prior Functioning/Environment              Frequency           Progress Toward Goals  OT Goals(current goals can now be found in the care plan section)  Progress towards OT goals: Progressing toward goals (no further OT is needed at this time)  Acute Rehab OT Goals Patient Stated Goal: be able to bike and be active again   Plan      Co-evaluation  End of Session    OT Visit Diagnosis: Unsteadiness on feet (R26.81)   Activity Tolerance Patient tolerated treatment well   Patient Left in bed;with call bell/phone within reach   Nurse Communication  (finished with OT)        Time: 0630-1601 (MD came during session) OT Time Calculation (min): 23 min  Charges: OT General Charges $OT Visit: 1 Procedure OT Treatments $Self Care/Home Management : 8-22 mins  [name], OTR/L [phone] 11/26/2016   [place] 11/26/2016, 9:30 AM

## 2016-11-26 NOTE — Progress Notes (Signed)
CSW received consult for SNF placement, note PT evaluation recommended HH. CSW reviewed notes indicating that patient plans to return home at discharge.   No further CSW needs identified - CSW signing off.   Raynaldo Opitz, LCSW Clinical Social Worker cell #: [phone] (weekend coverage)

## 2016-11-26 NOTE — Progress Notes (Signed)
Physical Therapy Treatment Patient Details Name: Paul Morrison MRN: [id number] DOB: Jan 26, 1956 Today's Date: 11/26/2016    History of Present Illness s/p RTKA , with history of lumbar fusion     PT Comments    POD # 2 pt eager to D/C to home Assisted with amb a greater distance, performed one step and all supine TKR TE's following HEP handout.  Instructed on proper tech, freq as well as use of ICE. Pt ready for D/C to home.  Follow Up Recommendations  Outpatient PT     Equipment Recommendations  None recommended by PT    Recommendations for Other Services       Precautions / Restrictions Precautions Precautions: Knee Restrictions Weight Bearing Restrictions: No Other Position/Activity Restrictions: WBAT    Mobility  Bed Mobility Overal bed mobility: Modified Independent             General bed mobility comments: OOB in recliner  Transfers Overall transfer level: Needs assistance Equipment used: Rolling walker (2 wheeled) Transfers: Sit to/from Stand Sit to Stand: Supervision Stand pivot transfers: Supervision       General transfer comment: <25% VC for technique, and safety with RW. Tolerated well today!  Ambulation/Gait Ambulation/Gait assistance: Supervision Ambulation Distance (Feet): 75 Feet Assistive device: Rolling walker (2 wheeled) Gait Pattern/deviations: Step-to pattern;Step-through pattern Gait velocity: WFL   General Gait Details: tolerated an increased distance   Stairs Stairs: Yes   Stair Management: No rails;Step to pattern;Forwards Number of Stairs: 1 General stair comments: 25% VC's on proper tech and walker placement.  Performed well.  Wheelchair Mobility    Modified Rankin (Stroke Patients Only)       Balance                                            Cognition Arousal/Alertness: Awake/alert Behavior During Therapy: WFL for tasks assessed/performed Overall Cognitive Status: Within Functional  Limits for tasks assessed                                        Exercises   Total Knee Replacement TE's 10 reps B LE ankle pumps 10 reps towel squeezes 10 reps knee presses 10 reps heel slides  10 reps SAQ's 10 reps SLR's 10 reps ABD Followed by ICE     General Comments        Pertinent Vitals/Pain Pain Assessment: 0-10 Pain Score: 4  Pain Location: R knee Pain Descriptors / Indicators: Sore;Operative site guarding Pain Intervention(s): Monitored during session;Repositioned;Ice applied    Home Living                      Prior Function            PT Goals (current goals can now be found in the care plan section) Acute Rehab PT Goals Patient Stated Goal: be able to bike and be active again  Progress towards PT goals: Progressing toward goals    Frequency    7X/week      PT Plan Current plan remains appropriate    Co-evaluation             End of Session Equipment Utilized During Treatment: Gait belt Activity Tolerance: Patient tolerated treatment well Patient left: in chair;with call bell/phone within reach  Nurse Communication: Mobility status (pt ready for D/C to home) PT Visit Diagnosis: Unsteadiness on feet (R26.81)     Time: 6147-0929 PT Time Calculation (min) (ACUTE ONLY): 30 min  Charges:  $Gait Training: 8-22 mins $Therapeutic Exercise: 8-22 mins                    G Codes:       Rica Koyanagi  PTA [facility]  Acute  Rehab Pager      [phone]

## 2016-11-26 NOTE — Progress Notes (Signed)
   Subjective:  Patient reports pain as mild.  Doing well.  No complaints this am of n/v or SOB, CP.  Ready to go home.  Objective:   VITALS:   Vitals:   11/25/16 1302 11/25/16 2144 11/26/16 0538 11/26/16 0653  BP: (!) 158/84 136/83 (!) 186/94 135/69  Pulse: 71 76 77 79  Resp: 16 16 18    Temp: 97.7 F (36.5 C) 97.8 F (36.6 C) 97.7 F (36.5 C)   TempSrc: Oral Oral Oral   SpO2: 96% 97% 100%   Weight:      Height:        Neurovascular intact Sensation intact distally Intact pulses distally Incision: dressing C/D/I Compartment soft   Lab Results  Component Value Date   WBC 15.5 (H) 11/26/2016   HGB 13.4 11/26/2016   HCT 39.3 11/26/2016   MCV 91.4 11/26/2016   PLT 174 11/26/2016   BMET    Component Value Date/Time   NA 134 (L) 11/25/2016 0453   K 4.1 11/25/2016 0453   CL 102 11/25/2016 0453   CO2 25 11/25/2016 0453   GLUCOSE 140 (H) 11/25/2016 0453   BUN 15 11/25/2016 0453   CREATININE 0.93 11/25/2016 0453   CALCIUM 8.7 (L) 11/25/2016 0453   GFRNONAA >60 11/25/2016 0453   GFRAA >60 11/25/2016 0453     Assessment/Plan: 2 Days Post-Op   Active Problems:   Osteoarthritis of right knee   S/P knee replacement   Advance diet Up with PT/OT Dc home today SCD, TED hose and asa for dvt ppx Follow up 2 weeks   Nicholes Stairs 11/26/2016, 8:31 AM   Geralynn Rile, MD [phone]

## 2016-12-20 ENCOUNTER — Encounter: Payer: Self-pay | Admitting: Family Medicine

## 2016-12-26 ENCOUNTER — Encounter: Payer: Self-pay | Admitting: Family Medicine

## 2016-12-26 ENCOUNTER — Ambulatory Visit (INDEPENDENT_AMBULATORY_CARE_PROVIDER_SITE_OTHER): Payer: BLUE CROSS/BLUE SHIELD | Admitting: Family Medicine

## 2016-12-26 VITALS — BP 128/84 | HR 94 | Temp 97.6°F | Wt 256.6 lb

## 2016-12-26 DIAGNOSIS — I10 Essential (primary) hypertension: Secondary | ICD-10-CM

## 2016-12-26 DIAGNOSIS — Z23 Encounter for immunization: Secondary | ICD-10-CM

## 2016-12-26 NOTE — Patient Instructions (Signed)
WE NOW OFFER   Paul Morrison's FAST TRACK!!!  SAME DAY Appointments for ACUTE CARE  Such as: Sprains, Injuries, cuts, abrasions, rashes, muscle pain, joint pain, back pain Colds, flu, sore throats, headache, allergies, cough, fever  Ear pain, sinus and eye infections Abdominal pain, nausea, vomiting, diarrhea, upset stomach Animal/insect bites  3 Easy Ways to Schedule: Walk-In Scheduling Call in scheduling Mychart Sign-up: [email or web address]         

## 2016-12-26 NOTE — Progress Notes (Signed)
Subjective:     Patient ID: Paul Morrison, male   DOB: [birth year], 61 y.o.   MRN: [id number]  HPI Patient seen basically requesting tetanus booster. He has grandson which is his first grandchild which was born a few weeks ago. He was 7 weeks premature. Patient's last tetanus on record was 2009. He is specifically requesting Tdap booster.  Patient had right total knee replacement a month ago and has done extremely well since then. He is progressing with physical therapy.  Past Medical History:  Diagnosis Date  . Hyperlipidemia   . Hypertension   . Low back pain    Past Surgical History:  Procedure Laterality Date  . HERNIA REPAIR    . INJECTION KNEE     both knees  . keratatomy    . knee arthroscopy    . LUMBAR FUSION    . TOTAL KNEE ARTHROPLASTY Right 11/24/2016   Procedure: RIGHT TOTAL KNEE ARTHROPLASTY;  Surgeon: [name], MD;  Location: [facility];  Service: Orthopedics;  Laterality: Right;  Marland Kitchen VASECTOMY      reports that he has quit smoking. He has never used smokeless tobacco. He reports that he drinks alcohol. He reports that he does not use drugs. family history includes Asthma in his mother; Hyperlipidemia in his mother; Hypertension in his mother. Allergies  Allergen Reactions  . Cephalexin Other (See Comments)    Stomach cramping   . Celecoxib Rash     Review of Systems  Constitutional: Negative for chills and fever.       Objective:   Physical Exam  Constitutional: He appears well-developed and well-nourished.  Cardiovascular: Normal rate and regular rhythm.   Pulmonary/Chest: Effort normal and breath sounds normal. No respiratory distress. He has no wheezes. He has no rales.  Musculoskeletal:  Right knee appears be healing well. No erythema. Mild swelling as expected.       Assessment:     Patient requesting tetanus booster-pertussis booster  Hypertension improved by repeat reading after rest    Plan:     -Tdap given -Continue with yearly  follow-up for complete physical  Eulas Post MD [facility] at [facility]

## 2017-02-19 ENCOUNTER — Telehealth: Payer: BLUE CROSS/BLUE SHIELD | Admitting: Nurse Practitioner

## 2017-02-19 DIAGNOSIS — H109 Unspecified conjunctivitis: Secondary | ICD-10-CM

## 2017-02-19 MED ORDER — POLYMYXIN B-TRIMETHOPRIM 10000-0.1 UNIT/ML-% OP SOLN: 1.0000 [drp] | OPHTHALMIC | 0 refills | Status: DC

## 2017-02-19 NOTE — Progress Notes (Signed)
Thank you for the details you put in the comment boxes. Those details really help [place] take better care of you.   We are sorry that you are not feeling well.  Here is how we plan to help!  Based on what you have shared with me it looks like you have conjunctivitis.  Conjunctivitis is a common inflammatory or infectious condition of the eye that is often referred to as "pink eye".  In most cases it is contagious (viral or bacterial). However, not all conjunctivitis requires antibiotics (ex. Allergic).  We have made appropriate suggestions for you based upon your presentation.  I have prescribed Polytrim Ophthalmic drops 1 drop every 4 hours  times 5 days  Pink eye can be highly contagious.  It is typically spread through direct contact with secretions, or contaminated objects or surfaces that one may have touched.  Strict handwashing is suggested with soap and water is urged.  If not available, use alcohol based had sanitizer.  Avoid unnecessary touching of the eye.  If you wear contact lenses, you will need to refrain from wearing them until you see no white discharge from the eye for at least 24 hours after being on medication.  You should see symptom improvement in 1-2 days after starting the medication regimen.  Call us if symptoms are not improved in 1-2 days.  Home Care:  Wash your hands often!  Do not wear your contacts until you complete your treatment plan.  Avoid sharing towels, bed linen, personal items with a person who has pink eye.  See attention for anyone in your home with similar symptoms.  Get Help Right Away If:  Your symptoms do not improve.  You develop blurred or loss of vision.  Your symptoms worsen (increased discharge, pain or redness)  Your e-visit answers were reviewed by a board certified advanced clinical practitioner to complete your personal care plan.  Depending on the condition, your plan could have included both over the counter or prescription  medications.  If there is a problem please reply  once you have received a response from your provider.  Your safety is important to [place].  If you have drug allergies check your prescription carefully.    You can use MyChart to ask questions about today's visit, request a non-urgent call back, or ask for a work or school excuse for 24 hours related to this e-Visit. If it has been greater than 24 hours you will need to follow up with your provider, or enter a new e-Visit to address those concerns.   You will get an e-mail in the next two days asking about your experience.  I hope that your e-visit has been valuable and will speed your recovery. Thank you for using e-visits.

## 2017-02-28 ENCOUNTER — Other Ambulatory Visit: Payer: Self-pay | Admitting: Family Medicine

## 2017-03-21 ENCOUNTER — Encounter: Payer: Self-pay | Admitting: Family Medicine

## 2017-03-21 DIAGNOSIS — R5383 Other fatigue: Secondary | ICD-10-CM

## 2017-03-21 DIAGNOSIS — E291 Testicular hypofunction: Secondary | ICD-10-CM

## 2017-03-23 ENCOUNTER — Other Ambulatory Visit (INDEPENDENT_AMBULATORY_CARE_PROVIDER_SITE_OTHER): Payer: Self-pay

## 2017-03-23 ENCOUNTER — Other Ambulatory Visit: Payer: Self-pay | Admitting: Family Medicine

## 2017-03-23 DIAGNOSIS — R5383 Other fatigue: Secondary | ICD-10-CM

## 2017-03-23 DIAGNOSIS — E291 Testicular hypofunction: Secondary | ICD-10-CM

## 2017-03-23 LAB — PSA: PSA: 1.12 ng/mL (ref 0.10–4.00)

## 2017-03-23 LAB — CBC WITH DIFFERENTIAL/PLATELET
BASOS ABS: 0 10*3/uL (ref 0.0–0.1)
Basophils Relative: 0.8 % (ref 0.0–3.0)
EOS ABS: 0.2 10*3/uL (ref 0.0–0.7)
Eosinophils Relative: 3.8 % (ref 0.0–5.0)
HCT: 48.3 % (ref 39.0–52.0)
Hemoglobin: 16.1 g/dL (ref 13.0–17.0)
Lymphocytes Relative: 26.3 % (ref 12.0–46.0)
Lymphs Abs: 1.5 10*3/uL (ref 0.7–4.0)
MCHC: 33.4 g/dL (ref 30.0–36.0)
MCV: 92.4 fl (ref 78.0–100.0)
MONO ABS: 0.6 10*3/uL (ref 0.1–1.0)
MONOS PCT: 10.7 % (ref 3.0–12.0)
Neutro Abs: 3.3 10*3/uL (ref 1.4–7.7)
Neutrophils Relative %: 58.4 % (ref 43.0–77.0)
Platelets: 202 10*3/uL (ref 150.0–400.0)
RBC: 5.23 Mil/uL (ref 4.22–5.81)
RDW: 14.5 % (ref 11.5–15.5)
WBC: 5.7 10*3/uL (ref 4.0–10.5)

## 2017-03-23 LAB — TSH: TSH: 2.65 u[IU]/mL (ref 0.35–4.50)

## 2017-03-23 NOTE — Progress Notes (Signed)
Pt just needs a total testosterone level- not free or SHBG

## 2017-03-26 LAB — TESTOSTERONE, FREE, TOTAL, SHBG
Sex Hormone Binding: 41.1 nmol/L (ref 19.3–76.4)
Testosterone, Free: 10.4 pg/mL (ref 6.6–18.1)
Testosterone: 338 ng/dL (ref 264–916)

## 2017-03-27 NOTE — Telephone Encounter (Signed)
Pt is returning rachel call for blood work results

## 2017-04-19 ENCOUNTER — Encounter: Payer: Self-pay | Admitting: Family Medicine

## 2017-05-22 ENCOUNTER — Encounter: Payer: Self-pay | Admitting: Family Medicine

## 2017-05-23 MED ORDER — LOSARTAN POTASSIUM-HCTZ 50-12.5 MG PO TABS: ORAL_TABLET | ORAL | 1 refills | Status: DC

## 2017-05-23 MED ORDER — SILDENAFIL CITRATE 20 MG PO TABS: ORAL_TABLET | ORAL | 6 refills | Status: DC

## 2017-05-25 ENCOUNTER — Ambulatory Visit (INDEPENDENT_AMBULATORY_CARE_PROVIDER_SITE_OTHER): Payer: BLUE CROSS/BLUE SHIELD | Admitting: Family Medicine

## 2017-05-25 ENCOUNTER — Encounter: Payer: Self-pay | Admitting: *Deleted

## 2017-05-25 ENCOUNTER — Encounter: Payer: Self-pay | Admitting: Family Medicine

## 2017-05-25 VITALS — BP 132/84 | HR 50 | Temp 97.9°F | Ht 75.0 in | Wt 258.5 lb

## 2017-05-25 DIAGNOSIS — Z23 Encounter for immunization: Secondary | ICD-10-CM

## 2017-05-25 DIAGNOSIS — G8929 Other chronic pain: Secondary | ICD-10-CM | POA: Diagnosis not present

## 2017-05-25 DIAGNOSIS — M545 Low back pain, unspecified: Secondary | ICD-10-CM

## 2017-05-25 MED ORDER — HYDROCODONE-ACETAMINOPHEN 5-325 MG PO TABS: 1.0000 | ORAL_TABLET | Freq: Four times a day (QID) | ORAL | 0 refills | Status: DC | PRN

## 2017-05-25 MED ORDER — HYDROCODONE-ACETAMINOPHEN 5-325 MG PO TABS: ORAL_TABLET | ORAL | 0 refills | Status: DC

## 2017-05-25 MED ORDER — ALPRAZOLAM 0.5 MG PO TABS: 0.5000 mg | ORAL_TABLET | Freq: Three times a day (TID) | ORAL | 0 refills | Status: DC | PRN

## 2017-05-25 NOTE — Progress Notes (Signed)
Subjective:     Patient ID: Paul Morrison, male   DOB: March 02, 1956, 61 y.o.   MRN: [id number]  HPI Patient here to discuss chronic pain issues regarding his back. Fusion several years ago and for years has taken intermittent hydrocodone. He states he tried Tylenol and nonsteroidal such as Aleve and tramadol without much relief. He sometimes has severe pain up to 89 OT and and gets good relief with 5 mg hydrocodone. He is a takes about one tablet 5 days per week. He states that when he has flareups with his back pain he has difficulty getting through his work day. He's had prior knee surgery and has flareups with knee pain as well but mostly his back pain is lower lumbar related to the fusion. Exacerbated by certain activities.  Past Medical History:  Diagnosis Date  . Hyperlipidemia   . Hypertension   . Low back pain    Past Surgical History:  Procedure Laterality Date  . HERNIA REPAIR    . INJECTION KNEE     both knees  . keratatomy    . knee arthroscopy    . LUMBAR FUSION    . TOTAL KNEE ARTHROPLASTY Right 11/24/2016   Procedure: RIGHT TOTAL KNEE ARTHROPLASTY;  Surgeon: [name], MD;  Location: [facility];  Service: Orthopedics;  Laterality: Right;  Marland Kitchen VASECTOMY      reports that he has quit smoking. He has never used smokeless tobacco. He reports that he drinks alcohol. He reports that he does not use drugs. family history includes Aneurysm in his unknown relative; Asthma in his mother; Coronary artery disease in his unknown relative; Hyperlipidemia in his mother; Hypertension in his mother. Allergies  Allergen Reactions  . Cephalexin Other (See Comments)    Stomach cramping   . Celecoxib Rash   Indication for chronic opioid: chronic low back pain Medication and dose: Hydrocodone 5 mg every 6 hours prn pain # pills per month: 60 Last UDS date: to be obtained next visit. Pain contract signed (Y/N): 05/25/17 Date narcotic database last reviewed (include red flags): 05/25/17  No  red flags.   Review of Systems  Constitutional: Negative for fatigue.  Eyes: Negative for visual disturbance.  Respiratory: Negative for cough, chest tightness and shortness of breath.   Cardiovascular: Negative for chest pain, palpitations and leg swelling.  Musculoskeletal: Positive for back pain.  Neurological: Negative for dizziness, syncope, weakness, light-headedness and headaches.       Objective:   Physical Exam  Constitutional: He appears well-developed and well-nourished.  Cardiovascular: Normal rate and regular rhythm.  Exam reveals no gallop.   Pulmonary/Chest: Effort normal and breath sounds normal. No respiratory distress. He has no wheezes. He has no rales.  Musculoskeletal: He exhibits no edema.       Assessment:     #1 chronic low back pain with prior history of lumbar fusion    Plan:     -We discussed alternative treatments and he's tried multiple things including tramadol, Tylenol, nonsteroidals, physical therapy without improvement -He tries to avoid regular use of hydrocodone. We explained that if we are to prescribe this would have to do several things including regular follow-up for pain management (every 3 months), yearly drug screening, pain management contract -We refilled hydrocodone 5 mg #60 one every 6 hours as a for severe pain and one refill on or after 06/25/17 -Schedule follow-up in 3 months to reassess  Eulas Post MD [facility] at [facility]

## 2017-08-27 ENCOUNTER — Encounter: Payer: Self-pay | Admitting: Family Medicine

## 2017-08-30 ENCOUNTER — Encounter: Payer: Self-pay | Admitting: Family Medicine

## 2017-09-05 ENCOUNTER — Ambulatory Visit (INDEPENDENT_AMBULATORY_CARE_PROVIDER_SITE_OTHER): Payer: BLUE CROSS/BLUE SHIELD | Admitting: Family Medicine

## 2017-09-05 VITALS — BP 120/80 | HR 109 | Temp 98.0°F | Ht 75.0 in | Wt 260.0 lb

## 2017-09-05 DIAGNOSIS — M5416 Radiculopathy, lumbar region: Secondary | ICD-10-CM | POA: Diagnosis not present

## 2017-09-05 DIAGNOSIS — J3489 Other specified disorders of nose and nasal sinuses: Secondary | ICD-10-CM | POA: Diagnosis not present

## 2017-09-05 DIAGNOSIS — R229 Localized swelling, mass and lump, unspecified: Secondary | ICD-10-CM | POA: Diagnosis not present

## 2017-09-05 MED ORDER — HYDROCODONE-ACETAMINOPHEN 5-325 MG PO TABS: ORAL_TABLET | ORAL | 0 refills | Status: DC

## 2017-09-05 NOTE — Progress Notes (Signed)
Subjective:     Patient ID: Paul Morrison, male   DOB: 1956-01-09, 62 y.o.   MRN: [id number]  HPI Patient is here to discuss multiple items as follows  He states his wife went to some type of integrated health clinic and was diagnosed with "Pseudomonas "intranasally. They apparently have a grandchild with some immune concerns. Patient basically is requesting nasal culture. He does not have any history of MRSA. We explained limitations of culturing things like nasal passages. He does not have any purulent secretions, facial pain, headaches, or fever  Second issue is nonhealing left ear lesion on earlobe for the past 2 or 3 months. This scabs over but does not heal. Denies any injury.  Third issue is he has some chronic back pain. He went to [place] recent had some dry needling which seemed to help. He does have occasional radiculitis symptoms. He's had history of low back surgery in the past. Currently denies any weakness. No urine or stool incontinence. He is asking for a few hydrocodone which he takes infrequently for severe flareups of back pain  Past Medical History:  Diagnosis Date  . Hyperlipidemia   . Hypertension   . Low back pain    Past Surgical History:  Procedure Laterality Date  . HERNIA REPAIR    . INJECTION KNEE     both knees  . keratatomy    . knee arthroscopy    . LUMBAR FUSION    . TOTAL KNEE ARTHROPLASTY Right 11/24/2016   Procedure: RIGHT TOTAL KNEE ARTHROPLASTY;  Surgeon: [name], MD;  Location: [facility];  Service: Orthopedics;  Laterality: Right;  Marland Kitchen VASECTOMY      reports that he has quit smoking. he has never used smokeless tobacco. He reports that he drinks alcohol. He reports that he does not use drugs. family history includes Aneurysm in his unknown relative; Asthma in his mother; Coronary artery disease in his unknown relative; Hyperlipidemia in his mother; Hypertension in his mother. Allergies  Allergen Reactions  . Cephalexin Other  (See Comments)    Stomach cramping   . Celecoxib Rash     Review of Systems  Constitutional: Negative for chills, fatigue and fever.  HENT: Negative for congestion, sinus pressure and sinus pain.   Eyes: Negative for visual disturbance.  Respiratory: Negative for cough, chest tightness and shortness of breath.   Cardiovascular: Negative for chest pain, palpitations and leg swelling.  Musculoskeletal: Positive for back pain.  Neurological: Negative for dizziness, syncope, weakness, light-headedness and headaches.       Objective:   Physical Exam  Constitutional: He appears well-developed and well-nourished.  Cardiovascular: Normal rate and regular rhythm.  Pulmonary/Chest: Effort normal and breath sounds normal. No respiratory distress. He has no wheezes. He has no rales.  Musculoskeletal: He exhibits no edema.  Straight leg raises are negative bilaterally  Neurological:  Full-strength lower extremities. Symmetric reflexes knee and ankle bilaterally  Skin:  Patient has nodular type lesion left earlobe which appears to have somewhat vascular base and scabbed hyperkeratotic/ulcerative center.       Assessment:     #1 skin lesion left earlobe. Nonhealing over couple months. Needs further evaluation.  #2 chronic intermittent low back pain with radiculitis symptoms but nonfocal neuro exam  #3 patient requesting intranasal swab culture. We explained in depth limitations of routine culturing nasal passages and issues such as colonization with bacteria    Plan:     -Referral to skin surgery Center regarding left earlobe lesion -  Continue conservative management of his back pain. -We went ahead and agreed to intranasal swab for culture  Eulas Post MD [facility] at [facility]

## 2017-09-05 NOTE — Patient Instructions (Signed)
We are setting up derm referral.

## 2017-09-10 LAB — WOUND CULTURE
MICRO NUMBER: 90160617
SPECIMEN QUALITY:: ADEQUATE

## 2017-09-12 ENCOUNTER — Other Ambulatory Visit: Payer: Self-pay | Admitting: Family Medicine

## 2017-09-12 MED ORDER — CIPROFLOXACIN HCL 500 MG PO TABS: 500.0000 mg | ORAL_TABLET | Freq: Two times a day (BID) | ORAL | 0 refills | Status: DC

## 2017-11-26 ENCOUNTER — Encounter: Payer: Self-pay | Admitting: Family Medicine

## 2017-11-28 ENCOUNTER — Ambulatory Visit (INDEPENDENT_AMBULATORY_CARE_PROVIDER_SITE_OTHER): Payer: BLUE CROSS/BLUE SHIELD | Admitting: Family Medicine

## 2017-11-28 ENCOUNTER — Encounter: Payer: Self-pay | Admitting: Family Medicine

## 2017-11-28 VITALS — BP 110/70 | HR 108 | Temp 98.2°F | Wt 261.9 lb

## 2017-11-28 DIAGNOSIS — Z86010 Personal history of colonic polyps: Secondary | ICD-10-CM

## 2017-11-28 DIAGNOSIS — Z20818 Contact with and (suspected) exposure to other bacterial communicable diseases: Secondary | ICD-10-CM

## 2017-11-28 MED ORDER — MUPIROCIN 2 % EX OINT: TOPICAL_OINTMENT | CUTANEOUS | 1 refills | Status: DC

## 2017-11-28 NOTE — Patient Instructions (Signed)
FAQs about MRSA (Methicillin-Resistant Staphylococcus aureus)  What is MRSA?  Staphylococcus aureus (pronounced staff-ill-oh-KOK-us AW-ree-us), or "Staph" is a very common germ that about 1 out of every 3 people have on their skin or in their nose. This germ does not cause any problems for most people who have it on their skin. But sometimes it can cause serious infections such as skin or wound infections, pneumonia, or infections of the blood.  Antibiotics are given to kill Staph germs when they cause infections. Some Staph are resistant, meaning they cannot be killed by some antibiotics. "Methicillin-resistant Staphylococcus aureus" or "MRSA" is a type of Staph that is resistant to some of the antibiotics that are often used to treat Staph infections.  Who is most likely to get an MRSA infection?  In the hospital, people who are more likely to get an MRSA infection are people who:   have other health conditions making them sick   have been in the hospital or a nursing home   have been treated with antibiotics.    People who are healthy and who have not been in the hospital or a nursing home can also get MRSA infections. These infections usually involve the skin. More information about this type of MRSA infection, known as "community-associated MRSA" infection, is available from the Centers for Disease Control and Prevention (CDC). http://www.cdc.gov/mrsa  How do I get an MRSA infection?  People who have MRSA germs on their skin or who are infected with MRSA may be able to spread the germ to other people. MRSA can be passed on to bed linens, bed rails, bathroom fixtures, and medical equipment. It can spread to other people on contaminated equipment and on the hands of doctors, nurses, other healthcare providers and visitors.  Can MRSA infections be treated?  Yes, there are antibiotics that can kill MRSA germs. Some patients with MRSA abscesses may need surgery to drain the infection. Your healthcare provider  will determine which treatments are best for you.  What are some of the things that hospitals are doing to prevent MRSA infections?  To prevent MRSA infections, doctors, nurses and other healthcare providers:   Clean their hands with soap and water or an alcohol-based hand rub before and after caring for every patient.   Carefully clean hospital rooms and medical equipment.   Use Contact Precautions when caring for patients with MRSA. Contact Precautions mean:  ? Whenever possible, patients with MRSA will have a single room or will share a room only with someone else who also has MRSA.  ? Healthcare providers will put on gloves and wear a gown over their clothing while taking care of patients with MRSA.  ? Visitors may also be asked to wear a gown and gloves.  ? When leaving the room, hospital providers and visitors remove their gown and gloves and clean their hands.  ? Patients on Contact Precautions are asked to stay in their hospital rooms as much as possible. They should not go to common areas, such as the gift shop or cafeteria. They may go to other areas of the hospital for treatments and tests.   May test some patients to see if they have MRSA on their skin. This test involves rubbing a cotton-tipped swab in the patient's nostrils or on the skin.    What can I do to help prevent MRSA infections?  In the hospital   Make sure that all doctors, nurses, and other healthcare providers clean their hands with soap and   water or an alcohol-based hand rub before and after caring for you.    If you do not see your providers clean their hands, please ask them to do so.  When you go home   If you have wounds or an intravascular device (such as a catheter or dialysis port) make sure that you know how to take care of them.    Can my friends and family get MRSA when they visit me?  The chance of getting MRSA while visiting a person who has MRSA is very low. To decrease the chance of getting MRSA your family and friends  should:   Clean their hands before they enter your room and when they leave.   Ask a healthcare provider if they need to wear protective gowns and gloves when they visit you.    What do I need to do when I go home from the hospital?  To prevent another MRSA infection and to prevent spreading MRSA to others:   Keep taking any antibiotics prescribed by your doctor. Don't take half-doses or stop before you complete your prescribed course.   Clean your hands often, especially before and after changing your wound dressing or bandage.   People who live with you should clean their hands often as well.   Keep any wounds clean and change bandages as instructed until healed.   Avoid sharing personal items such as towels or razors.   Wash and dry your clothes and bed linens in the warmest temperatures recommended on the labels.   Tell your healthcare providers that you have MRSA. This includes home health nurses and aides, therapists, and personnel in doctors' offices.   Your doctor may have more instructions for you.    If you have questions, please ask your doctor or nurse.  Co-sponsored by The Society for Healthcare Epidemiology of America (SHEA); Infectious Diseases Society of America (IDSA); American Hospital Association; Association for Professionals in Infection Control and Epidemiology (APIC); Centers for Disease Control and Prevention (CDC); and The Joint Commission.  This information is not intended to replace advice given to you by your health care provider. Make sure you discuss any questions you have with your health care provider.  Document Released: 07/22/2013 Document Revised: 12/23/2015 Document Reviewed: 09/30/2014  Elsevier Interactive Patient Education  2018 [employer]

## 2017-11-28 NOTE — Progress Notes (Signed)
  Subjective:     Patient ID: Paul Morrison, male   DOB: [birth year], 62 y.o.   MRN: [id number]  HPI Patient here requesting "MRSA screening ". His mother currently hospitalized and apparently had positive intranasal swab and hascomplicated pneumonia. Patient's wife apparently takes immunosuppressant he was very concerned about his risk of transmission to family. Patient had previous knee replacement surgery and had MRSA screening prior to that which was negative. He has no known history of MRSA. Denies any recent skin abscesses.  Patient overdue for repeat colonoscopy. He apparently had adenomatous polyps on colonoscopy about 7 years ago. Needs follow-up.  No recent reported change in stool habits  Past Medical History:  Diagnosis Date  . Hyperlipidemia   . Hypertension   . Low back pain    Past Surgical History:  Procedure Laterality Date  . HERNIA REPAIR    . INJECTION KNEE     both knees  . keratatomy    . knee arthroscopy    . LUMBAR FUSION    . TOTAL KNEE ARTHROPLASTY Right 11/24/2016   Procedure: RIGHT TOTAL KNEE ARTHROPLASTY;  Surgeon: [name], MD;  Location: [facility];  Service: Orthopedics;  Laterality: Right;  Marland Kitchen VASECTOMY      reports that he has quit smoking. He has never used smokeless tobacco. He reports that he drinks alcohol. He reports that he does not use drugs. family history includes Aneurysm in his unknown relative; Asthma in his mother; Coronary artery disease in his unknown relative; Hyperlipidemia in his mother; Hypertension in his mother. Allergies  Allergen Reactions  . Cephalexin Other (See Comments)    Stomach cramping   . Celecoxib Rash      Review of Systems  Constitutional: Negative for chills and fever.  Respiratory: Negative for cough.   Skin: Negative for rash and wound.       Objective:   Physical Exam  Constitutional: He appears well-developed and well-nourished.  Cardiovascular: Normal rate, regular rhythm and normal heart  sounds.  Pulmonary/Chest: Effort normal and breath sounds normal.  Skin: No rash noted.       Assessment:     #1 patient requesting MRSA "screening". He is asymptomatic. Was recently around his mother who is currently hospitalized with positive MRSA  #2 history of adenomatous colon polyps    Plan:     -Set up referral to GI for repeat colonoscopy -Patient basically requesting MRSA screening. We explained that colonization can be in more than one site such as under the nails, intransal, etc but he is requesting intranasal swab and this will be sent. If positive intranasal Bactroban ointment twice daily for 5 days -We discussed regular use of good anti-staph soap and also frequent handwashing  Eulas Post MD [facility] at [address] [facility]

## 2017-11-30 ENCOUNTER — Encounter: Payer: Self-pay | Admitting: Family Medicine

## 2017-11-30 LAB — MRSA CULTURE
MICRO NUMBER:: 90531642
SPECIMEN QUALITY: ADEQUATE

## 2017-12-03 ENCOUNTER — Other Ambulatory Visit: Payer: Self-pay | Admitting: Family Medicine

## 2017-12-07 ENCOUNTER — Encounter: Payer: Self-pay | Admitting: Family Medicine

## 2017-12-10 ENCOUNTER — Encounter: Payer: Self-pay | Admitting: Gastroenterology

## 2018-01-14 ENCOUNTER — Encounter: Payer: Self-pay | Admitting: Family Medicine

## 2018-01-16 ENCOUNTER — Encounter: Payer: Self-pay | Admitting: Family Medicine

## 2018-01-16 MED ORDER — SILDENAFIL CITRATE 20 MG PO TABS: ORAL_TABLET | ORAL | 5 refills | Status: DC

## 2018-01-16 MED ORDER — HYDROCODONE-ACETAMINOPHEN 5-325 MG PO TABS: ORAL_TABLET | ORAL | 0 refills | Status: DC

## 2018-01-16 NOTE — Telephone Encounter (Signed)
Alprazolam refill not documented in chart. Please place refill in Epic.

## 2018-01-16 NOTE — Addendum Note (Signed)
Addended by: Rebecca Eaton on: 01/16/2018 01:07 PM   Modules accepted: Orders

## 2018-01-16 NOTE — Telephone Encounter (Signed)
Alprazolam has been phoned in to [facility] and sildenafil was sent electronically.

## 2018-02-27 ENCOUNTER — Encounter: Payer: Self-pay | Admitting: Gastroenterology

## 2018-02-27 ENCOUNTER — Ambulatory Visit: Payer: BLUE CROSS/BLUE SHIELD | Admitting: Family Medicine

## 2018-02-27 DIAGNOSIS — Z0289 Encounter for other administrative examinations: Secondary | ICD-10-CM

## 2018-03-01 ENCOUNTER — Encounter: Payer: Self-pay | Admitting: Family Medicine

## 2018-03-04 ENCOUNTER — Encounter: Payer: Self-pay | Admitting: Family Medicine

## 2018-03-04 ENCOUNTER — Other Ambulatory Visit: Payer: Self-pay | Admitting: Family Medicine

## 2018-03-04 ENCOUNTER — Encounter: Payer: Self-pay | Admitting: Gastroenterology

## 2018-03-05 MED ORDER — MUPIROCIN 2 % EX OINT: TOPICAL_OINTMENT | CUTANEOUS | 0 refills | Status: DC

## 2018-03-05 MED ORDER — DICLOFENAC SODIUM 75 MG PO TBEC: 75.0000 mg | DELAYED_RELEASE_TABLET | Freq: Two times a day (BID) | ORAL | 1 refills | Status: DC

## 2018-03-06 ENCOUNTER — Ambulatory Visit (INDEPENDENT_AMBULATORY_CARE_PROVIDER_SITE_OTHER): Payer: BLUE CROSS/BLUE SHIELD | Admitting: Family Medicine

## 2018-03-06 ENCOUNTER — Encounter: Payer: Self-pay | Admitting: Family Medicine

## 2018-03-06 VITALS — BP 110/80 | HR 88 | Temp 98.1°F | Wt 251.6 lb

## 2018-03-06 DIAGNOSIS — M1A9XX1 Chronic gout, unspecified, with tophus (tophi): Secondary | ICD-10-CM

## 2018-03-06 DIAGNOSIS — L739 Follicular disorder, unspecified: Secondary | ICD-10-CM

## 2018-03-06 DIAGNOSIS — M25571 Pain in right ankle and joints of right foot: Secondary | ICD-10-CM

## 2018-03-06 MED ORDER — COLCHICINE 0.6 MG PO TABS
ORAL_TABLET | ORAL | 1 refills | Status: DC
Start: 2018-03-06 — End: 2019-06-11

## 2018-03-06 MED ORDER — INDOMETHACIN 50 MG PO CAPS: 50.0000 mg | ORAL_CAPSULE | Freq: Three times a day (TID) | ORAL | 1 refills | Status: DC | PRN

## 2018-03-06 NOTE — Patient Instructions (Signed)

## 2018-03-06 NOTE — Progress Notes (Signed)
Subjective:     Patient ID: Paul Morrison, male   DOB: June 08, 1956, 62 y.o.   MRN: [id number]  HPI Patient seen with some recent mostly right foot pain MTP joint. He was on a trip to [place]. He ate a lot of oysters and was drink some alcohol. He had fairly acute onset of pain redness and swelling metatarsophalangeal joint. Couple years ago he had a couple orthopedic procedures and orthopedist thought he may have had either gout or pseudogout. He briefly took colchicine and indomethacin. No known family history of gout. He is not aware of previous knee or upper extremity involvement.  Patient had a groin rash after his [place] trip after getting in a hot tub. He thinks this might have been a folliculitis. He has a picture on his phone. Small erythematous papules. Resolved after about 3 days. No associated fever  Patient has a whitish "spot "right external ear. Noted weeks ago. Nontender.  Past Medical History:  Diagnosis Date  . Hyperlipidemia   . Hypertension   . Low back pain    Past Surgical History:  Procedure Laterality Date  . HERNIA REPAIR    . INJECTION KNEE     both knees  . keratatomy    . knee arthroscopy    . LUMBAR FUSION    . TOTAL KNEE ARTHROPLASTY Right 11/24/2016   Procedure: RIGHT TOTAL KNEE ARTHROPLASTY;  Surgeon: [name], MD;  Location: [facility];  Service: Orthopedics;  Laterality: Right;  Marland Kitchen VASECTOMY      reports that he has quit smoking. He has never used smokeless tobacco. He reports that he drinks alcohol. He reports that he does not use drugs. family history includes Aneurysm in his unknown relative; Asthma in his mother; Coronary artery disease in his unknown relative; Hyperlipidemia in his mother; Hypertension in his mother. Allergies  Allergen Reactions  . Cephalexin Other (See Comments)    Stomach cramping   . Celecoxib Rash     Review of Systems  Constitutional: Negative for chills, fatigue and fever.  Musculoskeletal: Positive for  arthralgias.  Skin: Positive for rash (as per HPI).  Neurological: Negative for weakness.  Hematological: Negative for adenopathy.       Objective:   Physical Exam  Constitutional: He appears well-developed and well-nourished.  HENT:  Right external ear patient has small whitish deposit of the skin. This looks like a small tophaceous deposit. No ulcerative changes. Nontender.  Cardiovascular: Normal rate and regular rhythm.  Pulmonary/Chest: Effort normal and breath sounds normal.  Musculoskeletal: He exhibits no edema.  He does have some mild erythema right metatarsophalangeal joint and mild tenderness to palpation.       Assessment:     #1 probable acute gout involving right foot. He's had similar flareups previously.  #2 recent rash in his groin region which sounded like hot tub folliculitis currently resolved  #3 right ear abnormalities as above. Question tophaceous deposit    Plan:     -Long discussion regarding acute and chronic management of gout. We discussed food triggers. Avoid dehydration. We discussed alcohol as a potential trigger -Prescription for colchicine 0.6 mg 2 at onset of acute gout and may repeat one every 12 hours as needed. Also prescription for indomethacin 50 mg every 8 hours take with food as needed for acute gout. -We mentioned potential role of allopurinol for prevention but at this point he is not interested in starting daily medication Set up complete physical. Consider uric acid level with  physical-  Eulas Post MD [facility] at [facility]

## 2018-03-07 ENCOUNTER — Encounter: Payer: Self-pay | Admitting: Family Medicine

## 2018-03-20 ENCOUNTER — Encounter: Payer: Self-pay | Admitting: Family Medicine

## 2018-03-27 ENCOUNTER — Encounter: Payer: Self-pay | Admitting: Family Medicine

## 2018-03-27 ENCOUNTER — Ambulatory Visit (INDEPENDENT_AMBULATORY_CARE_PROVIDER_SITE_OTHER): Payer: BLUE CROSS/BLUE SHIELD | Admitting: Family Medicine

## 2018-03-27 VITALS — BP 110/80 | HR 76 | Temp 97.8°F | Ht 74.0 in | Wt 246.2 lb

## 2018-03-27 DIAGNOSIS — Z23 Encounter for immunization: Secondary | ICD-10-CM | POA: Diagnosis not present

## 2018-03-27 DIAGNOSIS — Z0000 Encounter for general adult medical examination without abnormal findings: Secondary | ICD-10-CM | POA: Diagnosis not present

## 2018-03-27 DIAGNOSIS — Z125 Encounter for screening for malignant neoplasm of prostate: Secondary | ICD-10-CM | POA: Diagnosis not present

## 2018-03-27 DIAGNOSIS — M1A9XX1 Chronic gout, unspecified, with tophus (tophi): Secondary | ICD-10-CM

## 2018-03-27 DIAGNOSIS — M109 Gout, unspecified: Secondary | ICD-10-CM | POA: Insufficient documentation

## 2018-03-27 MED ORDER — LOSARTAN POTASSIUM 100 MG PO TABS: 100.0000 mg | ORAL_TABLET | Freq: Every day | ORAL | 11 refills | Status: DC

## 2018-03-27 NOTE — Patient Instructions (Addendum)
Consider shingles vaccine (Shingrix) and check on insurance coverage if interested.  Stop the Losartan HCTZ and start the plain Losartan.

## 2018-03-27 NOTE — Progress Notes (Signed)
Subjective:     Patient ID: Paul Morrison, male   DOB: [birth year], 62 y.o.   MRN: [id number]  HPI Patient here for complete physical.he has history of hypertension, low testosterone, bilateral conductive hearing loss, osteoarthritis, hyperlipidemia. He went to [facility] last year [place] and apparently had placement of testosterone pellets. 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 He has not had any follow-up labs since these were placed 6 months ago.  He has gout.  Has had more frequent flare-ups during past year. He had recent observation of tophaceous gout right external ear. Does take losartan HCTZ for hypertension and we had mentioned that thiazides can worsen gout.  Still drinks about 2 beers per day  He is scheduled for repeat colonoscopy this fall. No history of shingles vaccine. Needs flu vaccine. Tetanus up-to-date.  Past Medical History:  Diagnosis Date  . Hyperlipidemia   . Hypertension   . Low back pain    Past Surgical History:  Procedure Laterality Date  . HERNIA REPAIR    . INJECTION KNEE     both knees  . keratatomy    . knee arthroscopy    . LUMBAR FUSION    . TOTAL KNEE ARTHROPLASTY Right 11/24/2016   Procedure: RIGHT TOTAL KNEE ARTHROPLASTY;  Surgeon: [name], MD;  Location: [facility];  Service: Orthopedics;  Laterality: Right;  Marland Kitchen VASECTOMY      reports that he has quit smoking. He has never used smokeless tobacco. He reports that he drinks alcohol. He reports that he does not use drugs. family history includes Aneurysm in his unknown relative; Asthma in his mother; Coronary artery disease in his unknown relative; Hyperlipidemia in his mother; Hypertension in his mother. Allergies  Allergen Reactions  . Cephalexin Other (See Comments)    Stomach cramping   . Celecoxib Rash     Review of Systems  Constitutional: Negative for activity change, appetite change, fatigue and fever.  HENT:  Negative for congestion, ear pain and trouble swallowing.   Eyes: Negative for pain and visual disturbance.  Respiratory: Negative for cough, shortness of breath and wheezing.   Cardiovascular: Negative for chest pain and palpitations.  Gastrointestinal: Negative for abdominal distention, abdominal pain, blood in stool, constipation, diarrhea, nausea, rectal pain and vomiting.  Genitourinary: Negative for dysuria, hematuria and testicular pain.  Musculoskeletal: Negative for arthralgias and joint swelling.  Skin: Negative for rash.  Neurological: Negative for dizziness, syncope and headaches.  Hematological: Negative for adenopathy.  Psychiatric/Behavioral: Negative for confusion and dysphoric mood.       Objective:   Physical Exam  Constitutional: He is oriented to person, place, and time. He appears well-developed and well-nourished. No distress.  HENT:  Head: Normocephalic and atraumatic.  Right Ear: External ear normal.  Left Ear: External ear normal.  Mouth/Throat: Oropharynx is clear and moist.  Eyes: Pupils are equal, round, and reactive to light. Conjunctivae and EOM are normal.  Neck: Normal range of motion. Neck supple. No thyromegaly present.  Cardiovascular: Normal rate, regular rhythm and normal heart sounds.  No murmur heard. Pulmonary/Chest: No respiratory distress. He has no wheezes. He has no rales.  Abdominal: Soft. Bowel sounds are normal. He exhibits no distension and no mass. There is no tenderness. There is no rebound and no guarding.  Musculoskeletal: He exhibits no edema.  Lymphadenopathy:    He has no cervical adenopathy.  Neurological: He is alert and oriented to person, place, and time. He displays normal reflexes.  No cranial nerve deficit.  Skin: No rash noted.  Small tophaceous deposit right external ear  Psychiatric: He has a normal mood and affect.       Assessment:     Physical exam. Several health maintenance issues addressed as below     Plan:     -flu vaccine given -Discussed shingles vaccine and he will check on insurance coverage -Obtain lab work and will include uric acid level -Discontinue losartan HCTZ. Start plain losartan 100 mg once daily -Monitor blood pressure and be in touch if consistently greater than 140/90 after blood pressure medication change  Eulas Post MD [facility] at [facility]

## 2018-03-28 LAB — BASIC METABOLIC PANEL
BUN: 20 mg/dL (ref 6–23)
CHLORIDE: 100 meq/L (ref 96–112)
CO2: 30 meq/L (ref 19–32)
Calcium: 9.8 mg/dL (ref 8.4–10.5)
Creatinine, Ser: 1.16 mg/dL (ref 0.40–1.50)
GFR: 67.99 mL/min (ref >60.00)
Glucose, Bld: 92 mg/dL (ref 70–99)
POTASSIUM: 4.3 meq/L (ref 3.5–5.1)
Sodium: 137 mEq/L (ref 135–145)

## 2018-03-28 LAB — LIPID PANEL
CHOL/HDL RATIO: 7
CHOLESTEROL: 294 mg/dL — AB (ref 0–200)
HDL: 43.9 mg/dL (ref >39.00)
NONHDL: 250.06
Triglycerides: 287 mg/dL — ABNORMAL HIGH (ref 0.0–149.0)
VLDL: 57.4 mg/dL — AB (ref 0.0–40.0)

## 2018-03-28 LAB — CBC WITH DIFFERENTIAL/PLATELET
BASOS ABS: 0.1 10*3/uL (ref 0.0–0.1)
Basophils Relative: 0.8 % (ref 0.0–3.0)
EOS ABS: 0.2 10*3/uL (ref 0.0–0.7)
Eosinophils Relative: 2.1 % (ref 0.0–5.0)
HEMATOCRIT: 49 % (ref 39.0–52.0)
Hemoglobin: 16.8 g/dL (ref 13.0–17.0)
LYMPHS PCT: 23 % (ref 12.0–46.0)
Lymphs Abs: 2 10*3/uL (ref 0.7–4.0)
MCHC: 34.2 g/dL (ref 30.0–36.0)
MCV: 92 fl (ref 78.0–100.0)
Monocytes Absolute: 0.8 10*3/uL (ref 0.1–1.0)
Monocytes Relative: 9 % (ref 3.0–12.0)
NEUTROS ABS: 5.6 10*3/uL (ref 1.4–7.7)
NEUTROS PCT: 65.1 % (ref 43.0–77.0)
PLATELETS: 196 10*3/uL (ref 150.0–400.0)
RBC: 5.33 Mil/uL (ref 4.22–5.81)
RDW: 13.7 % (ref 11.5–15.5)
WBC: 8.5 10*3/uL (ref 4.0–10.5)

## 2018-03-28 LAB — PSA: PSA: 1.44 ng/mL (ref 0.10–4.00)

## 2018-03-28 LAB — HEPATIC FUNCTION PANEL
ALT: 25 U/L (ref 0–53)
AST: 22 U/L (ref 0–37)
Albumin: 4.3 g/dL (ref 3.5–5.2)
Alkaline Phosphatase: 43 U/L (ref 39–117)
Bilirubin, Direct: 0.1 mg/dL (ref 0.0–0.3)
TOTAL PROTEIN: 6.6 g/dL (ref 6.0–8.3)
Total Bilirubin: 0.8 mg/dL (ref 0.2–1.2)

## 2018-03-28 LAB — URIC ACID: URIC ACID, SERUM: 8.6 mg/dL — AB (ref 4.0–7.8)

## 2018-03-28 LAB — TSH: TSH: 3.78 u[IU]/mL (ref 0.35–4.50)

## 2018-03-28 LAB — LDL CHOLESTEROL, DIRECT: Direct LDL: 210 mg/dL

## 2018-04-09 ENCOUNTER — Encounter: Payer: Self-pay | Admitting: Family Medicine

## 2018-04-09 NOTE — Telephone Encounter (Signed)
Hello Dr. Elease Hashimoto, I would like to discuss lab results with you. I prefer a phone call but I canschedule to see you at your office. I can talk during the day. I am usually exercising in the early evening.  Thanks, Alex  Dr. Elease Hashimoto,  Please advise if you can call the pt or if we need to schedule an appt for the pt to come in and discuss results.  Thank you.

## 2018-04-12 ENCOUNTER — Encounter: Payer: Self-pay | Admitting: Family Medicine

## 2018-04-12 NOTE — Telephone Encounter (Signed)
Dr. Burchette please advise. Thanks  

## 2018-04-15 MED ORDER — ATORVASTATIN CALCIUM 40 MG PO TABS: 40.0000 mg | ORAL_TABLET | Freq: Every day | ORAL | 3 refills | Status: DC

## 2018-04-15 MED ORDER — ALLOPURINOL 100 MG PO TABS: ORAL_TABLET | ORAL | 6 refills | Status: DC

## 2018-04-15 NOTE — Telephone Encounter (Signed)
I sent in prescriptions for both Lipitor and Allopurinol.  Make sure pt gets follow up appt in about 2 months and will repeat lipids and uric acid then.

## 2018-04-18 ENCOUNTER — Encounter: Payer: Self-pay | Admitting: Gastroenterology

## 2018-04-18 ENCOUNTER — Ambulatory Visit (AMBULATORY_SURGERY_CENTER): Payer: Self-pay | Admitting: *Deleted

## 2018-04-18 VITALS — Ht 74.0 in | Wt 241.0 lb

## 2018-04-18 DIAGNOSIS — Z86010 Personal history of colonic polyps: Secondary | ICD-10-CM

## 2018-04-18 MED ORDER — PEG-KCL-NACL-NASULF-NA ASC-C 140 G PO SOLR: 1.0000 | ORAL | 0 refills | Status: DC

## 2018-04-18 NOTE — Progress Notes (Signed)
No egg or soy allergy known to patient  No issues with past sedation with any surgeries  or procedures, no intubation problems  No diet pills per patient No home 02 use per patient  No blood thinners per patient  Pt denies issues with constipation  No A fib or A flutter  EMMI video sent to pt's e mail  - pt declined  [employer] coupon to pt in [place] today

## 2018-04-19 NOTE — Telephone Encounter (Signed)
Pt called and stated that Dr Elease Hashimoto told him not to take allopurinol (ZYLOPRIM) 100 MG tablet [[id number]]. Pt would like to know what changed. Please advise [phone]

## 2018-05-02 ENCOUNTER — Ambulatory Visit (AMBULATORY_SURGERY_CENTER): Payer: BLUE CROSS/BLUE SHIELD | Admitting: Gastroenterology

## 2018-05-02 ENCOUNTER — Encounter: Payer: Self-pay | Admitting: Gastroenterology

## 2018-05-02 VITALS — BP 127/86 | HR 57 | Temp 98.0°F | Resp 12 | Ht 74.0 in | Wt 241.0 lb

## 2018-05-02 DIAGNOSIS — Z86010 Personal history of colonic polyps: Secondary | ICD-10-CM

## 2018-05-02 DIAGNOSIS — D124 Benign neoplasm of descending colon: Secondary | ICD-10-CM

## 2018-05-02 DIAGNOSIS — D128 Benign neoplasm of rectum: Secondary | ICD-10-CM

## 2018-05-02 DIAGNOSIS — D123 Benign neoplasm of transverse colon: Secondary | ICD-10-CM | POA: Diagnosis not present

## 2018-05-02 MED ORDER — SODIUM CHLORIDE 0.9 % IV SOLN: 500.0000 mL | Freq: Once | INTRAVENOUS | Status: DC

## 2018-05-02 NOTE — Op Note (Signed)
[place] Patient Name: Paul Morrison Procedure Date: 05/02/2018 8:38 AM MRN: [id number] Endoscopist: Mallie Mussel L. Loletha Carrow , MD Age: 62 Referring MD:  Date of Birth: Nov 30, 1955 Gender: Male Account #: [id number] Procedure:                Colonoscopy Indications:              Surveillance: Personal history of adenomatous                            polyps on last colonoscopy > 5 years ago (60mm                            sigmoid TVA 07/2010) Medicines:                Monitored Anesthesia Care Procedure:                Pre-Anesthesia Assessment:                           - Prior to the procedure, a History and Physical                            was performed, and patient medications and                            allergies were reviewed. The patient's tolerance of                            previous anesthesia was also reviewed. The risks                            and benefits of the procedure and the sedation                            options and risks were discussed with the patient.                            All questions were answered, and informed consent                            was obtained. Prior Anticoagulants: The patient has                            taken no previous anticoagulant or antiplatelet                            agents. ASA Grade Assessment: II - A patient with                            mild systemic disease. After reviewing the risks                            and benefits, the patient was deemed in  satisfactory condition to undergo the procedure.                           After obtaining informed consent, the colonoscope                            was passed under direct vision. Throughout the                            procedure, the patient's blood pressure, pulse, and                            oxygen saturations were monitored continuously. The                            Model PCF-H190DL [id number]) scope was  introduced                            through the anus and advanced to the the cecum,                            identified by appendiceal orifice and ileocecal                            valve. The colonoscopy was performed without                            difficulty. The patient tolerated the procedure                            well. The quality of the bowel preparation was                            good. The ileocecal valve, appendiceal orifice, and                            rectum were photographed. The quality of the bowel                            preparation was evaluated using the BBPS [facility]                            Bowel Preparation Scale) with scores of: Right                            Colon = 2, Transverse Colon = 2 and Left Colon = 2.                            The total BBPS score equals 6.,after lavage. The                            bowel preparation used was Plenvu. Scope In: 8:45:24 AM Scope Out: 9:15:47 AM Scope Withdrawal Time: 0 hours 24  minutes 8 seconds  Total Procedure Duration: 0 hours 30 minutes 23 seconds  Findings:                 Non-thrombosed external hemorrhoids were found on                            perianal exam.                           A 4 mm polyp was found in the mid transverse colon.                            The polyp was flat. The polyp was removed with a                            piecemeal technique using a cold biopsy forceps.                            Resection and retrieval were complete.                           A 6 mm polyp was found in the mid transverse colon.                            The polyp was sessile. The polyp was removed with a                            cold snare. Resection and retrieval were complete.                           A 8 mm polyp was found in the descending colon. The                            polyp was semi-pedunculated. The polyp was removed                            with a hot snare. Resection  and retrieval were                            complete.                           A 5 mm polyp was found in the rectum. The polyp was                            semi-pedunculated. The polyp was removed with a hot                            snare. Resection and retrieval were complete.                           The exam was otherwise without abnormality on  direct and retroflexion views. Complications:            No immediate complications. Estimated Blood Loss:     Estimated blood loss was minimal. Impression:               - Hemorrhoids found on perianal exam.                           - One 4 mm polyp in the mid transverse colon,                            removed piecemeal using a cold biopsy forceps.                            Resected and retrieved.                           - One 6 mm polyp in the mid transverse colon,                            removed with a cold snare. Resected and retrieved.                           - One 8 mm polyp in the descending colon, removed                            with a hot snare. Resected and retrieved.                           - One 5 mm polyp in the rectum, removed with a hot                            snare. Resected and retrieved.                           - The examination was otherwise normal on direct                            and retroflexion views. Recommendation:           - Patient has a contact number available for                            emergencies. The signs and symptoms of potential                            delayed complications were discussed with the                            patient. Return to normal activities tomorrow.                            Written discharge instructions were provided to the  patient.                           - Resume previous diet.                           - Continue present medications.                           - Await pathology results.                            - Repeat colonoscopy is recommended for                            surveillance. The colonoscopy date will be                            determined after pathology results from today's                            exam become available for review. Asenath Balash L. Loletha Carrow, MD 05/02/2018 9:21:58 AM This report has been signed electronically.

## 2018-05-02 NOTE — Progress Notes (Signed)
Pt's states no medical or surgical changes since previsit or office visit. 

## 2018-05-02 NOTE — Progress Notes (Signed)
A/ox3 pleased with MAC, report to RN 

## 2018-05-02 NOTE — Progress Notes (Signed)
Called to room to assist during endoscopic procedure.  Patient ID and intended procedure confirmed with present staff. Received instructions for my participation in the procedure from the performing physician.  

## 2018-05-02 NOTE — Patient Instructions (Signed)
YOU HAD AN ENDOSCOPIC PROCEDURE TODAY AT THE [facility]:   Refer to the procedure report that was given to you for any specific questions about what was found during the examination.  If the procedure report does not answer your questions, please call your gastroenterologist to clarify.  If you requested that your care partner not be given the details of your procedure findings, then the procedure report has been included in a sealed envelope for you to review at your convenience later.  YOU SHOULD EXPECT: Some feelings of bloating in the abdomen. Passage of more gas than usual.  Walking can help get rid of the air that was put into your GI tract during the procedure and reduce the bloating. If you had a lower endoscopy (such as a colonoscopy or flexible sigmoidoscopy) you may notice spotting of blood in your stool or on the toilet paper. If you underwent a bowel prep for your procedure, you may not have a normal bowel movement for a few days.  Please Note:  You might notice some irritation and congestion in your nose or some drainage.  This is from the oxygen used during your procedure.  There is no need for concern and it should clear up in a day or so.  SYMPTOMS TO REPORT IMMEDIATELY:   Following lower endoscopy (colonoscopy or flexible sigmoidoscopy):  Excessive amounts of blood in the stool  Significant tenderness or worsening of abdominal pains  Swelling of the abdomen that is new, acute  Fever of 100F or higher  For urgent or emergent issues, a gastroenterologist can be reached at any hour by calling [phone].   DIET:  We do recommend a small meal at first, but then you may proceed to your regular diet.  Drink plenty of fluids but you should avoid alcoholic beverages for 24 hours.  ACTIVITY:  You should plan to take it easy for the rest of today and you should NOT DRIVE or use heavy machinery until tomorrow (because of the sedation medicines used during the test).     FOLLOW UP: Our staff will call the number listed on your records the next business day following your procedure to check on you and address any questions or concerns that you may have regarding the information given to you following your procedure. If we do not reach you, we will leave a message.  However, if you are feeling well and you are not experiencing any problems, there is no need to return our call.  We will assume that you have returned to your regular daily activities without incident.  If any biopsies were taken you will be contacted by phone or by letter within the next 1-3 weeks.  Please call us at [phone] if you have not heard about the biopsies in 3 weeks.    Await for biopsy results Polyps (handout given) Hemorrhoids (handout given)  SIGNATURES/CONFIDENTIALITY: You and/or your care partner have signed paperwork which will be entered into your electronic medical record.  These signatures attest to the fact that that the information above on your After Visit Summary has been reviewed and is understood.  Full responsibility of the confidentiality of this discharge information lies with you and/or your care-partner. 

## 2018-05-03 ENCOUNTER — Telehealth: Payer: Self-pay

## 2018-05-03 NOTE — Telephone Encounter (Signed)
  Follow up Call-  Call back number 05/02/2018  Post procedure Call Back phone  # [phone]  Permission to leave phone message Yes  Some recent data might be hidden     Patient questions:  Do you have a fever, pain , or abdominal swelling? No. Pain Score  0 *  Have you tolerated food without any problems? Yes.    Have you been able to return to your normal activities? Yes.    Do you have any questions about your discharge instructions: Diet   No. Medications  No. Follow up visit  No.  Do you have questions or concerns about your Care? No.  Actions: * If pain score is 4 or above: No action needed, pain <4.

## 2018-05-09 ENCOUNTER — Encounter: Payer: Self-pay | Admitting: Gastroenterology

## 2018-05-13 ENCOUNTER — Encounter: Payer: Self-pay | Admitting: Family Medicine

## 2018-05-13 ENCOUNTER — Other Ambulatory Visit: Payer: Self-pay

## 2018-05-13 MED ORDER — LOSARTAN POTASSIUM 100 MG PO TABS: 100.0000 mg | ORAL_TABLET | Freq: Every day | ORAL | 11 refills | Status: DC

## 2018-05-15 ENCOUNTER — Encounter: Payer: Self-pay | Admitting: Family Medicine

## 2018-07-03 ENCOUNTER — Encounter: Payer: Self-pay | Admitting: Family Medicine

## 2018-07-05 ENCOUNTER — Encounter: Payer: Self-pay | Admitting: Family Medicine

## 2018-07-08 MED ORDER — HYDROCODONE-ACETAMINOPHEN 5-325 MG PO TABS: ORAL_TABLET | ORAL | 0 refills | Status: DC

## 2018-07-10 ENCOUNTER — Other Ambulatory Visit: Payer: Self-pay

## 2018-07-10 DIAGNOSIS — R5383 Other fatigue: Secondary | ICD-10-CM

## 2018-07-10 DIAGNOSIS — E782 Mixed hyperlipidemia: Secondary | ICD-10-CM

## 2018-07-10 DIAGNOSIS — M1000 Idiopathic gout, unspecified site: Secondary | ICD-10-CM

## 2018-07-17 ENCOUNTER — Other Ambulatory Visit (INDEPENDENT_AMBULATORY_CARE_PROVIDER_SITE_OTHER): Payer: BLUE CROSS/BLUE SHIELD

## 2018-07-17 ENCOUNTER — Encounter: Payer: Self-pay | Admitting: Family Medicine

## 2018-07-17 DIAGNOSIS — M1000 Idiopathic gout, unspecified site: Secondary | ICD-10-CM

## 2018-07-17 DIAGNOSIS — E782 Mixed hyperlipidemia: Secondary | ICD-10-CM | POA: Diagnosis not present

## 2018-07-17 DIAGNOSIS — R5383 Other fatigue: Secondary | ICD-10-CM | POA: Diagnosis not present

## 2018-07-17 LAB — HEPATIC FUNCTION PANEL
ALT: 44 U/L (ref 0–53)
AST: 28 U/L (ref 0–37)
Albumin: 4.4 g/dL (ref 3.5–5.2)
Alkaline Phosphatase: 41 U/L (ref 39–117)
Bilirubin, Direct: 0.1 mg/dL (ref 0.0–0.3)
Total Bilirubin: 0.7 mg/dL (ref 0.2–1.2)
Total Protein: 6.7 g/dL (ref 6.0–8.3)

## 2018-07-17 LAB — TESTOSTERONE: Testosterone: 319.73 ng/dL (ref 300.00–890.00)

## 2018-07-17 LAB — LIPID PANEL
CHOL/HDL RATIO: 4
Cholesterol: 190 mg/dL (ref 0–200)
HDL: 48.6 mg/dL (ref >39.00)
LDL Cholesterol: 115 mg/dL — ABNORMAL HIGH (ref 0–99)
NonHDL: 141.73
Triglycerides: 132 mg/dL (ref 0.0–149.0)
VLDL: 26.4 mg/dL (ref 0.0–40.0)

## 2018-07-17 LAB — URIC ACID: Uric Acid, Serum: 6.2 mg/dL (ref 4.0–7.8)

## 2018-08-15 ENCOUNTER — Encounter: Payer: Self-pay | Admitting: Family Medicine

## 2018-08-20 ENCOUNTER — Encounter: Payer: Self-pay | Admitting: Family Medicine

## 2018-08-20 ENCOUNTER — Other Ambulatory Visit: Payer: Self-pay

## 2018-08-20 MED ORDER — SILDENAFIL CITRATE 20 MG PO TABS: ORAL_TABLET | ORAL | 5 refills | Status: DC

## 2018-08-20 MED ORDER — ALPRAZOLAM 0.5 MG PO TABS: 0.5000 mg | ORAL_TABLET | Freq: Three times a day (TID) | ORAL | 0 refills | Status: DC | PRN

## 2018-09-24 ENCOUNTER — Encounter: Payer: Self-pay | Admitting: Family Medicine

## 2018-09-24 ENCOUNTER — Other Ambulatory Visit: Payer: Self-pay

## 2018-09-24 ENCOUNTER — Ambulatory Visit (INDEPENDENT_AMBULATORY_CARE_PROVIDER_SITE_OTHER): Payer: BLUE CROSS/BLUE SHIELD | Admitting: Family Medicine

## 2018-09-24 VITALS — BP 120/76 | HR 78 | Temp 97.4°F | Ht 74.0 in | Wt 248.6 lb

## 2018-09-24 DIAGNOSIS — M1000 Idiopathic gout, unspecified site: Secondary | ICD-10-CM | POA: Diagnosis not present

## 2018-09-24 DIAGNOSIS — M67471 Ganglion, right ankle and foot: Secondary | ICD-10-CM

## 2018-09-24 MED ORDER — ALLOPURINOL 100 MG PO TABS: ORAL_TABLET | ORAL | 6 refills | Status: DC

## 2018-09-24 MED ORDER — PREDNISONE 10 MG PO TABS: ORAL_TABLET | ORAL | 0 refills | Status: DC

## 2018-09-24 NOTE — Progress Notes (Signed)
Subjective:     Patient ID: Paul Morrison, male   DOB: Jun 29, 1956, 63 y.o.   MRN: [id number]  HPI Patient has known history of gout.  He presents with left ankle and foot pain and swelling and redness over the past 5 days.  Denies any injury.  No fevers or chills.  No calf pain.  He had some diclofenac and took some for just a day or so but had some itching of the hands.  He then switched to indomethacin and has been taking that with some mild improvement.  He had been on allopurinol but for some reason he stopped this.  His last uric acid was down to 6.2 when he was on the allopurinol.  He has cystic swelling dorsum of right foot which is only minimally painful.  He noticed this just recently.  Denies any past history of ganglion cyst.  Ambulating without much difficulty.  Past Medical History:  Diagnosis Date  . Arthritis   . Cancer ([place])    skin cancer left ear- MOHS  . Hyperlipidemia   . Hypertension   . Low back pain    Past Surgical History:  Procedure Laterality Date  . COLONOSCOPY    . HERNIA REPAIR    . INJECTION KNEE     both knees  . keratatomy    . knee arthroscopy    . LUMBAR FUSION    . MOHS SURGERY    . POLYPECTOMY    . TOTAL KNEE ARTHROPLASTY Right 11/24/2016   Procedure: RIGHT TOTAL KNEE ARTHROPLASTY;  Surgeon: [name], MD;  Location: [facility];  Service: Orthopedics;  Laterality: Right;  Marland Kitchen VASECTOMY      reports that he has quit smoking. He has never used smokeless tobacco. He reports current alcohol use. He reports that he does not use drugs. family history includes Aneurysm in an other family member; Asthma in his mother; Coronary artery disease in an other family member; Hyperlipidemia in his mother; Hypertension in his mother. Allergies  Allergen Reactions  . Cephalexin Other (See Comments)    Stomach cramping   . Celecoxib Rash    Review of Systems  Constitutional: Negative for chills and fever.  Respiratory: Negative for shortness of breath.    Cardiovascular: Negative for chest pain.  Gastrointestinal: Negative for nausea and vomiting.  Neurological: Negative for weakness.  Hematological: Negative for adenopathy.       Objective:   Physical Exam Constitutional:      Appearance: Normal appearance.  Cardiovascular:     Rate and Rhythm: Normal rate and regular rhythm.  Musculoskeletal:     Comments: Has some swelling erythema and warmth involving the left ankle.  Most of this is centered around the ankle joint region.  Indistinct border to erythema.  Minimally tender.  Full range of motion ankle.  No bony tenderness.  He has mobile nontender proximally 1 cm cystic type lesion on the dorsum of the right foot  Neurological:     Mental Status: He is alert.        Assessment:     #1 acute gout left ankle and foot  #2 benign-appearing ganglion cyst dorsum right foot      Plan:     -Wrote for prednisone taper and be in touch promptly within the next 2 to 3 days if not improving  -We discussed the fact that he has had multiple gout flareups in the past year and even had some tophaceous changes of the ear at one  point.  We recommended they get back on allopurinol but not until a few weeks after this acute flareup subsides.  We will start back allopurinol and titrate very gradually and consider daily nonsteroidal during titration  -Reassurance regarding his ganglion cyst  Eulas Post MD [facility] at [facility]

## 2018-09-24 NOTE — Patient Instructions (Addendum)
Ganglion Cyst  A ganglion cyst is a non-cancerous, fluid-filled lump that occurs near a joint or tendon. The cyst grows out of a joint or the lining of a tendon. Ganglion cysts most often develop in the hand or wrist, but they can also develop in the shoulder, elbow, hip, knee, ankle, or foot. Ganglion cysts are ball-shaped or egg-shaped. Their size can range from the size of a pea to larger than a grape. Increased activity may cause the cyst to get bigger because more fluid starts to build up. What are the causes? The exact cause of this condition is not known, but it may be related to:  Inflammation or irritation around the joint.  An injury.  Repetitive movements or overuse.  Arthritis. What increases the risk? You are more likely to develop this condition if:  You are a woman.  You are 40-59 years old. What are the signs or symptoms? The main symptom of this condition is a lump. It most often appears on the hand or wrist. In many cases, there are no other symptoms, but a cyst can sometimes cause:  Tingling.  Pain.  Numbness.  Muscle weakness.  Weak grip.  Less range of motion in a joint. How is this diagnosed? Ganglion cysts are usually diagnosed based on a physical exam. Your health care provider will feel the lump and may shine a light next to it. If it is a ganglion cyst, the light will likely shine through it. Your health care provider may order an X-ray, ultrasound, or MRI to rule out other conditions. How is this treated? Ganglion cysts often go away on their own without treatment. If you have pain or other symptoms, treatment may be needed. Treatment is also needed if the ganglion cyst limits your movement or if it gets infected. Treatment may include:  Wearing a brace or splint on your wrist or finger.  Taking anti-inflammatory medicine.  Having fluid drained from the lump with a needle (aspiration).  Getting a steroid injected into the joint.  Having  surgery to remove the ganglion cyst.  Placing a pad on your shoe or wearing shoes that will not rub against the cyst if it is on your foot. Follow these instructions at home:  Do not press on the ganglion cyst, poke it with a needle, or hit it.  Take over-the-counter and prescription medicines only as told by your health care provider.  If you have a brace or splint: ? Wear it as told by your health care provider. ? Remove it as told by your health care provider. Ask if you need to remove it when you take a shower or a bath.  Watch your ganglion cyst for any changes.  Keep all follow-up visits as told by your health care provider. This is important. Contact a health care provider if:  Your ganglion cyst becomes larger or more painful.  You have pus coming from the lump.  You have weakness or numbness in the affected area.  You have a fever or chills. Get help right away if:  You have a fever and have any of these in the cyst area: ? Increased redness. ? Red streaks. ? Swelling. Summary  A ganglion cyst is a non-cancerous, fluid-filled lump that occurs near a joint or tendon.  Ganglion cysts most often develop in the hand or wrist, but they can also develop in the shoulder, elbow, hip, knee, ankle, or foot.  Ganglion cysts often go away on their own without treatment.  This information is not intended to replace advice given to you by your health care provider. Make sure you discuss any questions you have with your health care provider. Document Released: 07/14/2000 Document Revised: 03/16/2017 Document Reviewed: 03/16/2017 Elsevier Interactive Patient Education  2019 [employer].  Let me know in 2-3 days if left ankle not improving.    Wait at least 2-3 weeks after acute gout subsides before starting the Allopurinol.

## 2018-09-27 ENCOUNTER — Encounter: Payer: Self-pay | Admitting: Family Medicine

## 2018-11-14 ENCOUNTER — Encounter: Payer: Self-pay | Admitting: Family Medicine

## 2018-11-15 MED ORDER — ALPRAZOLAM 0.5 MG PO TABS: 0.5000 mg | ORAL_TABLET | Freq: Three times a day (TID) | ORAL | 0 refills | Status: DC | PRN

## 2018-11-15 NOTE — Telephone Encounter (Signed)
I refilled once..  Avoid regular use.

## 2019-01-06 ENCOUNTER — Other Ambulatory Visit: Payer: Self-pay

## 2019-01-06 ENCOUNTER — Ambulatory Visit (INDEPENDENT_AMBULATORY_CARE_PROVIDER_SITE_OTHER): Payer: BC Managed Care – PPO | Admitting: Family Medicine

## 2019-01-06 DIAGNOSIS — H02846 Edema of left eye, unspecified eyelid: Secondary | ICD-10-CM

## 2019-01-06 MED ORDER — PREDNISONE 10 MG PO TABS: ORAL_TABLET | ORAL | 0 refills | Status: DC

## 2019-01-06 NOTE — Progress Notes (Signed)
Patient ID: Paul Morrison, male   DOB: 1955-12-29, 63 y.o.   MRN: [id number]  This visit type was conducted due to national recommendations for restrictions regarding the COVID-19 pandemic in an effort to limit this patient's exposure and mitigate transmission in our community.   Virtual Visit via Video Note  I connected with [name] on 01/06/19 at 10:00 AM EDT by a video enabled telemedicine application and verified that I am speaking with the correct person using two identifiers.  Location patient: home Location provider:work or home office Persons participating in the virtual visit: patient, provider  I discussed the limitations of evaluation and management by telemedicine and the availability of in person appointments. The patient expressed understanding and agreed to proceed.   HPI: Patient relates onset last Tuesday of some swelling left upper eyelid.  No some itching and puffiness along with mild erythema.  No injury.  No rash.  No significant pain.  No blurred vision.  Symptoms seem to be better late last week but then seemed to worsen over the weekend.  He did have some recurrent swelling of left foot and is been on prednisone previously for gout.  He took some prednisone late last week which may have led to his initial improvement.  He only had about 3 days worth.  No drainage from the eye.  No crusting.   ROS: See pertinent positives and negatives per HPI.  Past Medical History:  Diagnosis Date  . Arthritis   . Cancer ([place])    skin cancer left ear- MOHS  . Hyperlipidemia   . Hypertension   . Low back pain     Past Surgical History:  Procedure Laterality Date  . COLONOSCOPY    . HERNIA REPAIR    . INJECTION KNEE     both knees  . keratatomy    . knee arthroscopy    . LUMBAR FUSION    . MOHS SURGERY    . POLYPECTOMY    . TOTAL KNEE ARTHROPLASTY Right 11/24/2016   Procedure: RIGHT TOTAL KNEE ARTHROPLASTY;  Surgeon: [name], MD;  Location: [facility];  Service:  Orthopedics;  Laterality: Right;  Marland Kitchen VASECTOMY      Family History  Problem Relation Age of Onset  . Hyperlipidemia Mother   . Hypertension Mother   . Asthma Mother   . Coronary artery disease Other   . Aneurysm Other   . Colon cancer Neg Hx   . Colon polyps Neg Hx   . Esophageal cancer Neg Hx   . Rectal cancer Neg Hx   . Stomach cancer Neg Hx     SOCIAL HX: Non-smoker   Current Outpatient Medications:  .  allopurinol (ZYLOPRIM) 100 MG tablet, Take one tablet daily for two weeks and then two tablets daily for two weeks and then three tablets daily., Disp: 90 tablet, Rfl: 6 .  ALPRAZolam (XANAX) 0.5 MG tablet, Take 1 tablet (0.5 mg total) by mouth 3 (three) times daily as needed for anxiety., Disp: 30 tablet, Rfl: 0 .  atorvastatin (LIPITOR) 40 MG tablet, Take 1 tablet (40 mg total) by mouth daily., Disp: 90 tablet, Rfl: 3 .  colchicine 0.6 MG tablet, Take two at onset of gout and then one every 12 hours as needed (Patient not taking: Reported on 04/18/2018), Disp: 60 tablet, Rfl: 1 .  diclofenac (VOLTAREN) 75 MG EC tablet, Take 1 tablet (75 mg total) by mouth 2 (two) times daily with a meal. As needed for arthritis pain (Patient  not taking: Reported on 04/18/2018), Disp: 60 tablet, Rfl: 1 .  Glucosamine-Chondroitin (COSAMIN DS PO), Take 1 capsule by mouth daily as needed (pain)., Disp: , Rfl:  .  HYDROcodone-acetaminophen (NORCO/VICODIN) 5-325 MG tablet, One po q 6 hours prn pain.  May refill on or after 06-25-17, Disp: 20 tablet, Rfl: 0 .  indomethacin (INDOCIN) 50 MG capsule, Take 1 capsule (50 mg total) by mouth 3 (three) times daily as needed., Disp: 30 capsule, Rfl: 1 .  losartan (COZAAR) 100 MG tablet, Take 1 tablet (100 mg total) by mouth daily., Disp: 30 tablet, Rfl: 11 .  mupirocin ointment (BACTROBAN) 2 %, Apply intranasal bid for 5 days. (Patient not taking: Reported on 04/18/2018), Disp: 22 g, Rfl: 1 .  predniSONE (DELTASONE) 10 MG tablet, Taper as follows:  4-4-4-4-3-3-3-2-2-1-1, Disp: 31 tablet, Rfl: 0 .  sildenafil (REVATIO) 20 MG tablet, Take 2 to 5 tablets one hour prior to sexual activity, Disp: 50 tablet, Rfl: 5  Current Facility-Administered Medications:  .  0.9 %  sodium chloride infusion, 500 mL, Intravenous, Once, [name], [name], MD  EXAM:  VITALS per patient if applicable:  GENERAL: alert, oriented, appears well and in no acute distress  HEENT: atraumatic, conjunttiva clear, no obvious abnormalities on inspection of external nose and ears Mild swelling left upper lid.  No visible rashes.  NECK: normal movements of the head and neck  LUNGS: on inspection no signs of respiratory distress, breathing rate appears normal, no obvious gross SOB, gasping or wheezing  CV: no obvious cyanosis  MS: moves all visible extremities without noticeable abnormality  PSYCH/NEURO: pleasant and cooperative, no obvious depression or anxiety, speech and thought processing grossly intact  ASSESSMENT AND PLAN:  Discussed the following assessment and plan:  Left upper eyelid swelling and itching.  Suspect allergic-?contact ermatitis.  He does not have any red flags such as blurred vision, pain, blistering  -Recommend prednisone taper -Follow-up immediately for any increased swelling, blurred vision, or other concerns    I discussed the assessment and treatment plan with the patient. The patient was provided an opportunity to ask questions and all were answered. The patient agreed with the plan and demonstrated an understanding of the instructions.   The patient was advised to call back or seek an in-person evaluation if the symptoms worsen or if the condition fails to improve as anticipated.   [name], MD

## 2019-02-25 ENCOUNTER — Other Ambulatory Visit: Payer: Self-pay | Admitting: Family Medicine

## 2019-03-27 ENCOUNTER — Other Ambulatory Visit: Payer: Self-pay | Admitting: Family Medicine

## 2019-04-10 ENCOUNTER — Encounter: Payer: Self-pay | Admitting: Family Medicine

## 2019-04-15 ENCOUNTER — Telehealth: Payer: Self-pay | Admitting: Family Medicine

## 2019-04-15 NOTE — Telephone Encounter (Signed)
Medication Refill - Medication: HYDROcodone-acetaminophen (NORCO/VICODIN) 5-325 MG tablet  Last time he only received 20 tablets and wants to make sure he gets 30/ please advise once Rx is sent   Has the patient contacted their pharmacy? No. (Agent: If no, request that the patient contact the pharmacy for the refill.) (Agent: If yes, when and what did the pharmacy advise?)  Preferred Pharmacy (with phone number or street name):  [place], [place] - [address] [phone] (Phone) [phone] (Fax)     Agent: Please be advised that RX refills may take up to 3 business days. We ask that you follow-up with your pharmacy.

## 2019-04-16 ENCOUNTER — Other Ambulatory Visit: Payer: Self-pay | Admitting: Family Medicine

## 2019-04-16 MED ORDER — HYDROCODONE-ACETAMINOPHEN 5-325 MG PO TABS: ORAL_TABLET | ORAL | 0 refills | Status: DC

## 2019-04-16 NOTE — Telephone Encounter (Signed)
Please see message. °

## 2019-04-16 NOTE — Telephone Encounter (Signed)
Called patient and he stated that he usually gets 30 tablets of his hydrocodone and they last anywhere between 3-4 months. Patient stated that he will call back closer to time to schedule an appointment if he can get a 30 tablet supply again.

## 2019-04-16 NOTE — Telephone Encounter (Signed)
We refilled once and I know he takes this infrequently.  We are limited in quantity because of state regulations for patients that are not on chronic opioid therapy.

## 2019-05-13 ENCOUNTER — Other Ambulatory Visit: Payer: Self-pay

## 2019-05-13 DIAGNOSIS — Z20822 Contact with and (suspected) exposure to covid-19: Secondary | ICD-10-CM

## 2019-05-14 LAB — NOVEL CORONAVIRUS, NAA: SARS-CoV-2, NAA: NOT DETECTED

## 2019-05-15 ENCOUNTER — Encounter: Payer: Self-pay | Admitting: Family Medicine

## 2019-05-19 ENCOUNTER — Encounter: Payer: Self-pay | Admitting: Family Medicine

## 2019-05-22 ENCOUNTER — Other Ambulatory Visit: Payer: Self-pay | Admitting: Family Medicine

## 2019-05-22 ENCOUNTER — Telehealth: Payer: Self-pay

## 2019-05-22 DIAGNOSIS — I10 Essential (primary) hypertension: Secondary | ICD-10-CM

## 2019-05-22 DIAGNOSIS — Z0000 Encounter for general adult medical examination without abnormal findings: Secondary | ICD-10-CM

## 2019-05-22 DIAGNOSIS — M1000 Idiopathic gout, unspecified site: Secondary | ICD-10-CM

## 2019-05-22 DIAGNOSIS — E782 Mixed hyperlipidemia: Secondary | ICD-10-CM

## 2019-05-22 NOTE — Telephone Encounter (Signed)
Requested medication (s) are due for refill today: yes  Requested medication (s) are on the active medication list: yes  Last refill:  01/06/2019  Future visit scheduled: no  Notes to clinic:  Refill cannot be delegated    Requested Prescriptions  Pending Prescriptions Disp Refills   predniSONE (DELTASONE) 10 MG tablet 31 tablet 0    Sig: Taper as follows: 4-4-4-4-3-3-3-2-2-1-1     Not Delegated - Endocrinology:  Oral Corticosteroids Failed - 05/22/2019 11:46 AM      Failed - This refill cannot be delegated      Passed - Last BP in normal range    BP Readings from Last 1 Encounters:  09/24/18 120/76         Passed - Valid encounter within last 6 months    Recent Outpatient Visits          4 months ago Swelling of left eyelid   [facility], Alinda Sierras, MD   8 months ago Idiopathic gout, unspecified chronicity, unspecified site   [employer] [facility], Alinda Sierras, MD   1 year ago Physical exam   [facility], Alinda Sierras, MD   1 year ago Arthralgia of right foot   [facility], Alinda Sierras, MD   1 year ago MRSA exposure   [facility] at [facility], Alinda Sierras, MD

## 2019-05-22 NOTE — Telephone Encounter (Signed)
Copied from [place] [id number]. Topic: Quick Communication - Rx Refill/Question >> May 22, 2019 11:36 AM Rainey Pines A wrote: Medication: predniSONE (DELTASONE) 10 MG tablet  Has the patient contacted their pharmacy? Yes (Agent: If no, request that the patient contact the pharmacy for the refill.) (Agent: If yes, when and what did the pharmacy advise?)Contact PCP  Preferred Pharmacy (with phone number or street name): [place], [place] - [address] [phone] (Phone) [phone] (Fax)    Agent: Please be advised that RX refills may take up to 3 business days. We ask that you follow-up with your pharmacy.

## 2019-05-22 NOTE — Telephone Encounter (Signed)
Please advise. There are no active lab orders in the patients chart.

## 2019-05-22 NOTE — Telephone Encounter (Signed)
Copied from [place] [phone]. Topic: Appointment Scheduling - Scheduling Inquiry for Clinic >> May 22, 2019 11:33 AM Rainey Pines A wrote: Patient would like schedule his labs for cpe at the [facility] the order hasnt been placed yet. Please advise

## 2019-05-23 MED ORDER — PREDNISONE 10 MG PO TABS: ORAL_TABLET | ORAL | 0 refills | Status: DC

## 2019-05-23 NOTE — Telephone Encounter (Signed)
Future labs placed. 

## 2019-05-23 NOTE — Telephone Encounter (Signed)
Dr. Elease Hashimoto, this pt states he always prior to his CPE gets his labs done so you can go over during that visit. Do you want to pend lab orders?     Arbie Cookey, this pt needs to be scheduled for a CPE on Friday 05/30/19 if possible. If not able please call pt to offer next opening. He would like mornings

## 2019-05-23 NOTE — Telephone Encounter (Signed)
Refill once 

## 2019-05-23 NOTE — Telephone Encounter (Signed)
Go ahead and place orders for CBC, comprehensive metabolic panel, TSH, lipid panel, PSA, uric acid I will be out of town 05/30/2019-so his physical would have to be after that.  I will be gone the first week of November

## 2019-05-23 NOTE — Telephone Encounter (Signed)
I have the patient scheduled for his CPE on 06/11/2019 with [facility].   The patient is wanting to have his labs at the [place] office. Can you send the orders over there please?

## 2019-05-27 NOTE — Telephone Encounter (Signed)
mychart message sent to pt informing him that those labs have been placed

## 2019-05-28 ENCOUNTER — Other Ambulatory Visit: Payer: BC Managed Care – PPO

## 2019-05-28 DIAGNOSIS — M1000 Idiopathic gout, unspecified site: Secondary | ICD-10-CM

## 2019-05-28 DIAGNOSIS — I10 Essential (primary) hypertension: Secondary | ICD-10-CM

## 2019-05-28 DIAGNOSIS — E782 Mixed hyperlipidemia: Secondary | ICD-10-CM

## 2019-05-28 LAB — COMPREHENSIVE METABOLIC PANEL
ALT: 38 U/L (ref 0–53)
AST: 27 U/L (ref 0–37)
Albumin: 4.3 g/dL (ref 3.5–5.2)
Alkaline Phosphatase: 48 U/L (ref 39–117)
BUN: 17 mg/dL (ref 6–23)
CO2: 27 mEq/L (ref 19–32)
Calcium: 9.6 mg/dL (ref 8.4–10.5)
Chloride: 103 mEq/L (ref 96–112)
Creatinine, Ser: 1.02 mg/dL (ref 0.40–1.50)
GFR: 73.92 mL/min (ref >60.00)
Glucose, Bld: 106 mg/dL — ABNORMAL HIGH (ref 70–99)
Potassium: 4.1 mEq/L (ref 3.5–5.1)
Sodium: 139 mEq/L (ref 135–145)
Total Bilirubin: 0.6 mg/dL (ref 0.2–1.2)
Total Protein: 6.3 g/dL (ref 6.0–8.3)

## 2019-05-28 LAB — LIPID PANEL
Cholesterol: 203 mg/dL — ABNORMAL HIGH (ref 0–200)
HDL: 56 mg/dL (ref >39.00)
LDL Cholesterol: 129 mg/dL — ABNORMAL HIGH (ref 0–99)
NonHDL: 147.23
Total CHOL/HDL Ratio: 4
Triglycerides: 91 mg/dL (ref 0.0–149.0)
VLDL: 18.2 mg/dL (ref 0.0–40.0)

## 2019-05-28 LAB — CBC
HCT: 47.9 % (ref 39.0–52.0)
Hemoglobin: 16.2 g/dL (ref 13.0–17.0)
MCHC: 33.9 g/dL (ref 30.0–36.0)
MCV: 93.4 fl (ref 78.0–100.0)
Platelets: 222 10*3/uL (ref 150.0–400.0)
RBC: 5.13 Mil/uL (ref 4.22–5.81)
RDW: 13.1 % (ref 11.5–15.5)
WBC: 7 10*3/uL (ref 4.0–10.5)

## 2019-05-28 LAB — URIC ACID: Uric Acid, Serum: 5.1 mg/dL (ref 4.0–7.8)

## 2019-05-29 LAB — TSH: TSH: 2.11 u[IU]/mL (ref 0.35–4.50)

## 2019-05-29 LAB — PSA: PSA: 1.03 ng/mL (ref 0.10–4.00)

## 2019-06-09 ENCOUNTER — Other Ambulatory Visit: Payer: Self-pay | Admitting: Family Medicine

## 2019-06-11 ENCOUNTER — Other Ambulatory Visit: Payer: Self-pay

## 2019-06-11 ENCOUNTER — Encounter: Payer: Self-pay | Admitting: Family Medicine

## 2019-06-11 ENCOUNTER — Ambulatory Visit (INDEPENDENT_AMBULATORY_CARE_PROVIDER_SITE_OTHER): Payer: BC Managed Care – PPO | Admitting: Family Medicine

## 2019-06-11 VITALS — BP 139/90 | HR 79 | Temp 96.4°F | Ht 74.0 in | Wt 256.5 lb

## 2019-06-11 DIAGNOSIS — Z0000 Encounter for general adult medical examination without abnormal findings: Secondary | ICD-10-CM

## 2019-06-11 DIAGNOSIS — Z23 Encounter for immunization: Secondary | ICD-10-CM

## 2019-06-11 DIAGNOSIS — D126 Benign neoplasm of colon, unspecified: Secondary | ICD-10-CM

## 2019-06-11 MED ORDER — ALLOPURINOL 300 MG PO TABS: 300.0000 mg | ORAL_TABLET | Freq: Every day | ORAL | 3 refills | Status: DC

## 2019-06-11 MED ORDER — INDOMETHACIN 50 MG PO CAPS: ORAL_CAPSULE | ORAL | 1 refills | Status: DC

## 2019-06-11 MED ORDER — COLCHICINE 0.6 MG PO TABS: ORAL_TABLET | ORAL | 1 refills | Status: DC

## 2019-06-11 NOTE — Patient Instructions (Signed)
Preventive Care 40-62 Years Old, Male Preventive care refers to lifestyle choices and visits with your health care provider that can promote health and wellness. This includes:  A yearly physical exam. This is also called an annual well check.  Regular dental and eye exams.  Immunizations.  Screening for certain conditions.  Healthy lifestyle choices, such as eating a healthy diet, getting regular exercise, not using drugs or products that contain nicotine and tobacco, and limiting alcohol use. What can I expect for my preventive care visit? Physical exam Your health care provider will check:  Height and weight. These may be used to calculate body mass index (BMI), which is a measurement that tells if you are at a healthy weight.  Heart rate and blood pressure.  Your skin for abnormal spots. Counseling Your health care provider may ask you questions about:  Alcohol, tobacco, and drug use.  Emotional well-being.  Home and relationship well-being.  Sexual activity.  Eating habits.  Work and work environment. What immunizations do I need?  Influenza (flu) vaccine  This is recommended every year. Tetanus, diphtheria, and pertussis (Tdap) vaccine  You may need a Td booster every 10 years. Varicella (chickenpox) vaccine  You may need this vaccine if you have not already been vaccinated. Zoster (shingles) vaccine  You may need this after age 60. Measles, mumps, and rubella (MMR) vaccine  You may need at least one dose of MMR if you were born in 1957 or later. You may also need a second dose. Pneumococcal conjugate (PCV13) vaccine  You may need this if you have certain conditions and were not previously vaccinated. Pneumococcal polysaccharide (PPSV23) vaccine  You may need one or two doses if you smoke cigarettes or if you have certain conditions. Meningococcal conjugate (MenACWY) vaccine  You may need this if you have certain conditions. Hepatitis A vaccine   You may need this if you have certain conditions or if you travel or work in places where you may be exposed to hepatitis A. Hepatitis B vaccine  You may need this if you have certain conditions or if you travel or work in places where you may be exposed to hepatitis B. Haemophilus influenzae type b (Hib) vaccine  You may need this if you have certain risk factors. Human papillomavirus (HPV) vaccine  If recommended by your health care provider, you may need three doses over 6 months. You may receive vaccines as individual doses or as more than one vaccine together in one shot (combination vaccines). Talk with your health care provider about the risks and benefits of combination vaccines. What tests do I need? Blood tests  Lipid and cholesterol levels. These may be checked every 5 years, or more frequently if you are over 50 years old.  Hepatitis C test.  Hepatitis B test. Screening  Lung cancer screening. You may have this screening every year starting at age 55 if you have a 30-pack-year history of smoking and currently smoke or have quit within the past 15 years.  Prostate cancer screening. Recommendations will vary depending on your family history and other risks.  Colorectal cancer screening. All adults should have this screening starting at age 50 and continuing until age 75. Your health care provider may recommend screening at age 45 if you are at increased risk. You will have tests every 1-10 years, depending on your results and the type of screening test.  Diabetes screening. This is done by checking your blood sugar (glucose) after you have not eaten   for a while (fasting). You may have this done every 1-3 years.  Sexually transmitted disease (STD) testing. Follow these instructions at home: Eating and drinking  Eat a diet that includes fresh fruits and vegetables, whole grains, lean protein, and low-fat dairy products.  Take vitamin and mineral supplements as recommended  by your health care provider.  Do not drink alcohol if your health care provider tells you not to drink.  If you drink alcohol: ? Limit how much you have to 0-2 drinks a day. ? Be aware of how much alcohol is in your drink. In the [place] one drink equals one 12 oz bottle of beer (355 mL), one 5 oz glass of wine (148 mL), or one 1 oz glass of hard liquor (44 mL). Lifestyle  Take daily care of your teeth and gums.  Stay active. Exercise for at least 30 minutes on 5 or more days each week.  Do not use any products that contain nicotine or tobacco, such as cigarettes, e-cigarettes, and chewing tobacco. If you need help quitting, ask your health care provider.  If you are sexually active, practice safe sex. Use a condom or other form of protection to prevent STIs (sexually transmitted infections).  Talk with your health care provider about taking a low-dose aspirin every day starting at age 33. What's next?  Go to your health care provider once a year for a well check visit.  Ask your health care provider how often you should have your eyes and teeth checked.  Stay up to date on all vaccines. This information is not intended to replace advice given to you by your health care provider. Make sure you discuss any questions you have with your health care provider. Document Released: 08/13/2015 Document Revised: 07/11/2018 Document Reviewed: 07/11/2018 Elsevier Patient Education  2020 [employer].

## 2019-06-11 NOTE — Progress Notes (Signed)
Subjective:     Patient ID: Paul Morrison, male   DOB: Sep 24, 1955, 63 y.o.   MRN: [id number]  HPI  Paul Morrison is seen today for physical exam.  His chronic problems include hypertension, history of colon adenomas, hypogonadism, osteoarthritis involving several joints, gout, hyperlipidemia. He had significant difficulties over the summer with presumed gout flareups.  He described acute inflammatory type arthritis symptoms mostly involving the hands.  We were somewhat surprised as he had been taking allopurinol regularly and recent uric acid 5.1.  He started taking some natural supplements including cherry tart extract and a couple others and seemed to have improvement of symptoms after that.  Health maintenance reviewed:  -Needs flu vaccine today -Previous hepatitis C screening negative -Tetanus due 2028 -Repeat colonoscopy due 10/22 -No history of shingles vaccine.  Past Medical History:  Diagnosis Date  . Arthritis   . Cancer ([place])    skin cancer left ear- MOHS  . Hyperlipidemia   . Hypertension   . Low back pain    Past Surgical History:  Procedure Laterality Date  . COLONOSCOPY    . HERNIA REPAIR    . INJECTION KNEE     both knees  . keratatomy    . knee arthroscopy    . LUMBAR FUSION    . MOHS SURGERY    . POLYPECTOMY    . TOTAL KNEE ARTHROPLASTY Right 11/24/2016   Procedure: RIGHT TOTAL KNEE ARTHROPLASTY;  Surgeon: [name], MD;  Location: [facility];  Service: Orthopedics;  Laterality: Right;  Marland Kitchen VASECTOMY      reports that he has quit smoking. He has never used smokeless tobacco. He reports current alcohol use. He reports that he does not use drugs. family history includes Aneurysm in an other family member; Asthma in his mother; Coronary artery disease in an other family member; Hyperlipidemia in his mother; Hypertension in his mother. Allergies  Allergen Reactions  . Cephalexin Other (See Comments)    Stomach cramping   . Celecoxib Rash      Review of Systems   Constitutional: Negative for activity change, appetite change, fatigue, fever and unexpected weight change.  HENT: Negative for congestion, ear pain and trouble swallowing.   Eyes: Negative for pain and visual disturbance.  Respiratory: Negative for cough, shortness of breath and wheezing.   Cardiovascular: Negative for chest pain and palpitations.  Gastrointestinal: Negative for abdominal distention, abdominal pain, blood in stool, constipation, diarrhea, nausea, rectal pain and vomiting.  Genitourinary: Negative for dysuria, hematuria and testicular pain.  Musculoskeletal: Positive for arthralgias. Negative for joint swelling.  Skin: Negative for rash.  Neurological: Negative for dizziness, syncope and headaches.  Hematological: Negative for adenopathy.  Psychiatric/Behavioral: Negative for confusion and dysphoric mood.       Objective:   Physical Exam Constitutional:      General: He is not in acute distress.    Appearance: He is well-developed.  HENT:     Head: Normocephalic and atraumatic.     Right Ear: Tympanic membrane and external ear normal.     Left Ear: Tympanic membrane and external ear normal.  Eyes:     Conjunctiva/sclera: Conjunctivae normal.     Pupils: Pupils are equal, round, and reactive to light.  Neck:     Musculoskeletal: Normal range of motion and neck supple.     Thyroid: No thyromegaly.  Cardiovascular:     Rate and Rhythm: Normal rate and regular rhythm.     Heart sounds: Normal heart sounds. No murmur.  Pulmonary:     Effort: No respiratory distress.     Breath sounds: No wheezing or rales.  Abdominal:     General: Bowel sounds are normal. There is no distension.     Palpations: Abdomen is soft. There is no mass.     Tenderness: There is no abdominal tenderness. There is no guarding or rebound.  Musculoskeletal:     Right lower leg: No edema.     Left lower leg: No edema.  Lymphadenopathy:     Cervical: No cervical adenopathy.  Skin:     Findings: No rash.  Neurological:     Mental Status: He is alert and oriented to person, place, and time.     Cranial Nerves: No cranial nerve deficit.  Psychiatric:        Mood and Affect: Mood normal.        Assessment:     Physical exam.  Labs reviewed with patient with no major concerns.  We discussed the following health maintenance issues    Plan:     -Monitor for repeat colonoscopy in a couple years -Flu vaccine given -Discussed Shingrix vaccine and reviewed potential side effects.  He wishes to proceed.  He is reminded that he needs booster in 2 to 6 months -If strongly advocated try to lose some weight -Refilled several medications including colchicine for as needed use, allopurinol, and Indocin for as needed use  Eulas Post MD [facility]

## 2019-06-17 ENCOUNTER — Telehealth: Payer: Self-pay

## 2019-06-17 ENCOUNTER — Other Ambulatory Visit: Payer: Self-pay | Admitting: Family Medicine

## 2019-06-17 NOTE — Telephone Encounter (Signed)
Please see message. I do not see this on his current med list.  Copied from [place] [id number]. Topic: General - Other >> Jun 17, 2019  1:55 PM Yvette Rack wrote: Reason for CRM: Pt stated he will be going to the dentist and requests a Rx for Amoxicillin 500 MG to be sent to [facility]

## 2019-06-17 NOTE — Telephone Encounter (Signed)
Amoxicillin 500 mg take 4 tablets one hour prior to dental procedure.  Disp #4.

## 2019-06-18 ENCOUNTER — Other Ambulatory Visit: Payer: Self-pay

## 2019-06-18 MED ORDER — AMOXICILLIN 500 MG PO CAPS: ORAL_CAPSULE | ORAL | 0 refills | Status: DC

## 2019-06-18 NOTE — Telephone Encounter (Signed)
This has been sent to the pharmacy for the patient.

## 2019-06-20 NOTE — Telephone Encounter (Signed)
Patient calling to check on refill. States that he is completely out of this medication. Aware of refill policy.

## 2019-06-20 NOTE — Telephone Encounter (Signed)
See patient refill request

## 2019-07-03 ENCOUNTER — Telehealth: Payer: Self-pay | Admitting: Family Medicine

## 2019-07-03 NOTE — Telephone Encounter (Signed)
Prior Auth required for colchicine 0.6 MG tablet per [employer]. Please call: [phone] to complete

## 2019-07-04 NOTE — Telephone Encounter (Signed)
See note

## 2019-07-04 NOTE — Telephone Encounter (Signed)
OK to proceed

## 2019-07-04 NOTE — Telephone Encounter (Signed)
[employer] called back in to follow up, they are stating that a form has been completed however it is not the correct form that is needed for this medication. They will fax the correct form to have updated.

## 2019-07-04 NOTE — Telephone Encounter (Signed)
Forms placed in red folder. Please return when complete. Thank you.

## 2019-07-04 NOTE — Telephone Encounter (Signed)
Yes   I think I have already filled out some paperwork in regard to that.

## 2019-07-09 ENCOUNTER — Telehealth: Payer: Self-pay

## 2019-07-09 ENCOUNTER — Other Ambulatory Visit: Payer: Self-pay

## 2019-07-09 MED ORDER — COLCHICINE 0.6 MG PO CAPS: ORAL_CAPSULE | ORAL | 2 refills | Status: DC

## 2019-07-09 NOTE — Telephone Encounter (Signed)
Called patient and let him know that his insurance will not cover the [place] but it will cover the [place] and I have sent this in to the [facility] for the [place] 0.6mg . Patient verbalized an understanding.

## 2019-07-10 ENCOUNTER — Other Ambulatory Visit: Payer: Self-pay | Admitting: Family Medicine

## 2019-08-07 ENCOUNTER — Encounter: Payer: Self-pay | Admitting: Family Medicine

## 2019-08-08 ENCOUNTER — Other Ambulatory Visit: Payer: Self-pay

## 2019-08-08 MED ORDER — MITIGARE 0.6 MG PO CAPS: 0.6000 | ORAL_CAPSULE | Freq: Two times a day (BID) | ORAL | 2 refills | Status: DC | PRN

## 2019-08-21 ENCOUNTER — Other Ambulatory Visit: Payer: Self-pay | Admitting: Neurosurgery

## 2019-08-21 DIAGNOSIS — M48061 Spinal stenosis, lumbar region without neurogenic claudication: Secondary | ICD-10-CM

## 2019-09-09 ENCOUNTER — Other Ambulatory Visit: Payer: Self-pay

## 2019-09-10 ENCOUNTER — Encounter: Payer: Self-pay | Admitting: Family Medicine

## 2019-09-10 ENCOUNTER — Other Ambulatory Visit: Payer: Self-pay

## 2019-09-10 ENCOUNTER — Ambulatory Visit (INDEPENDENT_AMBULATORY_CARE_PROVIDER_SITE_OTHER): Payer: BC Managed Care – PPO | Admitting: Family Medicine

## 2019-09-10 VITALS — BP 118/78 | HR 76 | Temp 97.1°F | Ht 74.0 in | Wt 262.2 lb

## 2019-09-10 DIAGNOSIS — M19042 Primary osteoarthritis, left hand: Secondary | ICD-10-CM

## 2019-09-10 DIAGNOSIS — M1000 Idiopathic gout, unspecified site: Secondary | ICD-10-CM | POA: Diagnosis not present

## 2019-09-10 DIAGNOSIS — I809 Phlebitis and thrombophlebitis of unspecified site: Secondary | ICD-10-CM

## 2019-09-10 DIAGNOSIS — M19041 Primary osteoarthritis, right hand: Secondary | ICD-10-CM | POA: Diagnosis not present

## 2019-09-10 LAB — URIC ACID: Uric Acid, Serum: 5.4 mg/dL (ref 4.0–7.8)

## 2019-09-10 MED ORDER — PREDNISONE 10 MG PO TABS: ORAL_TABLET | ORAL | 0 refills | Status: DC

## 2019-09-10 MED ORDER — COLCHICINE 0.6 MG PO TABS: ORAL_TABLET | ORAL | 3 refills | Status: DC

## 2019-09-10 NOTE — Patient Instructions (Signed)
Phlebitis Phlebitis is soreness and swelling (inflammation) of a vein. This can occur in your arms, legs, or torso (trunk), as well as deeper inside your body. Phlebitis is usually not serious when it occurs close to the surface of the body. However, it can cause serious problems when it occurs deeper inside the body. What are the causes? Phlebitis can be caused by:  Having a needle, IV tube, or long, thin tube (catheter) put in the vein.  Getting certain medicines or solutions through an IV tube. Some medicines or solutions, such as antibiotics and cancer medicines, can irritate the vein.  Having an IV tube for a long period of time.  Having an IV placed on the part of the arm or hand that moves a lot.  A blood clot.  Infection of the vein.  Surgery on a vein. What increases the risk? The following factors may make you more likely to develop this condition:  Being overweight or obese.  Pregnancy.  Cancer.  Reduced or slowing of blood flow through your veins. This can be caused by: ? Bed rest over a long period of time. ? Long distance travel. ? Injury. ? Surgery. ? Congestive heart failure. ? Inactivity.  Smoking.  Taking birth control pills or hormone replacement therapy.  Varicose veins.  Inflammatory diseases or blood disorders that increase clotting.  Taking illegal drugs through the vein.  Having a history of blood clots. What are the signs or symptoms? Symptoms of this condition include:  A red, tender, swollen, and painful area on your skin. Usually, the area is long and narrow, and may spread.  The affected area feeling warm to the touch.  Firmness along the center of the affected area.  Low fever. How is this diagnosed? This condition is diagnosed based on:  Your symptoms.  A physical exam.  Your medical history, including your family history. Other tests may be done to rule out other conditions, such as blood clots, especially if you have a  history of blood clots or are at higher risk of developing blood clots. These may include:  Blood tests.  Ultrasound exam.  Genetic tests.  Biopsy. This is when a tissue is taken from the body for examination. This is rare. How is this treated? Treatment depends on the severity of the condition as well as the location of the inflammation. In most cases, the condition is minor and gets better quickly. Treatment may include:  Applying a warm compress or heating pad.  Wearing compression stockings or bandages.  Medicines, such as: ? Anti-inflammatory medicines. ? Antibiotic medicines if an infection is present. ? Blood-thinning medicines if a blood clot is suspected or present, or if you have a history of blood clots or a blood disorder.  Removing an IV tube that may be causing the problem.  Using a different medicine or solution that will not irritate the vein. In rare cases, surgery may be needed to remove a damaged section of a vein. Follow these instructions at home: Managing pain, stiffness, and swelling  If directed, apply heat to the affected area as often as told by your health care provider. Use the heat source that your health care provider recommends, such as a moist heat pack or a heating pad. ? Place a towel between your skin and the heat source. ? Leave the heat on for 20-30 minutes. ? Remove the heat if your skin turns bright red. This is especially important if you are unable to feel pain, heat,   or cold. You may have a greater risk of getting burned.  Raise (elevate) the affected area above the level of your heart while you are sitting or lying down. Medicines  Take over-the-counter and prescription medicines only as told by your health care provider.  If you were prescribed an antibiotic, take it as told by your health care provider. Do not stop using the antibiotic even if you start to feel better.  Carry a medical alert card or wear your medical alert jewelry  to show that you are on blood thinners, if this applies. General instructions   If you have phlebitis in your legs: ? Avoid sitting or standing for a long time. ? Keep your legs moving. ? Try to take short walks to break up long periods of sitting. ? Try to avoid bed rest that lasts for long periods of time. Regular sleep is not part of bed rest.  Wear compression stockings as told by your health care provider. These stockings reduce swelling in your legs and help to prevent blood clots. They also help to prevent the condition from coming back.  Do not use any products that contain nicotine or tobacco, such as cigarettes and e-cigarettes. If you need help quitting, ask your health care provider.  Keep all follow-up visits as told by your health care provider. This is important. This may include any follow-up blood tests. Contact a health care provider if:  You have unusual bruising or any bleeding problems.  Your symptoms do not get better, or your symptoms get worse.  You are on anti-inflammatory medicines and you develop abdominal pain. Get help right away if:  You suddenly have chest pain or trouble breathing.  You have a fever and your symptoms suddenly get worse.  You cough up blood.  You feel lightheaded or pass out.  You have severe pain and swelling in the affected arm or leg. These symptoms may represent a serious problem that is an emergency. Do not wait to see if the symptoms will go away. Get medical help right away. Call your local emergency services (911 in the [place] Do not drive yourself to the hospital. Summary  Phlebitis is soreness and swelling (inflammation) of a vein.  Phlebitis is usually not serious when it occurs close to the surface of the body. However, it can cause serious problems when it occurs deeper inside the body.  Treatment depends on the severity of the condition and the location of the inflammation.  Raise (elevate) the affected area above  the level of your heart while you are sitting or lying down. This information is not intended to replace advice given to you by your health care provider. Make sure you discuss any questions you have with your health care provider. Document Revised: 08/27/2018 Document Reviewed: 08/22/2016 Elsevier Patient Education  2020 Elsevier Inc.  

## 2019-09-10 NOTE — Progress Notes (Signed)
Subjective:     Patient ID: Paul Morrison, male   DOB: 04-23-1956, 64 y.o.   MRN: [id number]  HPI Paul Morrison is seen for the following issues  Acute issue of some swelling and painful veins dorsum left hand.  He first noticed this about a week ago.  There is no recent reported hand injury.  He is not noted any redness.  No recent IVs.  No fevers or chills.  He has history of gout.  He is having bilateral hand pain mostly DIP and PIP joints.  He has had tophaceous issues in the past but no current tophaceous changes of the hands.  We have started allopurinol for his gout last year after multiple flareups and also some tophaceous changes of the right ear.  His last uric acid of 5.1.  He is taking allopurinol regularly.  He wants to recheck uric acid level today.  Also requesting refills of colchicine and prednisone to have on hand if he has acute flares.  Past Medical History:  Diagnosis Date  . Arthritis   . Cancer ([place])    skin cancer left ear- MOHS  . Hyperlipidemia   . Hypertension   . Low back pain    Past Surgical History:  Procedure Laterality Date  . COLONOSCOPY    . HERNIA REPAIR    . INJECTION KNEE     both knees  . keratatomy    . knee arthroscopy    . LUMBAR FUSION    . MOHS SURGERY    . POLYPECTOMY    . TOTAL KNEE ARTHROPLASTY Right 11/24/2016   Procedure: RIGHT TOTAL KNEE ARTHROPLASTY;  Surgeon: [name], MD;  Location: [facility];  Service: Orthopedics;  Laterality: Right;  Marland Kitchen VASECTOMY      reports that he has quit smoking. He has never used smokeless tobacco. He reports current alcohol use. He reports that he does not use drugs. family history includes Aneurysm in an other family member; Asthma in his mother; Coronary artery disease in an other family member; Hyperlipidemia in his mother; Hypertension in his mother. Allergies  Allergen Reactions  . Cephalexin Other (See Comments)    Stomach cramping   . Celecoxib Rash     Review of Systems  Constitutional:  Negative for chills and fever.  Respiratory: Negative for shortness of breath.   Cardiovascular: Negative for chest pain and leg swelling.  Musculoskeletal: Positive for arthralgias.  Skin: Negative for rash.       Objective:   Physical Exam Vitals reviewed.  Constitutional:      Appearance: Normal appearance.  Cardiovascular:     Rate and Rhythm: Normal rate and regular rhythm.  Pulmonary:     Effort: Pulmonary effort is normal.     Breath sounds: Normal breath sounds.  Musculoskeletal:     Comments: Left hand examined.  He does have somewhat prominent slightly firm to palpation and slightly tender vein dorsum of left hand.  This is superficial vein.  No overlying skin changes.  No erythema.  No appreciated warmth.  He does not have any generalized edema of the left upper extremity.  He does have nodular changes multiple digits PIP and DIP joints consistent with osteoarthritis.  No obvious tophaceous changes  Neurological:     Mental Status: He is alert.        Assessment:     #1 probable superficial phlebitis left hand.  No clinical evidence to suggest DVT  #2 history of recurrent gout.  Recent uric acid  at goal.  Patient now on prophylaxis with allopurinol  #3 bilateral hand pain.  Suspect predominantly osteoarthritis changes.    Plan:     -Discussed applying topical heat and consider aspirin 325 mg once or twice daily for the next few days for his superficial phlebitis symptoms  -Refilled his prednisone to have on hand for acute exacerbation of gout and refill colchicine to continue 0.6 mg twice daily as needed for acute flare of gout  -Recheck uric acid level with goal less than 6  -We explained there is no cure for his osteoarthritis of the fingers.  He has some topical diclofenac which he uses occasionally.  Eulas Post MD [facility] at [facility]

## 2019-09-11 ENCOUNTER — Other Ambulatory Visit (HOSPITAL_COMMUNITY): Payer: Self-pay | Admitting: Physician Assistant

## 2019-09-11 ENCOUNTER — Other Ambulatory Visit: Payer: Self-pay | Admitting: Physician Assistant

## 2019-09-11 DIAGNOSIS — Z96651 Presence of right artificial knee joint: Secondary | ICD-10-CM

## 2019-09-17 ENCOUNTER — Ambulatory Visit
Admission: RE | Admit: 2019-09-17 | Discharge: 2019-09-17 | Disposition: A | Discharge status: Home / Self Care | Payer: BC Managed Care – PPO | Source: Ambulatory Visit | Attending: Neurosurgery | Admitting: Neurosurgery

## 2019-09-17 ENCOUNTER — Other Ambulatory Visit: Payer: Self-pay

## 2019-09-17 DIAGNOSIS — M48061 Spinal stenosis, lumbar region without neurogenic claudication: Secondary | ICD-10-CM

## 2019-09-17 MED ORDER — GADOBENATE DIMEGLUMINE 529 MG/ML IV SOLN
20.0000 mL | Freq: Once | INTRAVENOUS | Status: AC | PRN
  Administered 2019-09-17: 20 mL via INTRAVENOUS

## 2019-09-24 ENCOUNTER — Other Ambulatory Visit: Payer: Self-pay

## 2019-09-24 ENCOUNTER — Encounter (HOSPITAL_COMMUNITY)
Admission: RE | Admit: 2019-09-24 | Discharge: 2019-09-24 | Disposition: A | Discharge status: Home / Self Care | Payer: BC Managed Care – PPO | Source: Ambulatory Visit | Attending: Physician Assistant | Admitting: Physician Assistant

## 2019-09-24 DIAGNOSIS — Z96651 Presence of right artificial knee joint: Secondary | ICD-10-CM | POA: Diagnosis present

## 2019-09-24 MED ORDER — TECHNETIUM TC 99M MEDRONATE IV KIT
19.9000 | PACK | Freq: Once | INTRAVENOUS | Status: AC | PRN
  Administered 2019-09-24: 19.9 via INTRAVENOUS

## 2019-10-07 ENCOUNTER — Encounter: Payer: Self-pay | Admitting: Family Medicine

## 2019-10-07 MED ORDER — HYDROCODONE-ACETAMINOPHEN 5-325 MG PO TABS: 1.0000 | ORAL_TABLET | ORAL | 0 refills | Status: DC | PRN

## 2019-10-07 NOTE — Telephone Encounter (Signed)
[employer] only allow maximum of 5 days of opioid use for pain that is not designated as chronic.  If he is looking at chronic pain management we would need to look at referral to chronic pain management center.  Otherwise we restricted to 5 days of use for opioids I did give refill of the Vicodin

## 2019-11-25 ENCOUNTER — Encounter: Payer: Self-pay | Admitting: Family Medicine

## 2019-11-25 DIAGNOSIS — E8881 Metabolic syndrome: Secondary | ICD-10-CM

## 2019-11-25 DIAGNOSIS — M1000 Idiopathic gout, unspecified site: Secondary | ICD-10-CM

## 2019-11-26 ENCOUNTER — Other Ambulatory Visit (INDEPENDENT_AMBULATORY_CARE_PROVIDER_SITE_OTHER): Payer: BC Managed Care – PPO

## 2019-11-26 DIAGNOSIS — M1000 Idiopathic gout, unspecified site: Secondary | ICD-10-CM

## 2019-11-26 LAB — URIC ACID: Uric Acid, Serum: 5 mg/dL (ref 4.0–7.8)

## 2019-12-31 ENCOUNTER — Other Ambulatory Visit: Payer: Self-pay

## 2019-12-31 ENCOUNTER — Encounter: Payer: BC Managed Care – PPO | Attending: Family Medicine | Admitting: Skilled Nursing Facility1

## 2019-12-31 DIAGNOSIS — E669 Obesity, unspecified: Secondary | ICD-10-CM | POA: Diagnosis not present

## 2019-12-31 NOTE — Progress Notes (Signed)
  Assessment:  Primary concerns today: Gout.    Supplements taken: Tart cherry + tumeric, curcumin, aged garlic, vegan protein, marine collagen, psyllium, magnesium, hemp oil  Pt states 2 years ago went to a wholistic doctor which put him on a no dairy no gluten diet + many supplements and pellets of testosterone with a 30 pound weight loss. Pt states he has gout now.  Pt states he took himself off that diet realizing it was too much protein. Pt states he used to ride his bike 60 miles. Pt states since his covid shot he has had low energy but getting better. Pt states he wants to lose weight. Pt states he has an afternoon low energy. Pt states he does have low testosterone. 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 Pt states he does have a hearing aid and have advanced arthritis. Pt answered his phone during the appt. Pt states he wants a healthy diet based off of sciences and to not take so many supplements. Pt states he believes a gluten free diet with no dairy results in weight loss: Dietitian educated the pt on the macronutrient distribution not being the only reason for weight loss, the research indicates the deficit of calories being the main driver for weight loss: Pt responded I hear you but I do not think that is true.   MEDICATIONS: see list   DIETARY INTAKE:  Usual eating pattern includes 3 meals and 3+ snacks per day.  Everyday foods include 3.  Avoided foods include none stated.    24-hr recall:  B ( AM): pea/hemp protein powder and marine collagen in ripple milk + banana and nuts and coconut + psyllium  Snk ( AM):  L ( PM): chicken mole with rice and kombucha  Snk ( PM):  D ( PM): tomatoes with gluten free bread crumbs and duck breast with corn on the cob Snk ( PM):  Beverages: 6 cups  black coffee, tart cherry juice, kombucha,  white wine, spirit, water  Usual physical activity: riding bike 40-50 miles in one day (100 miles a week)  Estimated energy needs: 2000 calories 225 g carbohydrates 150 g protein 56 g fat  Progress Towards Goal(s):  In progress.    Intervention:  Nutrition counseling for a healthy diet based in weight management.  -Aim for 60 ounces of water per day -Have 1.5 cups non starchy vegetables 2 times a day 7 days a week -Lean on plant based protein such as beans, lentils, soy, pea, etc -Stop taking curcumin and collagen  -Do not drink alcohol: wine, beer, spririt -Stop snacking at night when you are bored and not hungry  Teaching Method Utilized:  Visual Auditory Hands on  Handouts given during visit include:  Meal ideas sheet  Barriers to learning/adherence to lifestyle change: none identified   Demonstrated degree of understanding via:  Teach Back   Monitoring/Evaluation:  Dietary intake, exercise, and body weight prn.

## 2020-01-16 ENCOUNTER — Other Ambulatory Visit: Payer: Self-pay | Admitting: Family Medicine

## 2020-01-16 ENCOUNTER — Telehealth: Payer: Self-pay | Admitting: Family Medicine

## 2020-01-16 NOTE — Telephone Encounter (Signed)
Please advise if okay to fill  

## 2020-01-16 NOTE — Telephone Encounter (Signed)
Wanted to touch base on the patient's Amoxicillin that was denied.

## 2020-01-17 NOTE — Telephone Encounter (Signed)
OK to refill.   Looks like he is taking this prior to dental procedures.

## 2020-01-19 MED ORDER — AMOXICILLIN 500 MG PO CAPS: ORAL_CAPSULE | ORAL | 0 refills | Status: DC

## 2020-01-19 NOTE — Telephone Encounter (Signed)
Refill sent in

## 2020-02-06 ENCOUNTER — Encounter: Payer: Self-pay | Admitting: Family Medicine

## 2020-02-06 ENCOUNTER — Telehealth (INDEPENDENT_AMBULATORY_CARE_PROVIDER_SITE_OTHER): Payer: BC Managed Care – PPO | Admitting: Family Medicine

## 2020-02-06 DIAGNOSIS — J029 Acute pharyngitis, unspecified: Secondary | ICD-10-CM | POA: Diagnosis not present

## 2020-02-06 NOTE — Progress Notes (Signed)
Patient ID: Paul Morrison, male   DOB: 1956-03-21, 64 y.o.   MRN: [id number]  This visit type was conducted due to national recommendations for restrictions regarding the COVID-19 pandemic in an effort to limit this patient's exposure and mitigate transmission in our community.   Virtual Visit via Video Note  I connected with Paul Morrison on 02/06/20 at  9:15 AM EDT by a video enabled telemedicine application and verified that I am speaking with the correct person using two identifiers.  Location patient: home Location provider:work or home office Persons participating in the virtual visit: patient, provider  I discussed the limitations of evaluation and management by telemedicine and the availability of in person appointments. The patient expressed understanding and agreed to proceed.   HPI: [name] relates onset about 3 or 4 days ago of "scratchy "throat.  He had some mild cough.  He had some initial postnasal drip which seems to be improving.  His main symptom now is sore throat.  No fever.  No sick contacts.  Using over-the-counter Mucinex or similar product.  Denies any major facial pain.  No major headaches.  He has had Covid vaccine.  No significant myalgias.   ROS: See pertinent positives and negatives per HPI.  Past Medical History:  Diagnosis Date  . Arthritis   . Cancer ([name])    skin cancer left ear- MOHS  . Hyperlipidemia   . Hypertension   . Low back pain     Past Surgical History:  Procedure Laterality Date  . COLONOSCOPY    . HERNIA REPAIR    . INJECTION KNEE     both knees  . keratatomy    . knee arthroscopy    . LUMBAR FUSION    . MOHS SURGERY    . POLYPECTOMY    . TOTAL KNEE ARTHROPLASTY Right 11/24/2016   Procedure: RIGHT TOTAL KNEE ARTHROPLASTY;  Surgeon: [name], MD;  Location: [facility];  Service: Orthopedics;  Laterality: Right;  Marland Kitchen VASECTOMY      Family History  Problem Relation Age of Onset  . Hyperlipidemia Mother   . Hypertension Mother   .  Asthma Mother   . Coronary artery disease Other   . Aneurysm Other   . Colon cancer Neg Hx   . Colon polyps Neg Hx   . Esophageal cancer Neg Hx   . Rectal cancer Neg Hx   . Stomach cancer Neg Hx     SOCIAL HX: non-smoker   Current Outpatient Medications:  .  allopurinol (ZYLOPRIM) 300 MG tablet, Take 1 tablet (300 mg total) by mouth daily., Disp: 90 tablet, Rfl: 3 .  ALPRAZolam (XANAX) 0.5 MG tablet, Take 1 tablet (0.5 mg total) by mouth 3 (three) times daily as needed for anxiety. (Patient not taking: Reported on 09/10/2019), Disp: 30 tablet, Rfl: 0 .  amoxicillin (AMOXIL) 500 MG capsule, Take 4 tablets one hour prior to dental procedure., Disp: 4 capsule, Rfl: 0 .  atorvastatin (LIPITOR) 40 MG tablet, TAKE ONE TABLET DAILY, Disp: 90 tablet, Rfl: 1 .  colchicine 0.6 MG tablet, Take one tablet twice daily as needed for gout flare, Disp: 60 tablet, Rfl: 3 .  diclofenac Sodium (VOLTAREN) 1 % GEL, Voltaren 1 % topical gel  APPLY 4 GRAMS TO THE AFFECTED AREA(S) BY TOPICAL ROUTE 4 TIMES PER DAY, Disp: , Rfl:  .  Glucosamine-Chondroitin (COSAMIN DS PO), Take 1 capsule by mouth daily as needed (pain)., Disp: , Rfl:  .  HYDROcodone-acetaminophen (NORCO/VICODIN) 5-325 MG tablet,  Take 1-2 tablets by mouth every 4 (four) hours as needed for moderate pain. One po q 6 hours prn pain.  May refill on or after 06-25-17, Disp: 30 tablet, Rfl: 0 .  indomethacin (INDOCIN) 50 MG capsule, TAKE ONE (1) CAPSULE THREE (3) TIMES EACH DAY AS NEEDED, Disp: 30 capsule, Rfl: 1 .  losartan (COZAAR) 100 MG tablet, TAKE ONE TABLET DAILY, Disp: 90 tablet, Rfl: 3 .  losartan (COZAAR) 50 MG tablet, TAKE TWO TABLETS EVERY DAY (Patient not taking: Reported on 09/10/2019), Disp: 60 tablet, Rfl: 0 .  mupirocin ointment (BACTROBAN) 2 %, Apply intranasal bid for 5 days. (Patient not taking: Reported on 09/10/2019), Disp: 22 g, Rfl: 1 .  predniSONE (DELTASONE) 10 MG tablet, Taper as follows: 4-4-4-4-3-3-3-2-2-1-1, Disp: 31 tablet,  Rfl: 0 .  sildenafil (REVATIO) 20 MG tablet, Take 2 to 5 tablets one hour prior to sexual activity, Disp: 50 tablet, Rfl: 5  Current Facility-Administered Medications:  .  0.9 %  sodium chloride infusion, 500 mL, Intravenous, Once, [name], [name], MD  EXAM:  VITALS per patient if applicable:  GENERAL: alert, oriented, appears well and in no acute distress  HEENT: atraumatic, conjunttiva clear, no obvious abnormalities on inspection of external nose and ears  NECK: normal movements of the head and neck  LUNGS: on inspection no signs of respiratory distress, breathing rate appears normal, no obvious gross SOB, gasping or wheezing  CV: no obvious cyanosis  MS: moves all visible extremities without noticeable abnormality  PSYCH/NEURO: pleasant and cooperative, no obvious depression or anxiety, speech and thought processing grossly intact  ASSESSMENT AND PLAN:  Discussed the following assessment and plan:  Sore throat symptoms for about 4 days.  He does not have any significant adenopathy, fever, exudate, or other predictors of high likelihood of strep throat.  Suspect probably viral  -Symptomatic treatment with salt water gargles and plenty of fluids -Continue over-the-counter Mucinex -Consider over-the-counter Aleve 1 to 2 tablets every 12 hours as needed -Follow-up promptly for any fever or symptoms not improving over the next few days     I discussed the assessment and treatment plan with the patient. The patient was provided an opportunity to ask questions and all were answered. The patient agreed with the plan and demonstrated an understanding of the instructions.   The patient was advised to call back or seek an in-person evaluation if the symptoms worsen or if the condition fails to improve as anticipated.     [name], MD

## 2020-02-11 ENCOUNTER — Encounter: Payer: Self-pay | Admitting: Family Medicine

## 2020-02-11 MED ORDER — HYDROCODONE-ACETAMINOPHEN 5-325 MG PO TABS: 1.0000 | ORAL_TABLET | ORAL | 0 refills | Status: DC | PRN

## 2020-02-11 NOTE — Telephone Encounter (Signed)
rx sent

## 2020-03-11 ENCOUNTER — Other Ambulatory Visit: Payer: Self-pay | Admitting: Family Medicine

## 2020-03-11 ENCOUNTER — Encounter: Payer: Self-pay | Admitting: Family Medicine

## 2020-03-11 DIAGNOSIS — Z0000 Encounter for general adult medical examination without abnormal findings: Secondary | ICD-10-CM

## 2020-03-12 ENCOUNTER — Encounter: Payer: Self-pay | Admitting: Family Medicine

## 2020-03-24 ENCOUNTER — Other Ambulatory Visit (INDEPENDENT_AMBULATORY_CARE_PROVIDER_SITE_OTHER): Payer: BC Managed Care – PPO

## 2020-03-24 DIAGNOSIS — Z0000 Encounter for general adult medical examination without abnormal findings: Secondary | ICD-10-CM

## 2020-03-24 LAB — CBC WITH DIFFERENTIAL/PLATELET
Basophils Absolute: 0 10*3/uL (ref 0.0–0.1)
Basophils Relative: 0.6 % (ref 0.0–3.0)
Eosinophils Absolute: 0.1 10*3/uL (ref 0.0–0.7)
Eosinophils Relative: 2.1 % (ref 0.0–5.0)
HCT: 47.3 % (ref 39.0–52.0)
Hemoglobin: 16 g/dL (ref 13.0–17.0)
Lymphocytes Relative: 24.3 % (ref 12.0–46.0)
Lymphs Abs: 1.5 10*3/uL (ref 0.7–4.0)
MCHC: 33.8 g/dL (ref 30.0–36.0)
MCV: 93.8 fl (ref 78.0–100.0)
Monocytes Absolute: 0.6 10*3/uL (ref 0.1–1.0)
Monocytes Relative: 9.6 % (ref 3.0–12.0)
Neutro Abs: 4 10*3/uL (ref 1.4–7.7)
Neutrophils Relative %: 63.4 % (ref 43.0–77.0)
Platelets: 164 10*3/uL (ref 150.0–400.0)
RBC: 5.04 Mil/uL (ref 4.22–5.81)
RDW: 13.8 % (ref 11.5–15.5)
WBC: 6.3 10*3/uL (ref 4.0–10.5)

## 2020-03-24 LAB — COMPREHENSIVE METABOLIC PANEL
ALT: 41 U/L (ref 0–53)
AST: 25 U/L (ref 0–37)
Albumin: 4.3 g/dL (ref 3.5–5.2)
Alkaline Phosphatase: 47 U/L (ref 39–117)
BUN: 18 mg/dL (ref 6–23)
CO2: 27 mEq/L (ref 19–32)
Calcium: 9.6 mg/dL (ref 8.4–10.5)
Chloride: 104 mEq/L (ref 96–112)
Creatinine, Ser: 1.04 mg/dL (ref 0.40–1.50)
GFR: 72.09 mL/min (ref >60.00)
Glucose, Bld: 103 mg/dL — ABNORMAL HIGH (ref 70–99)
Potassium: 4.4 mEq/L (ref 3.5–5.1)
Sodium: 138 mEq/L (ref 135–145)
Total Bilirubin: 0.6 mg/dL (ref 0.2–1.2)
Total Protein: 6.4 g/dL (ref 6.0–8.3)

## 2020-03-24 LAB — URIC ACID: Uric Acid, Serum: 5.4 mg/dL (ref 4.0–7.8)

## 2020-03-24 LAB — LIPID PANEL
Cholesterol: 206 mg/dL — ABNORMAL HIGH (ref 0–200)
HDL: 52.8 mg/dL (ref >39.00)
LDL Cholesterol: 126 mg/dL — ABNORMAL HIGH (ref 0–99)
NonHDL: 153.05
Total CHOL/HDL Ratio: 4
Triglycerides: 137 mg/dL (ref 0.0–149.0)
VLDL: 27.4 mg/dL (ref 0.0–40.0)

## 2020-03-24 LAB — TESTOSTERONE: Testosterone: 378.72 ng/dL (ref 300.00–890.00)

## 2020-03-24 LAB — PSA: PSA: 1.07 ng/mL (ref 0.10–4.00)

## 2020-03-24 LAB — TSH: TSH: 3.29 u[IU]/mL (ref 0.35–4.50)

## 2020-03-24 NOTE — Addendum Note (Signed)
Addended by: Jacob Moores on: 03/24/2020 08:11 AM   Modules accepted: Orders

## 2020-05-21 ENCOUNTER — Encounter: Payer: Self-pay | Admitting: Family Medicine

## 2020-05-29 ENCOUNTER — Ambulatory Visit: Payer: BC Managed Care – PPO | Attending: Internal Medicine

## 2020-05-29 DIAGNOSIS — Z23 Encounter for immunization: Secondary | ICD-10-CM

## 2020-05-29 NOTE — Progress Notes (Signed)
   Covid-19 Vaccination Clinic  Name:  TREYVION DURKEE    MRN: [id number] DOB: 05-03-1956  05/29/2020  Mr. Antillon was observed post Covid-19 immunization for 15 minutes without incident. He was provided with Vaccine Information Sheet and instruction to access the V-Safe system.   Mr. Golphin was instructed to call 911 with any severe reactions post vaccine: Marland Kitchen Difficulty breathing  . Swelling of face and throat  . A fast heartbeat  . A bad rash all over body  . Dizziness and weakness

## 2020-06-17 ENCOUNTER — Other Ambulatory Visit: Payer: Self-pay

## 2020-06-17 ENCOUNTER — Ambulatory Visit (INDEPENDENT_AMBULATORY_CARE_PROVIDER_SITE_OTHER): Payer: BC Managed Care – PPO | Admitting: *Deleted

## 2020-06-17 DIAGNOSIS — Z23 Encounter for immunization: Secondary | ICD-10-CM | POA: Diagnosis not present

## 2020-06-30 ENCOUNTER — Other Ambulatory Visit: Payer: Self-pay | Admitting: Family Medicine

## 2020-07-19 ENCOUNTER — Encounter: Payer: Self-pay | Admitting: Family Medicine

## 2020-07-19 MED ORDER — HYDROCODONE-ACETAMINOPHEN 5-325 MG PO TABS
1.0000 | ORAL_TABLET | ORAL | 0 refills | Status: DC | PRN
Start: 2020-07-19 — End: 2020-12-15

## 2020-07-21 ENCOUNTER — Other Ambulatory Visit: Payer: Self-pay | Admitting: Family Medicine

## 2020-08-27 ENCOUNTER — Other Ambulatory Visit: Payer: Self-pay | Admitting: Family Medicine

## 2020-09-24 ENCOUNTER — Other Ambulatory Visit: Payer: Self-pay | Admitting: Family Medicine

## 2020-10-15 ENCOUNTER — Other Ambulatory Visit: Payer: Self-pay | Admitting: Family Medicine

## 2020-11-08 ENCOUNTER — Other Ambulatory Visit: Payer: Self-pay | Admitting: Family Medicine

## 2020-12-07 ENCOUNTER — Encounter: Payer: Self-pay | Admitting: Family Medicine

## 2020-12-07 DIAGNOSIS — M1A9XX0 Chronic gout, unspecified, without tophus (tophi): Secondary | ICD-10-CM

## 2020-12-07 DIAGNOSIS — I10 Essential (primary) hypertension: Secondary | ICD-10-CM

## 2020-12-07 DIAGNOSIS — R7989 Other specified abnormal findings of blood chemistry: Secondary | ICD-10-CM

## 2020-12-07 DIAGNOSIS — E785 Hyperlipidemia, unspecified: Secondary | ICD-10-CM

## 2020-12-08 NOTE — Telephone Encounter (Signed)
Labs have been ordered.  Can go to [facility].

## 2020-12-10 ENCOUNTER — Other Ambulatory Visit: Payer: BC Managed Care – PPO

## 2020-12-10 DIAGNOSIS — M1A9XX0 Chronic gout, unspecified, without tophus (tophi): Secondary | ICD-10-CM

## 2020-12-10 DIAGNOSIS — I10 Essential (primary) hypertension: Secondary | ICD-10-CM

## 2020-12-10 DIAGNOSIS — R7989 Other specified abnormal findings of blood chemistry: Secondary | ICD-10-CM

## 2020-12-10 DIAGNOSIS — E785 Hyperlipidemia, unspecified: Secondary | ICD-10-CM

## 2020-12-10 LAB — CBC WITH DIFFERENTIAL/PLATELET
Basophils Absolute: 0 10*3/uL (ref 0.0–0.1)
Basophils Relative: 0.5 % (ref 0.0–3.0)
Eosinophils Absolute: 0.2 10*3/uL (ref 0.0–0.7)
Eosinophils Relative: 2.2 % (ref 0.0–5.0)
HCT: 47.5 % (ref 39.0–52.0)
Hemoglobin: 16.2 g/dL (ref 13.0–17.0)
Lymphocytes Relative: 12.9 % (ref 12.0–46.0)
Lymphs Abs: 1.2 10*3/uL (ref 0.7–4.0)
MCHC: 34.1 g/dL (ref 30.0–36.0)
MCV: 92.1 fl (ref 78.0–100.0)
Monocytes Absolute: 0.8 10*3/uL (ref 0.1–1.0)
Monocytes Relative: 8.6 % (ref 3.0–12.0)
Neutro Abs: 7 10*3/uL (ref 1.4–7.7)
Neutrophils Relative %: 75.8 % (ref 43.0–77.0)
Platelets: 169 10*3/uL (ref 150.0–400.0)
RBC: 5.16 Mil/uL (ref 4.22–5.81)
RDW: 13.6 % (ref 11.5–15.5)
WBC: 9.3 10*3/uL (ref 4.0–10.5)

## 2020-12-10 LAB — LIPID PANEL
Cholesterol: 196 mg/dL (ref 0–200)
HDL: 48.3 mg/dL (ref >39.00)
NonHDL: 147.45
Total CHOL/HDL Ratio: 4
Triglycerides: 208 mg/dL — ABNORMAL HIGH (ref 0.0–149.0)
VLDL: 41.6 mg/dL — ABNORMAL HIGH (ref 0.0–40.0)

## 2020-12-10 LAB — HEPATIC FUNCTION PANEL
ALT: 28 U/L (ref 0–53)
AST: 19 U/L (ref 0–37)
Albumin: 4.2 g/dL (ref 3.5–5.2)
Alkaline Phosphatase: 51 U/L (ref 39–117)
Bilirubin, Direct: 0.1 mg/dL (ref 0.0–0.3)
Total Bilirubin: 0.9 mg/dL (ref 0.2–1.2)
Total Protein: 6.2 g/dL (ref 6.0–8.3)

## 2020-12-10 LAB — LDL CHOLESTEROL, DIRECT: Direct LDL: 109 mg/dL

## 2020-12-10 LAB — BASIC METABOLIC PANEL
BUN: 10 mg/dL (ref 6–23)
CO2: 25 mEq/L (ref 19–32)
Calcium: 9.3 mg/dL (ref 8.4–10.5)
Chloride: 102 mEq/L (ref 96–112)
Creatinine, Ser: 1.03 mg/dL (ref 0.40–1.50)
GFR: 77.1 mL/min (ref >60.00)
Glucose, Bld: 117 mg/dL — ABNORMAL HIGH (ref 70–99)
Potassium: 4.1 mEq/L (ref 3.5–5.1)
Sodium: 137 mEq/L (ref 135–145)

## 2020-12-10 LAB — PSA: PSA: 1.22 ng/mL (ref 0.10–4.00)

## 2020-12-10 LAB — URIC ACID: Uric Acid, Serum: 5.2 mg/dL (ref 4.0–7.8)

## 2020-12-10 LAB — TESTOSTERONE: Testosterone: 232.03 ng/dL — ABNORMAL LOW (ref 300.00–890.00)

## 2020-12-10 NOTE — Addendum Note (Signed)
Addended by: Zamora Colton E on: 12/10/2020 08:36 AM   Modules accepted: Orders  

## 2020-12-10 NOTE — Addendum Note (Signed)
Addended by: Ezekiah Massie E on: 12/10/2020 08:37 AM   Modules accepted: Orders  

## 2020-12-10 NOTE — Addendum Note (Signed)
Addended by: Ananya Mccleese E on: 12/10/2020 08:36 AM   Modules accepted: Orders  

## 2020-12-10 NOTE — Addendum Note (Signed)
Addended by: Octavio Manns E on: 12/10/2020 08:36 AM   Modules accepted: Orders

## 2020-12-10 NOTE — Addendum Note (Signed)
Addended by: Sheila Ocasio E on: 12/10/2020 08:36 AM   Modules accepted: Orders  

## 2020-12-10 NOTE — Addendum Note (Signed)
Addended by: Colin Benton on: 12/10/2020 08:37 AM   Modules accepted: Orders

## 2020-12-10 NOTE — Addendum Note (Signed)
Addended by: Devona Holmes E on: 12/10/2020 08:37 AM   Modules accepted: Orders  

## 2020-12-10 NOTE — Addendum Note (Signed)
Addended by: Breccan Galant E on: 12/10/2020 08:36 AM   Modules accepted: Orders  

## 2020-12-15 ENCOUNTER — Ambulatory Visit (INDEPENDENT_AMBULATORY_CARE_PROVIDER_SITE_OTHER): Payer: BC Managed Care – PPO | Admitting: Family Medicine

## 2020-12-15 ENCOUNTER — Other Ambulatory Visit: Payer: Self-pay

## 2020-12-15 ENCOUNTER — Encounter: Payer: Self-pay | Admitting: Family Medicine

## 2020-12-15 VITALS — BP 120/90 | HR 83 | Temp 98.1°F | Ht 75.0 in | Wt 263.8 lb

## 2020-12-15 DIAGNOSIS — I10 Essential (primary) hypertension: Secondary | ICD-10-CM | POA: Diagnosis not present

## 2020-12-15 DIAGNOSIS — E291 Testicular hypofunction: Secondary | ICD-10-CM

## 2020-12-15 DIAGNOSIS — M1000 Idiopathic gout, unspecified site: Secondary | ICD-10-CM | POA: Diagnosis not present

## 2020-12-15 DIAGNOSIS — G8929 Other chronic pain: Secondary | ICD-10-CM

## 2020-12-15 DIAGNOSIS — M545 Low back pain, unspecified: Secondary | ICD-10-CM

## 2020-12-15 DIAGNOSIS — E782 Mixed hyperlipidemia: Secondary | ICD-10-CM

## 2020-12-15 LAB — FOLLICLE STIMULATING HORMONE: FSH: 6.8 m[IU]/mL (ref 1.4–18.1)

## 2020-12-15 LAB — LUTEINIZING HORMONE: LH: 3.95 m[IU]/mL (ref 1.50–9.30)

## 2020-12-15 LAB — TESTOSTERONE: Testosterone: 363.33 ng/dL (ref 300.00–890.00)

## 2020-12-15 MED ORDER — HYDROCODONE-ACETAMINOPHEN 5-325 MG PO TABS: 1.0000 | ORAL_TABLET | ORAL | 0 refills | Status: DC | PRN

## 2020-12-15 NOTE — Progress Notes (Signed)
Established Patient Office Visit  Subjective:  Patient ID: Paul Morrison, male    DOB: 1956/02/02  Age: 65 y.o. MRN: [id number]  CC:  Chief Complaint  Patient presents with  . Medication Problem    Patient states he is concerned about taking Gabapentin per Dr Trenton Gammon and questions interactions with other medications  . Hypertension    Patient states he is concerned with elevated systolic blood pressure readings of 160 at the back doctor's office  . Fatigue    HPI Paul Morrison presents for medical follow-up.  He has had some ongoing back difficulties and placed recently on high-dose gabapentin 1800 mg daily per neurosurgery.  His back pain is doing relatively well but still has exacerbations of time.  Very infrequently supplements with hydrocodone and requesting refill.  He has history of fatigue and has had some recent weight gain.  Has had low testosterone in the past and had recent labs with total testosterone 232.  He has specifically interested in replacement.  Previously used topical and topical under the arm and axillary region seem to work best.  History of gout.  He has been on allopurinol for some time and this seemed to be working well.  No recent gout flareups.  Recent uric acid 5.2.  Hyperlipidemia.  10-year risk of CAD over 17%.  Takes high-dose Lipitor 40 mg daily.  Non-smoker.  No family history of premature CAD.  Past Medical History:  Diagnosis Date  . Arthritis   . Cancer ([place])    skin cancer left ear- MOHS  . Hyperlipidemia   . Hypertension   . Low back pain     Past Surgical History:  Procedure Laterality Date  . COLONOSCOPY    . HERNIA REPAIR    . INJECTION KNEE     both knees  . keratatomy    . knee arthroscopy    . LUMBAR FUSION    . MOHS SURGERY    . POLYPECTOMY    . TOTAL KNEE ARTHROPLASTY Right 11/24/2016   Procedure: RIGHT TOTAL KNEE ARTHROPLASTY;  Surgeon: [name], MD;  Location: [facility];  Service: Orthopedics;  Laterality: Right;   Marland Kitchen VASECTOMY      Family History  Problem Relation Age of Onset  . Hyperlipidemia Mother   . Hypertension Mother   . Asthma Mother   . Coronary artery disease Other   . Aneurysm Other   . Colon cancer Neg Hx   . Colon polyps Neg Hx   . Esophageal cancer Neg Hx   . Rectal cancer Neg Hx   . Stomach cancer Neg Hx     Social History   Socioeconomic History  . Marital status: Married    Spouse name: Not on file  . Number of children: Not on file  . Years of education: Not on file  . Highest education level: Not on file  Occupational History  . Not on file  Tobacco Use  . Smoking status: Former Research scientist (life sciences)  . Smokeless tobacco: Never Used  Vaping Use  . Vaping Use: Never used  Substance and Sexual Activity  . Alcohol use: Yes    Comment: Occas  . Drug use: No  . Sexual activity: Yes  Other Topics Concern  . Not on file  Social History Narrative  . Not on file   Social Determinants of Health   Financial Resource Strain: Not on file  Food Insecurity: Not on file  Transportation Needs: Not on file  Physical Activity: Not  on file  Stress: Not on file  Social Connections: Not on file  Intimate Partner Violence: Not on file    Outpatient Medications Prior to Visit  Medication Sig Dispense Refill  . allopurinol (ZYLOPRIM) 300 MG tablet TAKE ONE TABLET DAILY 90 tablet 0  . ALPRAZolam (XANAX) 0.5 MG tablet Take 1 tablet (0.5 mg total) by mouth 3 (three) times daily as needed for anxiety. 30 tablet 0  . amoxicillin (AMOXIL) 500 MG capsule Take 4 tablets one hour prior to dental procedure. 4 capsule 0  . atorvastatin (LIPITOR) 40 MG tablet TAKE ONE TABLET DAILY 90 tablet 1  . colchicine 0.6 MG tablet Take one tablet twice daily as needed for gout flare 60 tablet 3  . diclofenac Sodium (VOLTAREN) 1 % GEL Apply topically 4 (four) times daily.    . indomethacin (INDOCIN) 50 MG capsule TAKE ONE (1) CAPSULE THREE (3) TIMES EACH DAY AS NEEDED 30 capsule 0  . losartan (COZAAR) 100  MG tablet TAKE ONE TABLET DAILY 90 tablet 0  . mupirocin ointment (BACTROBAN) 2 % Apply intranasal bid for 5 days. 22 g 1  . sildenafil (REVATIO) 20 MG tablet Take 2 to 5 tablets one hour prior to sexual activity 50 tablet 5  . HYDROcodone-acetaminophen (NORCO/VICODIN) 5-325 MG tablet Take 1-2 tablets by mouth every 4 (four) hours as needed for moderate pain. One po q 6 hours prn pain.  May refill on or after 06-25-17 30 tablet 0  . losartan (COZAAR) 50 MG tablet TAKE TWO TABLETS EVERY DAY (Patient not taking: Reported on 09/10/2019) 60 tablet 0  . diclofenac Sodium (VOLTAREN) 1 % GEL Voltaren 1 % topical gel  APPLY 4 GRAMS TO THE AFFECTED AREA(S) BY TOPICAL ROUTE 4 TIMES PER DAY    . Glucosamine-Chondroitin (COSAMIN DS PO) Take 1 capsule by mouth daily as needed (pain).    . predniSONE (DELTASONE) 10 MG tablet Taper as follows: 4-4-4-4-3-3-3-2-2-1-1 31 tablet 0   Facility-Administered Medications Prior to Visit  Medication Dose Route Frequency Provider Last Rate Last Admin  . 0.9 %  sodium chloride infusion  500 mL Intravenous Once Doran Stabler, MD        Allergies  Allergen Reactions  . Cephalexin Other (See Comments)    Stomach cramping   . Celecoxib Rash    ROS Review of Systems  Constitutional: Positive for fatigue. Negative for chills and unexpected weight change.  Eyes: Negative for visual disturbance.  Respiratory: Negative for cough, chest tightness and shortness of breath.   Cardiovascular: Negative for chest pain, palpitations and leg swelling.  Endocrine: Negative for polydipsia and polyuria.  Neurological: Negative for dizziness, syncope, weakness, light-headedness and headaches.      Objective:    Physical Exam Constitutional:      Appearance: He is well-developed.  Eyes:     Pupils: Pupils are equal, round, and reactive to light.  Neck:     Thyroid: No thyromegaly.  Cardiovascular:     Rate and Rhythm: Normal rate and regular rhythm.  Pulmonary:      Effort: Pulmonary effort is normal. No respiratory distress.     Breath sounds: Normal breath sounds. No wheezing or rales.  Musculoskeletal:     Cervical back: Neck supple.     Right lower leg: No edema.     Left lower leg: No edema.  Neurological:     Mental Status: He is alert and oriented to person, place, and time.     BP 120/90 (BP  Location: Left Arm, Patient Position: Sitting, Cuff Size: Large)   Pulse 83   Temp 98.1 F (36.7 C) (Oral)   Ht 6\' 3"  (1.905 m)   Wt 263 lb 12.8 oz (119.7 kg)   SpO2 96%   BMI 32.97 kg/m  Wt Readings from Last 3 Encounters:  12/15/20 263 lb 12.8 oz (119.7 kg)  12/31/19 252 lb (114.3 kg)  09/10/19 262 lb 3.2 oz (118.9 kg)     Health Maintenance Due  Topic Date Due  . HIV Screening  Never done  . COVID-19 Vaccine (4 - Booster for Pfizer series) 08/29/2020    There are no preventive care reminders to display for this patient.  Lab Results  Component Value Date   TSH 3.29 03/24/2020   Lab Results  Component Value Date   WBC 9.3 12/10/2020   HGB 16.2 12/10/2020   HCT 47.5 12/10/2020   MCV 92.1 12/10/2020   PLT 169.0 12/10/2020   Lab Results  Component Value Date   NA 137 12/10/2020   K 4.1 12/10/2020   CO2 25 12/10/2020   GLUCOSE 117 (H) 12/10/2020   BUN 10 12/10/2020   CREATININE 1.03 12/10/2020   BILITOT 0.9 12/10/2020   ALKPHOS 51 12/10/2020   AST 19 12/10/2020   ALT 28 12/10/2020   PROT 6.2 12/10/2020   ALBUMIN 4.2 12/10/2020   CALCIUM 9.3 12/10/2020   ANIONGAP 7 11/25/2016   GFR 77.10 12/10/2020   Lab Results  Component Value Date   CHOL 196 12/10/2020   Lab Results  Component Value Date   HDL 48.30 12/10/2020   Lab Results  Component Value Date   LDLCALC 126 (H) 03/24/2020   Lab Results  Component Value Date   TRIG 208.0 (H) 12/10/2020   Lab Results  Component Value Date   CHOLHDL 4 12/10/2020   Lab Results  Component Value Date   HGBA1C 6.2 01/09/2011      Assessment & Plan:   #1  hypogonadism by recent labs.  -Recheck early morning total testosterone and also check LH and FSH to confirm this is primary hypogonadism.  If confirms low consider starting topical therapy  #2 gout stable on allopurinol with uric acid at goal -Continue allopurinol 300 mg daily  #3 chronic back pain.  Followed by neurosurgery.  Patient requesting refill of hydrocodone which she uses very infrequently. -1 refill for number 30 tablets given  #4 hyperlipidemia.  Recent LDL cholesterol 109.  We discussed more aggressive lipid control with addition possibly of Zetia or increasing Lipitor to 80 mg.  He prefers a 3 to 72-month trial of losing some weight first and then rechecking.  Meds ordered this encounter  Medications  . HYDROcodone-acetaminophen (NORCO/VICODIN) 5-325 MG tablet    Sig: Take 1-2 tablets by mouth every 4 (four) hours as needed for moderate pain. One po q 6 hours prn pain.  May refill on or after 06-25-17    Dispense:  30 tablet    Refill:  0    Follow-up: No follow-ups on file.    [name], MD

## 2020-12-16 ENCOUNTER — Encounter: Payer: Self-pay | Admitting: Family Medicine

## 2020-12-19 MED ORDER — TESTOSTERONE 20.25 MG/ACT (1.62%) TD GEL: TRANSDERMAL | 2 refills | Status: DC

## 2020-12-19 NOTE — Telephone Encounter (Signed)
I sent in Androgel 1.62%- one pump spray per arm once daily and recommend 2 month follow up for repeat testosterone level.

## 2020-12-23 ENCOUNTER — Other Ambulatory Visit: Payer: Self-pay | Admitting: Family Medicine

## 2021-01-17 ENCOUNTER — Other Ambulatory Visit: Payer: Self-pay | Admitting: Family Medicine

## 2021-01-17 NOTE — Telephone Encounter (Signed)
Last filled in 2020. [facility] for refills?

## 2021-01-21 ENCOUNTER — Other Ambulatory Visit: Payer: Self-pay | Admitting: Family Medicine

## 2021-02-14 ENCOUNTER — Other Ambulatory Visit: Payer: Self-pay | Admitting: Family Medicine

## 2021-02-14 ENCOUNTER — Other Ambulatory Visit: Payer: Self-pay | Admitting: Student

## 2021-02-14 DIAGNOSIS — M48062 Spinal stenosis, lumbar region with neurogenic claudication: Secondary | ICD-10-CM

## 2021-02-17 ENCOUNTER — Other Ambulatory Visit: Payer: Self-pay

## 2021-02-17 ENCOUNTER — Ambulatory Visit
Admission: RE | Admit: 2021-02-17 | Discharge: 2021-02-17 | Disposition: A | Discharge status: Home / Self Care | Payer: BC Managed Care – PPO | Source: Ambulatory Visit | Attending: Student | Admitting: Student

## 2021-02-17 DIAGNOSIS — M48062 Spinal stenosis, lumbar region with neurogenic claudication: Secondary | ICD-10-CM

## 2021-02-23 ENCOUNTER — Other Ambulatory Visit: Payer: Self-pay | Admitting: Neurosurgery

## 2021-03-02 ENCOUNTER — Other Ambulatory Visit: Payer: Self-pay | Admitting: Family Medicine

## 2021-03-03 ENCOUNTER — Encounter: Payer: Self-pay | Admitting: Family Medicine

## 2021-03-03 DIAGNOSIS — R7989 Other specified abnormal findings of blood chemistry: Secondary | ICD-10-CM

## 2021-03-03 NOTE — Telephone Encounter (Signed)
I placed future order for [facility] lab for 8-19.   Hope I placed order correctly.

## 2021-03-16 NOTE — Progress Notes (Signed)
Surgical Instructions    Your procedure is scheduled on 03/28/21.  Report to [facility] Main Entrance "A" at 06:00 A.M., then check in with the Admitting office.  Call this number if you have problems the morning of surgery:  [phone]   If you have any questions prior to your surgery date call [phone]: Open Monday-Friday 8am-4pm    Remember:  Do not eat or drink after midnight the night before your surgery      Take these medicines the morning of surgery with A SIP OF WATER  allopurinol (ZYLOPRIM)  atorvastatin (LIPITOR)  Colchicine if needed gabapentin (NEURONTIN) HYDROmorphone (DILAUDID) if needed    As of today, STOP taking any Aspirin (unless otherwise instructed by your surgeon) Aleve, Naproxen, Ibuprofen, Motrin, Advil, Goody's, BC's, all herbal medications, fish oil, indomethacin (Indocin) and all vitamins.          Do not wear jewelry or makeup Do not wear lotions, powders, perfumes/colognes, or deodorant. Men may shave face and neck. Do not bring valuables to the hospital. DO Not wear nail polish, gel polish, artificial nails, or any other type of covering on natural nails  including finger and toenails. If patients have artificial nails, gel coating, etc. that need to be removed by a nail salon please have this removed prior to surgery or surgery may need to be canceled/delayed if the surgeon/ anesthesia feels like the patient is unable to be adequately monitored.             [facility] is not responsible for any belongings or valuables.  Do NOT Smoke (Tobacco/Vaping) or drink Alcohol 24 hours prior to your procedure If you use a CPAP at night, you may bring all equipment for your overnight stay.   Contacts, glasses, dentures or bridgework may not be worn into surgery, please bring cases for these belongings   For patients admitted to the hospital, discharge time will be determined by your treatment team.   Patients discharged the day of surgery will not  be allowed to drive home, and someone needs to stay with them for 24 hours.  ONLY 1 SUPPORT PERSON MAY BE PRESENT WHILE YOU ARE IN SURGERY. IF YOU ARE TO BE ADMITTED ONCE YOU ARE IN YOUR ROOM YOU WILL BE ALLOWED TWO (2) VISITORS.  Minor children may have two parents present. Special consideration for safety and communication needs will be reviewed on a case by case basis.  Special instructions:    Oral Hygiene is also important to reduce your risk of infection.  Remember - BRUSH YOUR TEETH THE MORNING OF SURGERY WITH YOUR REGULAR TOOTHPASTE   [facility]- Preparing For Surgery  Before surgery, you can play an important role. Because skin is not sterile, your skin needs to be as free of germs as possible. You can reduce the number of germs on your skin by washing with CHG (chlorahexidine gluconate) Soap before surgery.  CHG is an antiseptic cleaner which kills germs and bonds with the skin to continue killing germs even after washing.     Please do not use if you have an allergy to CHG or antibacterial soaps. If your skin becomes reddened/irritated stop using the CHG.  Do not shave (including legs and underarms) for at least 48 hours prior to first CHG shower. It is OK to shave your face.  Please follow these instructions carefully.     Shower the NIGHT BEFORE SURGERY and the MORNING OF SURGERY with CHG Soap.   If you chose  to wash your hair, wash your hair first as usual with your normal shampoo. After you shampoo, rinse your hair and body thoroughly to remove the shampoo.  Then [employer] and genitals (private parts) with your normal soap and rinse thoroughly to remove soap.  After that Use CHG Soap as you would any other liquid soap. You can apply CHG directly to the skin and wash gently with a scrungie or a clean washcloth.   Apply the CHG Soap to your body ONLY FROM THE NECK DOWN.  Do not use on open wounds or open sores. Avoid contact with your eyes, ears, mouth and genitals (private  parts). Wash Face and genitals (private parts)  with your normal soap.   Wash thoroughly, paying special attention to the area where your surgery will be performed.  Thoroughly rinse your body with warm water from the neck down.  DO NOT shower/wash with your normal soap after using and rinsing off the CHG Soap.  Pat yourself dry with a CLEAN TOWEL.  Wear CLEAN PAJAMAS to bed the night before surgery  Place CLEAN SHEETS on your bed the night before your surgery  DO NOT SLEEP WITH PETS.   Day of Surgery: Take a shower with CHG soap. Wear Clean/Comfortable clothing the morning of surgery Do not apply any deodorants/lotions.   Remember to brush your teeth WITH YOUR REGULAR TOOTHPASTE.   Please read over the following fact sheets that you were given.

## 2021-03-17 ENCOUNTER — Other Ambulatory Visit: Payer: Self-pay

## 2021-03-17 ENCOUNTER — Encounter (HOSPITAL_COMMUNITY): Payer: Self-pay

## 2021-03-17 ENCOUNTER — Encounter (HOSPITAL_COMMUNITY)
Admission: RE | Admit: 2021-03-17 | Discharge: 2021-03-17 | Disposition: A | Discharge status: Home / Self Care | Payer: BC Managed Care – PPO | Source: Ambulatory Visit | Attending: Neurosurgery | Admitting: Neurosurgery

## 2021-03-17 DIAGNOSIS — Z01818 Encounter for other preprocedural examination: Secondary | ICD-10-CM | POA: Diagnosis present

## 2021-03-17 DIAGNOSIS — I440 Atrioventricular block, first degree: Secondary | ICD-10-CM | POA: Diagnosis not present

## 2021-03-17 LAB — CBC WITH DIFFERENTIAL/PLATELET
Abs Immature Granulocytes: 0.03 10*3/uL (ref 0.00–0.07)
Basophils Absolute: 0 10*3/uL (ref 0.0–0.1)
Basophils Relative: 1 %
Eosinophils Absolute: 0.1 10*3/uL (ref 0.0–0.5)
Eosinophils Relative: 2 %
HCT: 54.1 % — ABNORMAL HIGH (ref 39.0–52.0)
Hemoglobin: 18.2 g/dL — ABNORMAL HIGH (ref 13.0–17.0)
Immature Granulocytes: 1 %
Lymphocytes Relative: 20 %
Lymphs Abs: 1.3 10*3/uL (ref 0.7–4.0)
MCH: 31.5 pg (ref 26.0–34.0)
MCHC: 33.6 g/dL (ref 30.0–36.0)
MCV: 93.6 fL (ref 80.0–100.0)
Monocytes Absolute: 0.7 10*3/uL (ref 0.1–1.0)
Monocytes Relative: 11 %
Neutro Abs: 4.2 10*3/uL (ref 1.7–7.7)
Neutrophils Relative %: 65 %
Platelets: 175 10*3/uL (ref 150–400)
RBC: 5.78 MIL/uL (ref 4.22–5.81)
RDW: 13.5 % (ref 11.5–15.5)
WBC: 6.4 10*3/uL (ref 4.0–10.5)
nRBC: 0 % (ref 0.0–0.2)

## 2021-03-17 LAB — BASIC METABOLIC PANEL
Anion gap: 8 (ref 5–15)
BUN: 19 mg/dL (ref 8–23)
CO2: 26 mmol/L (ref 22–32)
Calcium: 9.6 mg/dL (ref 8.9–10.3)
Chloride: 101 mmol/L (ref 98–111)
Creatinine, Ser: 1.02 mg/dL (ref 0.61–1.24)
GFR, Estimated: >60 mL/min (ref >60)
Glucose, Bld: 114 mg/dL — ABNORMAL HIGH (ref 70–99)
Potassium: 4.1 mmol/L (ref 3.5–5.1)
Sodium: 135 mmol/L (ref 135–145)

## 2021-03-17 LAB — SURGICAL PCR SCREEN
MRSA, PCR: NEGATIVE
Staphylococcus aureus: NEGATIVE

## 2021-03-17 LAB — TYPE AND SCREEN
ABO/RH(D): AB POS
Antibody Screen: NEGATIVE

## 2021-03-17 NOTE — Progress Notes (Addendum)
PCP: [name], MD Cardiologist: denies  EKG: 03/17/21 CXR: na ECHO: denies Stress Test: denies Cardiac Cath: denies  Fasting Blood Sugar- na Checks Blood Sugar__na_ times a day  OSA/CPAP: No  ASA/Blood Thinner: No  Covid test 03/24/21.  Instructions, map and requisition given  Anesthesia Review: Yes, abnormal EKG.  Change from previous  Patient denies shortness of breath, fever, cough, and chest pain at PAT appointment.  Patient verbalized understanding of instructions provided today at the PAT appointment.  Patient asked to review instructions at home and day of surgery.

## 2021-03-17 NOTE — Progress Notes (Signed)
Surgical Instructions    Your procedure is scheduled on 03/28/21.  Report to [facility] Main Entrance "A" at 06:00 A.M., then check in with the Admitting office.  Call this number if you have problems the morning of surgery:  [phone]   If you have any questions prior to your surgery date call [phone]: Open Monday-Friday 8am-4pm    Remember:  Do not eat after midnight the night before your surgery  You may drink clear fluids, water, clear juices without pulp, cabonated beverages, black tea and black coffee, and gatorade until 5:00 am.     Take these medicines the morning of surgery with A SIP OF WATER  allopurinol (ZYLOPRIM)  atorvastatin (LIPITOR)  Colchicine if needed gabapentin (NEURONTIN) HYDROmorphone (DILAUDID) if needed    As of today, STOP taking any Aspirin (unless otherwise instructed by your surgeon) Aleve, Naproxen, Ibuprofen, Motrin, Advil, Goody's, BC's, all herbal medications, fish oil, indomethacin (Indocin) and all vitamins.          Do not wear jewelry or makeup Do not wear lotions, powders, perfumes/colognes, or deodorant. Men may shave face and neck. Do not bring valuables to the hospital. DO Not wear nail polish, gel polish, artificial nails, or any other type of covering on natural nails  including finger and toenails. If patients have artificial nails, gel coating, etc. that need to be removed by a nail salon please have this removed prior to surgery or surgery may need to be canceled/delayed if the surgeon/ anesthesia feels like the patient is unable to be adequately monitored.             [facility] is not responsible for any belongings or valuables.  Do NOT Smoke (Tobacco/Vaping) or drink Alcohol 24 hours prior to your procedure If you use a CPAP at night, you may bring all equipment for your overnight stay.   Contacts, glasses, dentures or bridgework may not be worn into surgery, please bring cases for these belongings   For patients  admitted to the hospital, discharge time will be determined by your treatment team.   Patients discharged the day of surgery will not be allowed to drive home, and someone needs to stay with them for 24 hours.  ONLY 1 SUPPORT PERSON MAY BE PRESENT WHILE YOU ARE IN SURGERY. IF YOU ARE TO BE ADMITTED ONCE YOU ARE IN YOUR ROOM YOU WILL BE ALLOWED TWO (2) VISITORS.  Minor children may have two parents present. Special consideration for safety and communication needs will be reviewed on a case by case basis.  Special instructions:    Oral Hygiene is also important to reduce your risk of infection.  Remember - BRUSH YOUR TEETH THE MORNING OF SURGERY WITH YOUR REGULAR TOOTHPASTE   - Preparing For Surgery  Before surgery, you can play an important role. Because skin is not sterile, your skin needs to be as free of germs as possible. You can reduce the number of germs on your skin by washing with CHG (chlorahexidine gluconate) Soap before surgery.  CHG is an antiseptic cleaner which kills germs and bonds with the skin to continue killing germs even after washing.     Please do not use if you have an allergy to CHG or antibacterial soaps. If your skin becomes reddened/irritated stop using the CHG.  Do not shave (including legs and underarms) for at least 48 hours prior to first CHG shower. It is OK to shave your face.  Please follow these instructions carefully.  Shower the NIGHT BEFORE SURGERY and the MORNING OF SURGERY with CHG Soap.   If you chose to wash your hair, wash your hair first as usual with your normal shampoo. After you shampoo, rinse your hair and body thoroughly to remove the shampoo.  Then [employer] and genitals (private parts) with your normal soap and rinse thoroughly to remove soap.  After that Use CHG Soap as you would any other liquid soap. You can apply CHG directly to the skin and wash gently with a scrungie or a clean washcloth.   Apply the CHG Soap to your  body ONLY FROM THE NECK DOWN.  Do not use on open wounds or open sores. Avoid contact with your eyes, ears, mouth and genitals (private parts). Wash Face and genitals (private parts)  with your normal soap.   Wash thoroughly, paying special attention to the area where your surgery will be performed.  Thoroughly rinse your body with warm water from the neck down.  DO NOT shower/wash with your normal soap after using and rinsing off the CHG Soap.  Pat yourself dry with a CLEAN TOWEL.  Wear CLEAN PAJAMAS to bed the night before surgery  Place CLEAN SHEETS on your bed the night before your surgery  DO NOT SLEEP WITH PETS.   Day of Surgery: Take a shower with CHG soap. Wear Clean/Comfortable clothing the morning of surgery Do not apply any deodorants/lotions.   Remember to brush your teeth WITH YOUR REGULAR TOOTHPASTE.   Please read over the following fact sheets that you were given.

## 2021-03-18 ENCOUNTER — Encounter: Payer: Self-pay | Admitting: Family Medicine

## 2021-03-18 ENCOUNTER — Other Ambulatory Visit (INDEPENDENT_AMBULATORY_CARE_PROVIDER_SITE_OTHER): Payer: BC Managed Care – PPO

## 2021-03-18 ENCOUNTER — Encounter (HOSPITAL_COMMUNITY): Payer: Self-pay | Admitting: Physician Assistant

## 2021-03-18 DIAGNOSIS — R7989 Other specified abnormal findings of blood chemistry: Secondary | ICD-10-CM

## 2021-03-18 LAB — CBC WITH DIFFERENTIAL/PLATELET
Basophils Absolute: 0 10*3/uL (ref 0.0–0.1)
Basophils Relative: 0.4 % (ref 0.0–3.0)
Eosinophils Absolute: 0.2 10*3/uL (ref 0.0–0.7)
Eosinophils Relative: 3 % (ref 0.0–5.0)
HCT: 50 % (ref 39.0–52.0)
Hemoglobin: 16.8 g/dL (ref 13.0–17.0)
Lymphocytes Relative: 26.6 % (ref 12.0–46.0)
Lymphs Abs: 1.6 10*3/uL (ref 0.7–4.0)
MCHC: 33.7 g/dL (ref 30.0–36.0)
MCV: 93.7 fl (ref 78.0–100.0)
Monocytes Absolute: 0.7 10*3/uL (ref 0.1–1.0)
Monocytes Relative: 12 % (ref 3.0–12.0)
Neutro Abs: 3.4 10*3/uL (ref 1.4–7.7)
Neutrophils Relative %: 58 % (ref 43.0–77.0)
Platelets: 168 10*3/uL (ref 150.0–400.0)
RBC: 5.33 Mil/uL (ref 4.22–5.81)
RDW: 14.7 % (ref 11.5–15.5)
WBC: 5.9 10*3/uL (ref 4.0–10.5)

## 2021-03-18 LAB — TESTOSTERONE: Testosterone: 173.34 ng/dL — ABNORMAL LOW (ref 300.00–890.00)

## 2021-03-18 NOTE — Telephone Encounter (Signed)
Last filled 12/19/2020 Last OV 12/15/2020  Ok to fill?

## 2021-03-21 MED ORDER — TESTOSTERONE 20.25 MG/ACT (1.62%) TD GEL: TRANSDERMAL | 2 refills | Status: DC

## 2021-03-21 NOTE — Addendum Note (Signed)
Addended by: Eulas Post on: 03/21/2021 07:55 AM   Modules accepted: Orders

## 2021-03-24 ENCOUNTER — Other Ambulatory Visit: Payer: Self-pay | Admitting: Neurosurgery

## 2021-03-24 LAB — SARS CORONAVIRUS 2 (TAT 6-24 HRS): SARS Coronavirus 2: NEGATIVE

## 2021-03-26 ENCOUNTER — Other Ambulatory Visit: Payer: Self-pay | Admitting: Family Medicine

## 2021-03-28 ENCOUNTER — Encounter (HOSPITAL_COMMUNITY): Admission: RE | Payer: Self-pay | Source: Ambulatory Visit

## 2021-03-28 ENCOUNTER — Ambulatory Visit (HOSPITAL_COMMUNITY): Admission: RE | Admit: 2021-03-28 | Payer: BC Managed Care – PPO | Source: Ambulatory Visit | Admitting: Neurosurgery

## 2021-03-28 SURGERY — POSTERIOR LUMBAR FUSION 1 LEVEL
Anesthesia: General | Site: Back

## 2021-03-28 NOTE — Telephone Encounter (Signed)
Please advise. Patient has losartan '100mg'$  and 50 mg on the current med list. Which strength should he be on?

## 2021-03-28 NOTE — Telephone Encounter (Signed)
Medication list has been updated.

## 2021-03-29 ENCOUNTER — Encounter: Payer: Self-pay | Admitting: Family Medicine

## 2021-03-29 MED ORDER — TESTOSTERONE 20.25 MG/ACT (1.62%) TD GEL: TRANSDERMAL | 2 refills | Status: DC

## 2021-04-01 ENCOUNTER — Other Ambulatory Visit: Payer: Self-pay | Admitting: Neurosurgery

## 2021-04-01 ENCOUNTER — Encounter (HOSPITAL_COMMUNITY): Payer: Self-pay | Admitting: Neurosurgery

## 2021-04-01 NOTE — Progress Notes (Signed)
PCP - Dr [name] Cardiologist - n/a  Chest x-ray - n/a EKG - 03/17/21 Stress Test - n/a ECHO - n/a Cardiac Cath - n/a  ICD Pacemaker/Loop - n/a  Sleep Study -  n/a CPAP - none  ASA/Blood Thinner Instructions: n/a   Anesthesia review: Yes-James, PA  STOP now taking any Aspirin (unless otherwise instructed by your surgeon), Aleve, Naproxen, Ibuprofen, Motrin, Advil, Goody's, BC's, all herbal medications, fish oil, and all vitamins.   Coronavirus Screening Covid test is scheduled on 04/01/21 Do you have any of the following symptoms:  Cough yes/no: No Fever (>100.62F)  yes/no: No Runny nose yes/no: No Sore throat yes/no: No Difficulty breathing/shortness of breath  yes/no: No  Have you traveled in the last 14 days and where? yes/no: No  Patient verbalized understanding of instructions that were given via phone.

## 2021-04-02 LAB — SARS CORONAVIRUS 2 (TAT 6-24 HRS): SARS Coronavirus 2: NEGATIVE

## 2021-04-05 ENCOUNTER — Ambulatory Visit (HOSPITAL_COMMUNITY): Payer: BC Managed Care – PPO | Admitting: Physician Assistant

## 2021-04-05 ENCOUNTER — Observation Stay (HOSPITAL_COMMUNITY)
Admission: RE | Admit: 2021-04-05 | Discharge: 2021-04-06 | Disposition: A | Discharge status: Home / Self Care | Payer: BC Managed Care – PPO | Attending: Neurosurgery | Admitting: Neurosurgery

## 2021-04-05 ENCOUNTER — Encounter (HOSPITAL_COMMUNITY)
Admission: RE | Disposition: A | Discharge status: Home / Self Care | Payer: Self-pay | Source: Home / Self Care | Attending: Neurosurgery

## 2021-04-05 ENCOUNTER — Other Ambulatory Visit: Payer: Self-pay

## 2021-04-05 ENCOUNTER — Ambulatory Visit (HOSPITAL_COMMUNITY): Payer: BC Managed Care – PPO | Admitting: Anesthesiology

## 2021-04-05 ENCOUNTER — Encounter (HOSPITAL_COMMUNITY): Payer: Self-pay | Admitting: Neurosurgery

## 2021-04-05 ENCOUNTER — Ambulatory Visit (HOSPITAL_COMMUNITY): Payer: BC Managed Care – PPO

## 2021-04-05 DIAGNOSIS — I10 Essential (primary) hypertension: Secondary | ICD-10-CM | POA: Insufficient documentation

## 2021-04-05 DIAGNOSIS — M5416 Radiculopathy, lumbar region: Secondary | ICD-10-CM | POA: Diagnosis not present

## 2021-04-05 DIAGNOSIS — Z87891 Personal history of nicotine dependence: Secondary | ICD-10-CM | POA: Diagnosis not present

## 2021-04-05 DIAGNOSIS — M4316 Spondylolisthesis, lumbar region: Secondary | ICD-10-CM | POA: Diagnosis present

## 2021-04-05 DIAGNOSIS — Z419 Encounter for procedure for purposes other than remedying health state, unspecified: Secondary | ICD-10-CM

## 2021-04-05 DIAGNOSIS — Z85828 Personal history of other malignant neoplasm of skin: Secondary | ICD-10-CM | POA: Diagnosis not present

## 2021-04-05 DIAGNOSIS — M48061 Spinal stenosis, lumbar region without neurogenic claudication: Secondary | ICD-10-CM | POA: Insufficient documentation

## 2021-04-05 DIAGNOSIS — Z79899 Other long term (current) drug therapy: Secondary | ICD-10-CM | POA: Insufficient documentation

## 2021-04-05 DIAGNOSIS — Z96651 Presence of right artificial knee joint: Secondary | ICD-10-CM | POA: Diagnosis not present

## 2021-04-05 DIAGNOSIS — M4310 Spondylolisthesis, site unspecified: Secondary | ICD-10-CM | POA: Diagnosis present

## 2021-04-05 LAB — TYPE AND SCREEN
ABO/RH(D): AB POS
Antibody Screen: NEGATIVE

## 2021-04-05 SURGERY — POSTERIOR LUMBAR FUSION 1 LEVEL
Anesthesia: General | Site: Back

## 2021-04-05 MED ORDER — DIAZEPAM 5 MG PO TABS
5.0000 mg | ORAL_TABLET | Freq: Four times a day (QID) | ORAL | Status: DC | PRN
  Administered 2021-04-05: 10 mg via ORAL
  Administered 2021-04-06: 5 mg via ORAL
  Filled 2021-04-05: qty 2
  Filled 2021-04-05: qty 1

## 2021-04-05 MED ORDER — ONDANSETRON HCL 4 MG/2ML IJ SOLN
INTRAMUSCULAR | Status: AC
  Filled 2021-04-05: qty 2

## 2021-04-05 MED ORDER — SODIUM CHLORIDE 0.9 % IV SOLN: 250.0000 mL | INTRAVENOUS | Status: DC

## 2021-04-05 MED ORDER — DEXAMETHASONE SODIUM PHOSPHATE 10 MG/ML IJ SOLN
INTRAMUSCULAR | Status: DC | PRN
  Administered 2021-04-05: 10 mg via INTRAVENOUS

## 2021-04-05 MED ORDER — ATORVASTATIN CALCIUM 40 MG PO TABS
40.0000 mg | ORAL_TABLET | Freq: Every day | ORAL | Status: DC
  Filled 2021-04-05: qty 1

## 2021-04-05 MED ORDER — ALLOPURINOL 300 MG PO TABS
300.0000 mg | ORAL_TABLET | Freq: Every day | ORAL | Status: DC
  Administered 2021-04-05: 300 mg via ORAL
  Filled 2021-04-05 (×2): qty 1

## 2021-04-05 MED ORDER — BUPIVACAINE HCL (PF) 0.25 % IJ SOLN
INTRAMUSCULAR | Status: AC
  Filled 2021-04-05: qty 30

## 2021-04-05 MED ORDER — ONDANSETRON HCL 4 MG/2ML IJ SOLN
INTRAMUSCULAR | Status: DC | PRN
  Administered 2021-04-05: 4 mg via INTRAVENOUS

## 2021-04-05 MED ORDER — OXYCODONE HCL 5 MG/5ML PO SOLN: 5.0000 mg | Freq: Once | ORAL | Status: DC | PRN

## 2021-04-05 MED ORDER — HYDROMORPHONE HCL 1 MG/ML IJ SOLN
1.0000 mg | INTRAMUSCULAR | Status: DC | PRN
  Administered 2021-04-05: 1 mg via INTRAVENOUS
  Filled 2021-04-05: qty 1

## 2021-04-05 MED ORDER — HYDROMORPHONE HCL 1 MG/ML IJ SOLN
0.2500 mg | INTRAMUSCULAR | Status: DC | PRN
  Administered 2021-04-05 (×3): 0.5 mg via INTRAVENOUS
  Administered 2021-04-05 (×2): 0.25 mg via INTRAVENOUS

## 2021-04-05 MED ORDER — ACETAMINOPHEN 650 MG RE SUPP: 650.0000 mg | RECTAL | Status: DC | PRN

## 2021-04-05 MED ORDER — ACETAMINOPHEN 500 MG PO TABS
1000.0000 mg | ORAL_TABLET | Freq: Once | ORAL | Status: AC
  Administered 2021-04-05: 1000 mg via ORAL
  Filled 2021-04-05: qty 2

## 2021-04-05 MED ORDER — PHENYLEPHRINE 40 MCG/ML (10ML) SYRINGE FOR IV PUSH (FOR BLOOD PRESSURE SUPPORT)
PREFILLED_SYRINGE | INTRAVENOUS | Status: AC
  Filled 2021-04-05: qty 10

## 2021-04-05 MED ORDER — PROPOFOL 10 MG/ML IV BOLUS
INTRAVENOUS | Status: DC | PRN
  Administered 2021-04-05: 200 mg via INTRAVENOUS

## 2021-04-05 MED ORDER — HYDROMORPHONE HCL 1 MG/ML IJ SOLN
INTRAMUSCULAR | Status: AC
  Filled 2021-04-05: qty 1

## 2021-04-05 MED ORDER — CHLORHEXIDINE GLUCONATE 0.12 % MT SOLN
15.0000 mL | Freq: Once | OROMUCOSAL | Status: AC
  Administered 2021-04-05: 15 mL via OROMUCOSAL
  Filled 2021-04-05: qty 15

## 2021-04-05 MED ORDER — ALPRAZOLAM 0.5 MG PO TABS: 0.5000 mg | ORAL_TABLET | Freq: Three times a day (TID) | ORAL | Status: DC | PRN

## 2021-04-05 MED ORDER — LIDOCAINE 2% (20 MG/ML) 5 ML SYRINGE
INTRAMUSCULAR | Status: AC
  Filled 2021-04-05: qty 5

## 2021-04-05 MED ORDER — VANCOMYCIN HCL IN DEXTROSE 1-5 GM/200ML-% IV SOLN
1000.0000 mg | Freq: Once | INTRAVENOUS | Status: AC
  Administered 2021-04-05: 1000 mg via INTRAVENOUS

## 2021-04-05 MED ORDER — LIDOCAINE 2% (20 MG/ML) 5 ML SYRINGE
INTRAMUSCULAR | Status: DC | PRN
  Administered 2021-04-05: 60 mg via INTRAVENOUS

## 2021-04-05 MED ORDER — VANCOMYCIN HCL IN DEXTROSE 1-5 GM/200ML-% IV SOLN
INTRAVENOUS | Status: AC
  Filled 2021-04-05: qty 200

## 2021-04-05 MED ORDER — PROMETHAZINE HCL 25 MG/ML IJ SOLN: 6.2500 mg | INTRAMUSCULAR | Status: DC | PRN

## 2021-04-05 MED ORDER — SODIUM CHLORIDE 0.9% FLUSH: 3.0000 mL | INTRAVENOUS | Status: DC | PRN

## 2021-04-05 MED ORDER — PHENOL 1.4 % MT LIQD: 1.0000 | OROMUCOSAL | Status: DC | PRN

## 2021-04-05 MED ORDER — HYDROMORPHONE HCL 1 MG/ML IJ SOLN
INTRAMUSCULAR | Status: DC | PRN
  Administered 2021-04-05: .5 mg via INTRAVENOUS

## 2021-04-05 MED ORDER — LACTATED RINGERS IV SOLN: INTRAVENOUS | Status: DC

## 2021-04-05 MED ORDER — SUCCINYLCHOLINE CHLORIDE 200 MG/10ML IV SOSY
PREFILLED_SYRINGE | INTRAVENOUS | Status: AC
  Filled 2021-04-05: qty 10

## 2021-04-05 MED ORDER — VANCOMYCIN HCL 1000 MG IV SOLR
INTRAVENOUS | Status: DC | PRN
  Administered 2021-04-05: 1000 mg

## 2021-04-05 MED ORDER — BUPIVACAINE HCL (PF) 0.25 % IJ SOLN
INTRAMUSCULAR | Status: DC | PRN
  Administered 2021-04-05: 10 mL

## 2021-04-05 MED ORDER — MIDAZOLAM HCL 5 MG/5ML IJ SOLN
INTRAMUSCULAR | Status: DC | PRN
  Administered 2021-04-05: 2 mg via INTRAVENOUS

## 2021-04-05 MED ORDER — SODIUM CHLORIDE 0.9% FLUSH
3.0000 mL | Freq: Two times a day (BID) | INTRAVENOUS | Status: DC
  Administered 2021-04-05 (×2): 3 mL via INTRAVENOUS

## 2021-04-05 MED ORDER — ROCURONIUM BROMIDE 10 MG/ML (PF) SYRINGE
PREFILLED_SYRINGE | INTRAVENOUS | Status: AC
  Filled 2021-04-05: qty 10

## 2021-04-05 MED ORDER — LOSARTAN POTASSIUM 50 MG PO TABS
100.0000 mg | ORAL_TABLET | Freq: Every day | ORAL | Status: DC
  Administered 2021-04-05: 100 mg via ORAL
  Filled 2021-04-05: qty 2

## 2021-04-05 MED ORDER — VANCOMYCIN HCL IN DEXTROSE 1-5 GM/200ML-% IV SOLN
1000.0000 mg | Freq: Once | INTRAVENOUS | Status: AC
  Administered 2021-04-05: 1000 mg via INTRAVENOUS
  Filled 2021-04-05: qty 200

## 2021-04-05 MED ORDER — THROMBIN 20000 UNITS EX SOLR
CUTANEOUS | Status: DC | PRN
  Administered 2021-04-05: 20 mL

## 2021-04-05 MED ORDER — 0.9 % SODIUM CHLORIDE (POUR BTL) OPTIME
TOPICAL | Status: DC | PRN
  Administered 2021-04-05: 1000 mL

## 2021-04-05 MED ORDER — HYDROCODONE-ACETAMINOPHEN 10-325 MG PO TABS: 1.0000 | ORAL_TABLET | ORAL | Status: DC | PRN

## 2021-04-05 MED ORDER — OXYCODONE HCL 5 MG PO TABS
10.0000 mg | ORAL_TABLET | ORAL | Status: DC | PRN
  Administered 2021-04-05 – 2021-04-06 (×7): 10 mg via ORAL
  Filled 2021-04-05 (×7): qty 2

## 2021-04-05 MED ORDER — FLEET ENEMA 7-19 GM/118ML RE ENEM: 1.0000 | ENEMA | Freq: Once | RECTAL | Status: DC | PRN

## 2021-04-05 MED ORDER — ORAL CARE MOUTH RINSE: 15.0000 mL | Freq: Once | OROMUCOSAL | Status: AC

## 2021-04-05 MED ORDER — POLYETHYLENE GLYCOL 3350 17 G PO PACK: 17.0000 g | PACK | Freq: Every day | ORAL | Status: DC | PRN

## 2021-04-05 MED ORDER — VANCOMYCIN HCL 1000 MG IV SOLR
INTRAVENOUS | Status: AC
  Filled 2021-04-05: qty 20

## 2021-04-05 MED ORDER — DEXAMETHASONE SODIUM PHOSPHATE 10 MG/ML IJ SOLN
INTRAMUSCULAR | Status: AC
  Filled 2021-04-05: qty 1

## 2021-04-05 MED ORDER — SUGAMMADEX SODIUM 200 MG/2ML IV SOLN
INTRAVENOUS | Status: DC | PRN
  Administered 2021-04-05: 200 mg via INTRAVENOUS

## 2021-04-05 MED ORDER — GABAPENTIN 300 MG PO CAPS
600.0000 mg | ORAL_CAPSULE | Freq: Three times a day (TID) | ORAL | Status: DC
  Administered 2021-04-05 (×2): 600 mg via ORAL
  Filled 2021-04-05 (×2): qty 2

## 2021-04-05 MED ORDER — PHENYLEPHRINE HCL (PRESSORS) 10 MG/ML IV SOLN
INTRAVENOUS | Status: DC | PRN
  Administered 2021-04-05 (×2): 80 ug via INTRAVENOUS

## 2021-04-05 MED ORDER — ONDANSETRON HCL 4 MG PO TABS: 4.0000 mg | ORAL_TABLET | Freq: Four times a day (QID) | ORAL | Status: DC | PRN

## 2021-04-05 MED ORDER — FENTANYL CITRATE (PF) 250 MCG/5ML IJ SOLN
INTRAMUSCULAR | Status: DC | PRN
  Administered 2021-04-05 (×3): 50 ug via INTRAVENOUS
  Administered 2021-04-05: 100 ug via INTRAVENOUS

## 2021-04-05 MED ORDER — BISACODYL 10 MG RE SUPP: 10.0000 mg | Freq: Every day | RECTAL | Status: DC | PRN

## 2021-04-05 MED ORDER — MENTHOL 3 MG MT LOZG: 1.0000 | LOZENGE | OROMUCOSAL | Status: DC | PRN

## 2021-04-05 MED ORDER — THROMBIN 20000 UNITS EX SOLR
CUTANEOUS | Status: AC
  Filled 2021-04-05: qty 20000

## 2021-04-05 MED ORDER — MIDAZOLAM HCL 2 MG/2ML IJ SOLN
INTRAMUSCULAR | Status: AC
  Filled 2021-04-05: qty 2

## 2021-04-05 MED ORDER — ONDANSETRON HCL 4 MG/2ML IJ SOLN: 4.0000 mg | Freq: Four times a day (QID) | INTRAMUSCULAR | Status: DC | PRN

## 2021-04-05 MED ORDER — OXYCODONE HCL 5 MG PO TABS: 5.0000 mg | ORAL_TABLET | Freq: Once | ORAL | Status: DC | PRN

## 2021-04-05 MED ORDER — ALBUMIN HUMAN 5 % IV SOLN: INTRAVENOUS | Status: DC | PRN

## 2021-04-05 MED ORDER — ACETAMINOPHEN 325 MG PO TABS
650.0000 mg | ORAL_TABLET | ORAL | Status: DC | PRN
  Administered 2021-04-05 – 2021-04-06 (×2): 650 mg via ORAL
  Filled 2021-04-05 (×2): qty 2

## 2021-04-05 MED ORDER — HYDROXYZINE HCL 50 MG/ML IM SOLN
50.0000 mg | Freq: Four times a day (QID) | INTRAMUSCULAR | Status: DC | PRN
  Filled 2021-04-05: qty 1

## 2021-04-05 MED ORDER — ROCURONIUM BROMIDE 10 MG/ML (PF) SYRINGE
PREFILLED_SYRINGE | INTRAVENOUS | Status: DC | PRN
  Administered 2021-04-05: 30 mg via INTRAVENOUS
  Administered 2021-04-05: 20 mg via INTRAVENOUS
  Administered 2021-04-05: 100 mg via INTRAVENOUS
  Administered 2021-04-05: 50 mg via INTRAVENOUS

## 2021-04-05 MED ORDER — PROPOFOL 10 MG/ML IV BOLUS
INTRAVENOUS | Status: AC
  Filled 2021-04-05: qty 20

## 2021-04-05 MED ORDER — PHENYLEPHRINE HCL-NACL 20-0.9 MG/250ML-% IV SOLN
INTRAVENOUS | Status: DC | PRN
  Administered 2021-04-05: 30 ug/min via INTRAVENOUS

## 2021-04-05 MED ORDER — FENTANYL CITRATE (PF) 250 MCG/5ML IJ SOLN
INTRAMUSCULAR | Status: AC
  Filled 2021-04-05: qty 5

## 2021-04-05 MED ORDER — COLCHICINE 0.6 MG PO TABS
0.6000 mg | ORAL_TABLET | Freq: Every day | ORAL | Status: DC
  Filled 2021-04-05 (×2): qty 1

## 2021-04-05 MED ORDER — HYDROMORPHONE HCL 1 MG/ML IJ SOLN
INTRAMUSCULAR | Status: AC
  Filled 2021-04-05: qty 0.5

## 2021-04-05 SURGICAL SUPPLY — 66 items
ADH SKN CLS APL DERMABOND .7 (GAUZE/BANDAGES/DRESSINGS) ×1
APL SKNCLS STERI-STRIP NONHPOA (GAUZE/BANDAGES/DRESSINGS) ×1
BAG COUNTER SPONGE SURGICOUNT (BAG) ×2 IMPLANT
BAG DECANTER FOR FLEXI CONT (MISCELLANEOUS) ×2 IMPLANT
BAG SPNG CNTER NS LX DISP (BAG) ×1
BENZOIN TINCTURE PRP APPL 2/3 (GAUZE/BANDAGES/DRESSINGS) ×2 IMPLANT
BLADE CLIPPER SURG (BLADE) IMPLANT
BONE GRAFTON DBF INJECT 6CC (Bone Implant) ×2 IMPLANT
BUR CUTTER 7.0 ROUND (BURR) IMPLANT
BUR MATCHSTICK NEURO 3.0 LAGG (BURR) ×2 IMPLANT
CANISTER SUCT 3000ML PPV (MISCELLANEOUS) ×2 IMPLANT
CAP LCK SPNE (Orthopedic Implant) ×4 IMPLANT
CAP LOCK SPINE RADIUS (Orthopedic Implant) ×4 IMPLANT
CAP LOCKING (Orthopedic Implant) ×8 IMPLANT
CARTRIDGE OIL MAESTRO DRILL (MISCELLANEOUS) ×1 IMPLANT
CNTNR URN SCR LID CUP LEK RST (MISCELLANEOUS) ×1 IMPLANT
CONT SPEC 4OZ STRL OR WHT (MISCELLANEOUS) ×2
COVER BACK TABLE 60X90IN (DRAPES) ×2 IMPLANT
DECANTER SPIKE VIAL GLASS SM (MISCELLANEOUS) ×2 IMPLANT
DERMABOND ADVANCED (GAUZE/BANDAGES/DRESSINGS) ×1
DERMABOND ADVANCED .7 DNX12 (GAUZE/BANDAGES/DRESSINGS) ×1 IMPLANT
DIFFUSER DRILL AIR PNEUMATIC (MISCELLANEOUS) ×2 IMPLANT
DRAPE C-ARM 42X72 X-RAY (DRAPES) ×4 IMPLANT
DRAPE HALF SHEET 40X57 (DRAPES) IMPLANT
DRAPE LAPAROTOMY 100X72X124 (DRAPES) ×2 IMPLANT
DRAPE SURG 17X23 STRL (DRAPES) ×8 IMPLANT
DRSG OPSITE POSTOP 4X6 (GAUZE/BANDAGES/DRESSINGS) ×2 IMPLANT
DURAPREP 26ML APPLICATOR (WOUND CARE) ×2 IMPLANT
ELECT REM PT RETURN 9FT ADLT (ELECTROSURGICAL) ×2
ELECTRODE REM PT RTRN 9FT ADLT (ELECTROSURGICAL) ×1 IMPLANT
EVACUATOR 1/8 PVC DRAIN (DRAIN) IMPLANT
GAUZE 4X4 16PLY ~~LOC~~+RFID DBL (SPONGE) IMPLANT
GAUZE SPONGE 4X4 12PLY STRL (GAUZE/BANDAGES/DRESSINGS) IMPLANT
GLOVE EXAM NITRILE XL STR (GLOVE) IMPLANT
GLOVE SURG ENC MOIS LTX SZ6.5 (GLOVE) ×2 IMPLANT
GLOVE SURG LTX SZ9 (GLOVE) ×4 IMPLANT
GLOVE SURG UNDER POLY LF SZ6.5 (GLOVE) ×2 IMPLANT
GOWN STRL REUS W/ TWL LRG LVL3 (GOWN DISPOSABLE) IMPLANT
GOWN STRL REUS W/ TWL XL LVL3 (GOWN DISPOSABLE) ×2 IMPLANT
GOWN STRL REUS W/TWL 2XL LVL3 (GOWN DISPOSABLE) IMPLANT
GOWN STRL REUS W/TWL LRG LVL3 (GOWN DISPOSABLE)
GOWN STRL REUS W/TWL XL LVL3 (GOWN DISPOSABLE) ×4
KIT BASIN OR (CUSTOM PROCEDURE TRAY) ×2 IMPLANT
KIT TURNOVER KIT B (KITS) ×2 IMPLANT
LINE SUCTION ATS CATS PLUS (LINER) ×2 IMPLANT
MILL MEDIUM DISP (BLADE) ×2 IMPLANT
NEEDLE HYPO 22GX1.5 SAFETY (NEEDLE) ×2 IMPLANT
NS IRRIG 1000ML POUR BTL (IV SOLUTION) ×2 IMPLANT
OIL CARTRIDGE MAESTRO DRILL (MISCELLANEOUS) ×2
PACK LAMINECTOMY NEURO (CUSTOM PROCEDURE TRAY) ×2 IMPLANT
PENCIL BUTTON HOLSTER BLD 10FT (ELECTRODE) ×2 IMPLANT
RASP 3.0MM (RASP) ×2 IMPLANT
ROD RADIUS 45MM (Rod) ×4 IMPLANT
ROD SPNL 45X5.5XNS TI RDS (Rod) ×2 IMPLANT
SCREW 5.75X45MM (Screw) ×4 IMPLANT
SPACER PL CATALYFT LONG 11 (Spacer) ×4 IMPLANT
SPONGE SURGIFOAM ABS GEL 100 (HEMOSTASIS) ×2 IMPLANT
STRIP CLOSURE SKIN 1/2X4 (GAUZE/BANDAGES/DRESSINGS) ×2 IMPLANT
SUT VIC AB 0 CT1 18XCR BRD8 (SUTURE) ×2 IMPLANT
SUT VIC AB 0 CT1 8-18 (SUTURE) ×4
SUT VIC AB 2-0 CT1 18 (SUTURE) ×2 IMPLANT
SUT VIC AB 3-0 SH 8-18 (SUTURE) ×4 IMPLANT
TOWEL GREEN STERILE (TOWEL DISPOSABLE) ×2 IMPLANT
TOWEL GREEN STERILE FF (TOWEL DISPOSABLE) ×2 IMPLANT
TRAY FOLEY MTR SLVR 16FR STAT (SET/KITS/TRAYS/PACK) ×2 IMPLANT
WATER STERILE IRR 1000ML POUR (IV SOLUTION) ×2 IMPLANT

## 2021-04-05 NOTE — Anesthesia Postprocedure Evaluation (Signed)
Anesthesia Post Note  Patient: Paul Morrison  Procedure(s) Performed: PLIF - L3-L4 (Back)     Patient location during evaluation: PACU Anesthesia Type: General Level of consciousness: awake and alert, oriented and patient cooperative Pain management: pain level controlled Vital Signs Assessment: post-procedure vital signs reviewed and stable Respiratory status: spontaneous breathing, nonlabored ventilation and respiratory function stable Cardiovascular status: blood pressure returned to baseline and stable Postop Assessment: no apparent nausea or vomiting Anesthetic complications: no   No notable events documented.  Last Vitals:  Vitals:   04/05/21 1155 04/05/21 1210  BP: (!) 155/108 (!) 156/85  Pulse: 64 62  Resp: 15 11  Temp:    SpO2: 91% 92%    Last Pain:  Vitals:   04/05/21 1210  TempSrc:   PainSc: [name]

## 2021-04-05 NOTE — Brief Op Note (Signed)
04/05/2021  10:58 AM  PATIENT:  Paul Morrison  65 y.o. male  PRE-OPERATIVE DIAGNOSIS:  Stenosis  POST-OPERATIVE DIAGNOSIS:  Stenosis  PROCEDURE:  Procedure(s) with comments: PLIF - L3-L4 (N/A) - PLIF - L3-L4  SURGEON:  Surgeon(s) and Role:    Earnie Larsson, MD - Primary  PHYSICIAN ASSISTANT:   ASSISTANTSMearl Latin   ANESTHESIA:   general  EBL:  450 mL   BLOOD ADMINISTERED:none  DRAINS: none   LOCAL MEDICATIONS USED:  MARCAINE     SPECIMEN:  No Specimen  DISPOSITION OF SPECIMEN:  N/A  COUNTS:  YES  TOURNIQUET:  * No tourniquets in log *  DICTATION: .Dragon Dictation  PLAN OF CARE: Admit for overnight observation  PATIENT DISPOSITION:  PACU - hemodynamically stable.   Delay start of Pharmacological VTE agent (>24hrs) due to surgical blood loss or risk of bleeding: yes

## 2021-04-05 NOTE — Progress Notes (Signed)
Pharmacy Antibiotic Note  Paul Morrison is a 64 y.o. male admitted on 04/05/2021 s/p decompressive laminotomies and foraminotomies.  Pharmacy has been consulted for vancomycin dosing for surgical prophylaxis. Since there is no drain in place, will order 1 dose of vancomycin   Plan: -Vancomycin 1 gm IV x 1 dose 12 hours post op -Pharmacy to sign off   Height: '6\' 3"'$  (190.5 cm) Weight: 113.4 kg (250 lb) IBW/kg (Calculated) : 84.5  Temp (24hrs), Avg:97.9 F (36.6 C), Min:97.6 F (36.4 C), Max:98.2 F (36.8 C)  No results for input(s): WBC, CREATININE, LATICACIDVEN, VANCOTROUGH, VANCOPEAK, VANCORANDOM, GENTTROUGH, GENTPEAK, GENTRANDOM, TOBRATROUGH, TOBRAPEAK, TOBRARND, AMIKACINPEAK, AMIKACINTROU, AMIKACIN in the last 168 hours.  Estimated Creatinine Clearance: 99.4 mL/min (by C-G formula based on SCr of 1.02 mg/dL).    Allergies  Allergen Reactions   Cephalexin Other (See Comments)    Stomach cramping    Celecoxib Rash      Thank you for allowing pharmacy to be a part of this patient's care.  Albertina Parr, PharmD., BCPS, BCCCP Clinical Pharmacist Please refer to [facility] for unit-specific pharmacist

## 2021-04-05 NOTE — H&P (Signed)
Paul Morrison is an 65 y.o. male.   Chief Complaint: Back pain HPI: 65 year old male status post prior L4-5 decompression and fusion with good results presents with worsening back and bilateral lower extremity symptoms worsened with standing or extension.  Patient gets some relief by bending forward.  Patient is failed conservative management.  He has persistent pain radiating into his anterior thighs.  He has no definite weakness.  He has feelings of dysesthesias and some numbness.  Work-up demonstrates evidence of dynamic retrolisthesis of L3 and L4 with significant foraminal stenosis and significant lateral recess stenosis.  Patient presents now for decompression and fusion at the adjacent level of L3-4.  Past Medical History:  Diagnosis Date   Arthritis    Cancer ([place])    skin cancer left ear- MOHS   Hyperlipidemia    Hypertension    Low back pain     Past Surgical History:  Procedure Laterality Date   COLONOSCOPY     HERNIA REPAIR     INJECTION KNEE     both knees   keratatomy     knee arthroscopy     LUMBAR FUSION     MOHS SURGERY     POLYPECTOMY     TOTAL KNEE ARTHROPLASTY Right 11/24/2016   Procedure: RIGHT TOTAL KNEE ARTHROPLASTY;  Surgeon: [name], MD;  Location: [facility]S;  Service: Orthopedics;  Laterality: Right;   VASECTOMY      Family History  Problem Relation Age of Onset   Hyperlipidemia Mother    Hypertension Mother    Asthma Mother    Coronary artery disease Other    Aneurysm Other    Colon cancer Neg Hx    Colon polyps Neg Hx    Esophageal cancer Neg Hx    Rectal cancer Neg Hx    Stomach cancer Neg Hx    Social History:  reports that he has quit smoking. He has never used smokeless tobacco. He reports current alcohol use. He reports that he does not use drugs.  Allergies:  Allergies  Allergen Reactions   Cephalexin Other (See Comments)    Stomach cramping    Celecoxib Rash    Facility-Administered Medications Prior to Admission   Medication Dose Route Frequency Provider Last Rate Last Admin   0.9 %  sodium chloride infusion  500 mL Intravenous Once Doran Stabler, MD       Medications Prior to Admission  Medication Sig Dispense Refill   allopurinol (ZYLOPRIM) 300 MG tablet TAKE ONE TABLET DAILY 90 tablet 0   atorvastatin (LIPITOR) 40 MG tablet TAKE ONE TABLET DAILY 90 tablet 1   colchicine 0.6 MG tablet TAKE ONE TABLET TWICE DAILY AS NEEDED FOR GOUT FLARE 60 tablet 3   gabapentin (NEURONTIN) 300 MG capsule Take 600 mg by mouth 3 (three) times daily.     HYDROmorphone (DILAUDID) 2 MG tablet Take 2 mg by mouth every 8 (eight) hours as needed for pain.     indomethacin (INDOCIN) 50 MG capsule TAKE ONE (1) CAPSULE THREE (3) TIMES EACH DAY AS NEEDED 30 capsule 0   losartan (COZAAR) 100 MG tablet TAKE ONE TABLET DAILY 90 tablet 0   sildenafil (REVATIO) 20 MG tablet TAKE 2 TO 5 TABLETS ONE HOUR PRIOR TO SEXUAL ACTIVITY 50 tablet 5   Testosterone 20.25 MG/ACT (1.62%) GEL Apply 2 pumps sprays per arm once daily early each morning. 75 g 2   ALPRAZolam (XANAX) 0.5 MG tablet Take 1 tablet (0.5 mg total) by  mouth 3 (three) times daily as needed for anxiety. (Patient not taking: Reported on 03/14/2021) 30 tablet 0   amoxicillin (AMOXIL) 500 MG capsule Take 4 tablets one hour prior to dental procedure. 4 capsule 0   HYDROcodone-acetaminophen (NORCO/VICODIN) 5-325 MG tablet Take 1-2 tablets by mouth every 4 (four) hours as needed for moderate pain. One po q 6 hours prn pain.  May refill on or after 06-25-17 (Patient not taking: Reported on 03/14/2021) 30 tablet 0   mupirocin ointment (BACTROBAN) 2 % Apply intranasal bid for 5 days. (Patient taking differently: Apply 1 application topically daily as needed (wound care).) 22 g 1    Results for orders placed or performed during the hospital encounter of 04/05/21 (from the past 48 hour(s))  Type and screen [place]     Status: None   Collection Time: 04/05/21  6:36  AM  Result Value Ref Range   ABO/RH(D) AB POS    Antibody Screen NEG    Sample Expiration      04/08/2021,2359 Performed at [facility], [place] [address], [place], [place] [address]    No results found.  Pertinent items noted in HPI and remainder of comprehensive ROS otherwise negative.  Blood pressure (!) 157/94, pulse 69, temperature 98.2 F (36.8 C), temperature source Oral, resp. rate 17, height '6\' 3"'$  (1.905 m), weight 113.4 kg, SpO2 96 %.  Patient is awake and alert.  Oriented and appropriate.  Speech is fluent.  Judgment insight are intact.  Cranial nerve function normal out of her motor examination reveals intact motor strength bilateral.  Sensory examination some decrease sensation pinprick light touch in his L3 dermatomes bilaterally left worse than right.  Gait is antalgic.  Posture is mildly flexed peer examination head ears eyes nose throat is unremarked.  Chest and abdomen are benign.  Extremities are free from injury or deformity Assessment/Plan L34 foraminal stenosis with radiculopathy.  Plan bilateral L3-4 decompressive laminotomies and foraminotomies followed posterior lumbar MRI fusion utilizing interbody cages, local harvested autograft, and augmented with posterior lateral thesis utilizing nonsegmental pedicle screw fixation local autograft.  Risks and benefits of been explained.  Patient wishes to proceed.  Mallie Mussel A Mylani Gentry 04/05/2021, 7:55 AM

## 2021-04-05 NOTE — Anesthesia Preprocedure Evaluation (Addendum)
Anesthesia Evaluation  Patient identified by MRN, date of birth, ID band Patient awake    Reviewed: Allergy & Precautions, NPO status , Patient's Chart, lab work & pertinent test results  Airway Mallampati: II  TM Distance: >3 FB Neck ROM: Full    Dental no notable dental hx. (+) Teeth Intact, Dental Advisory Given   Pulmonary  Snores at night, uses nasal strips  Has never has sleep study    Pulmonary exam normal breath sounds clear to auscultation       Cardiovascular hypertension (SBP 157 in preop, normaly 140/80), Pt. on medications Normal cardiovascular exam Rhythm:Regular Rate:Normal     Neuro/Psych negative neurological ROS  negative psych ROS   GI/Hepatic negative GI ROS, Neg liver ROS,   Endo/Other  negative endocrine ROS  Renal/GU negative Renal ROS  negative genitourinary   Musculoskeletal  (+) Arthritis , Osteoarthritis,  Lumbar stenosis    Abdominal (+) + obese,   Peds negative pediatric ROS (+)  Hematology negative hematology ROS (+)   Anesthesia Other Findings Previously on morphine- ran out 2 weeks ago  Reproductive/Obstetrics negative OB ROS                            Anesthesia Physical Anesthesia Plan  ASA: 2  Anesthesia Plan: General   Post-op Pain Management:    Induction: Intravenous  PONV Risk Score and Plan: 2 and Ondansetron, Dexamethasone, Midazolam and Treatment may vary due to age or medical condition  Airway Management Planned: Oral ETT  Additional Equipment: None  Intra-op Plan:   Post-operative Plan: Extubation in OR  Informed Consent: I have reviewed the patients History and Physical, chart, labs and discussed the procedure including the risks, benefits and alternatives for the proposed anesthesia with the patient or authorized representative who has indicated his/her understanding and acceptance.     Dental advisory given  Plan Discussed  with: CRNA  Anesthesia Plan Comments:         Anesthesia Quick Evaluation

## 2021-04-05 NOTE — Op Note (Signed)
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  Postoperative diagnosis: Same  Procedure Name: Bilateral L3-4 decompressive laminotomies, foraminotomies, redo, more than would be required for simple interbody fusion alone.  Bilateral L3-L4 ponte osteotomies for restoration of sagittal plane balance and complete foraminal decompression  L3-4 posterior lumbar interbody fusion utilizing interbody cages and locally harvested autograft and allograft  L3-4 posterior lateral arthrodesis utilizing nonsegmental pedicle screw fixation and local autograft  Removal of L5 and S1 instrumentation  Surgeon:Paul Morrison, M.D.  Asst. Surgeon: Reinaldo Meeker, NP  Anesthesia: General  Indication: Patient is a 65 year old male who is remotely status post L4-S1 decompression and fusion with good results.  He presents now with worsening back and bilateral lower extremity pain mostly consistent with a left-sided L3 radiculopathy but also with right-sided symptoms as well.  Patient is failed conservative manage.  Work-up demonstrates evidence of a dynamic retrolisthesis of L3-4 with severe foraminal stenosis secondary to facet disease and his retrolisthesis.  Patient is failed conservative management presents now for decompression fusion L3-4.  Operative note: After induction anesthesia, patient position prone onto Wilson frame appropriate padded.  Lumbar region prepped and draped sterilely.  Incision made from L3 down to S1.  Dissection performed bilaterally.  Retractor placed.  Fluoroscopy used.  Levels confirmed.  Previously placed pedicle screws potation at L5, L4 and S1 was disassembled and the rod and transverse connector was removed.  The fusion at L4-5 and L5-S1 were inspected and found to be quite solid.  The screws at L5 and  S1 were removed.  Attention then placed to the L3-4 level.  The inferior aspect of the spinous processes were resected as was the inferior two thirds of the lamina of L3 bilaterally.  Complete bilateral inferior facetectomies including all aspects of the pars interarticularis were performed bilaterally.  Superior facetectomies of L4 were performed bilaterally.  The facetectomy was taken out laterally and complete ponte osteotomies were performed so that the interspace could be mobilized and realigned.  Disc base was then dissected free.  Bilateral discectomy was then performed.  Dissipates then prepared for interbody fusion.  With a distractor placed the patient's right side to space prepared on the left side and cleaned of all soft tissue.  A 11 mm [employer] expandable cage was then impacted into place and expanded.  Distractor removed patient's right side.  To space prepared on the right side.  Morselized autograft packed in the interspace.  A second cage was then impacted in place and expanded.  Pedicles of L3 were identified using surface landmarks and intraoperative fluoroscopy and superficial bone around the pedicle was then removed using high-speed drill.  Pedicles then probed using pedicle all each pedicle tract was then probed and found to be solidly within the bone.  Each pedicle tract was then tapped with a screw tap.  Screw drill hole was probed and found to be solidly within the bone.  5.75 mm radius brand screws from [employer] were placed bilaterally at L3.  Transverse processes were decorticated.  Morselized autograft was post packed posterior laterally.  Final images reveal good position of the cages and hardware with proper upper level with normal alignment of spine.  Short segment titanium rod placed through the screw heads at L4 and L5.  Locking caps placed over the screws were locked caps and engaged with a construct under compression.  Graft done putty was then injected in each cage  bilaterally.  Gelfoam was placed over the laminotomy defect.  Wound is then closed in layers with Vicryl sutures.  Steri-Strips and sterile dressing were applied.  No apparent complications.  Patient tolerated the procedure well and he returns to recovery room postop.

## 2021-04-05 NOTE — Progress Notes (Signed)
Orthopedic Tech Progress Note Patient Details:  Paul Morrison 20-Jun-1956 [id number]  RN stated " patient has brace"    Patient ID: Paul Morrison, male   DOB: 02-Nov-1955, 65 y.o.   MRN: [id number]  Paul Morrison 04/05/2021, 2:09 PM

## 2021-04-05 NOTE — Anesthesia Procedure Notes (Addendum)
Procedure Name: Intubation Date/Time: 04/05/2021 8:36 AM Performed by: Lavell Luster, CRNA Pre-anesthesia Checklist: Patient identified, Emergency Drugs available, Suction available, Patient being monitored and Timeout performed Patient Re-evaluated:Patient Re-evaluated prior to induction Oxygen Delivery Method: Circle system utilized Preoxygenation: Pre-oxygenation with 100% oxygen Induction Type: IV induction Ventilation: Mask ventilation without difficulty Laryngoscope Size: Mac and 4 Grade View: Grade I Tube type: Oral Tube size: 7.5 mm Number of attempts: 1 Airway Equipment and Method: Stylet Placement Confirmation: ETT inserted through vocal cords under direct vision, positive ETCO2 and breath sounds checked- equal and bilateral Secured at: 22 cm Tube secured with: Tape Dental Injury: Teeth and Oropharynx as per pre-operative assessment

## 2021-04-05 NOTE — Evaluation (Signed)
Occupational Therapy Evaluation Patient Details Name: Paul Morrison MRN: [id number] DOB: Jun 25, 1956 Today's Date: 04/05/2021    History of Present Illness 65 y.o. male presenting for elective L4-S1 PLIF 9/6 by Dr. Annette Stable. PMHx significant for OA, previous lumbar fusion, R TKA 2018, Hx of skin cancer, HLD, HTN and lumbago.   Clinical Impression   PTA patient was living with his spouse in a 2-level private residence and was grossly I with ADLs/IADLs without AD. 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 Patient currently presents slightly below baseline level of function demonstrating bed mobility, sit to stand transfers and lateral steps toward [facility] with supervision to Min guard and use of RW. OT provided written and verbal education on spinal precautions, home set-up to maximize safety and independence with self-care tasks, and acquisition/use of AE. Patient expressed verbal understanding but would benefit from continued education. Further mobility limited 2/2 lethargy and "wooziness" upon standing. Wife to deliver spinal brace prior to therapy sessions tomorrow. OT to see patient for 1 more follow-up session prior to return home.      Follow Up Recommendations  No OT follow up    Equipment Recommendations  None recommended by OT    Recommendations for Other Services       Precautions / Restrictions Precautions Precautions: Back Precaution Booklet Issued: Yes (comment) Precaution Comments: Written and verbal information on back precautions privoded. Fair return demo. Required Braces or Orthoses: Spinal Brace Spinal Brace: Lumbar corset;Other (comment) Spinal Brace Comments: Not present in room at time of evaluation. Wife to bring brace prior to therapy session tomorrow morning. Restrictions Weight Bearing Restrictions: No      Mobility Bed Mobility Overal bed mobility: Needs Assistance Bed Mobility: Rolling;Sidelying to Sit;Sit to  Supine Rolling: Supervision Sidelying to sit: Supervision   Sit to supine: Supervision   General bed mobility comments: Supervision A grossly +rail after education on log rolling technique.    Transfers Overall transfer level: Needs assistance Equipment used: Rolling walker (2 wheeled) Transfers: Sit to/from Stand Sit to Stand: Min guard         General transfer comment: Min guard for sit to stand from EOB to RW with cues for hand placement.    Balance Overall balance assessment: Mild deficits observed, not formally tested                                         ADL either performed or assessed with clinical judgement   ADL Overall ADL's : Needs assistance/impaired                                       General ADL Comments: Session limited by lethargy and "wooziness" upon standing. Will continue to assess.     Vision Baseline Vision/History: 1 Wears glasses Ability to See in Adequate Light: 0 Adequate Patient Visual Report: No change from baseline Vision Assessment?: No apparent visual deficits     Perception     Praxis      Pertinent Vitals/Pain Pain Assessment: 0-10 Pain Score: 7  Pain Location: Low back (incision) Pain Descriptors / Indicators: Aching;Sore;Operative site guarding;Grimacing Pain Intervention(s): Limited activity within patient's tolerance;Monitored during session;Premedicated before session;Repositioned;Other (comment) (IV pain meds given prior to start of session.)     Hand Dominance Right  Extremity/Trunk Assessment Upper Extremity Assessment Upper Extremity Assessment: Overall WFL for tasks assessed   Lower Extremity Assessment Lower Extremity Assessment: Defer to PT evaluation   Cervical / Trunk Assessment Cervical / Trunk Assessment: Other exceptions Cervical / Trunk Exceptions: s/p lumbar surgery   Communication Communication Communication: No difficulties   Cognition Arousal/Alertness:  Awake/alert;Lethargic (Initially alert but became lethargic as session progressed.) Behavior During Therapy: WFL for tasks assessed/performed Overall Cognitive Status: Within Functional Limits for tasks assessed                                     General Comments  Clean/dry dressing at incision.    Exercises     Shoulder Instructions      Home Living Family/patient expects to be discharged to:: Private residence Living Arrangements: Spouse/significant other Available Help at Discharge: Family Type of Home: House Home Access: Stairs to enter [employer] of Steps: 1 step at front entry with unilateral rail; 3 steps down at alterante entry with ?unilateral rail?   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        OT Problem List: Impaired balance (sitting and/or standing);Other (comment);Decreased knowledge of use of DME or AE (Decreased ADLs)      OT Treatment/Interventions: Self-care/ADL training;Therapeutic exercise;Energy conservation;DME and/or AE instruction;Therapeutic activities;Patient/family education;Balance training    OT Goals(Current goals can be found in the care plan section) Acute Rehab OT Goals Patient Stated Goal: To return home. OT Goal Formulation: With patient Time For Goal Achievement: 04/19/21 Potential to Achieve Goals: Good ADL Goals Additional ADL Goal #1: Patient will complete ADLs with Mod I and AD/DME PRN in prep for safe d/c  home. Additional ADL Goal #2: Patient will recall 3/3 back precautions with good carryover during ADLs.  OT Frequency: Min 2X/week   Barriers to D/C:            Co-evaluation              AM-PAC OT "6 Clicks" Daily Activity     Outcome Measure Help from another person eating meals?: None Help from another person taking care of personal grooming?: None Help from another person toileting, which includes using toliet, bedpan, or urinal?: A Little Help from another person bathing (including washing, rinsing, drying)?: A Little Help from another person to put on and taking off regular upper body clothing?: A Little Help from another person to put on and taking off regular lower body clothing?: A Little 6 Click Score: 20   End of Session Equipment Utilized During Treatment: Gait belt;Rolling walker Nurse Communication: Mobility status  Activity Tolerance: Patient limited by lethargy;Other (comment) (Limited by "wooziness" upon standing.) Patient left: in bed;with call bell/phone within reach;with bed alarm set  OT Visit Diagnosis: Muscle weakness (generalized) (M62.81)                Time: [id number] OT Time Calculation (min): 22 min Charges:  OT General Charges $OT Visit: 1 Visit OT Evaluation $OT Eval Low Complexity: 1 Low OT Treatments $Therapeutic Activity: 8-22 mins  Paul Morrison H. OTR/L Supplemental OT, Department of rehab services [phone]  Hobie Kohles R H. 04/05/2021, 2:54 PM

## 2021-04-05 NOTE — Transfer of Care (Signed)
Immediate Anesthesia Transfer of Care Note  Patient: Paul Morrison  Procedure(s) Performed: PLIF - L3-L4 (Back)  Patient Location: PACU  Anesthesia Type:General  Level of Consciousness: awake, alert  and oriented  Airway & Oxygen Therapy: Patient connected to nasal cannula oxygen and Patient connected to face mask oxygen  Post-op Assessment: Post -op Vital signs reviewed and stable  Post vital signs: stable  Last Vitals:  Vitals Value Taken Time  BP 153/93 04/05/21 1110  Temp    Pulse 70 04/05/21 1111  Resp 15 04/05/21 1111  SpO2 98 % 04/05/21 1111  Vitals shown include unvalidated device data.  Last Pain:  Vitals:   04/05/21 0642  TempSrc:   PainSc: 0-No pain         Complications: No notable events documented.

## 2021-04-06 DIAGNOSIS — M4316 Spondylolisthesis, lumbar region: Secondary | ICD-10-CM | POA: Diagnosis not present

## 2021-04-06 MED ORDER — OXYCODONE HCL 10 MG PO TABS: 10.0000 mg | ORAL_TABLET | ORAL | 0 refills | Status: DC | PRN

## 2021-04-06 MED ORDER — DIAZEPAM 5 MG PO TABS: 5.0000 mg | ORAL_TABLET | Freq: Four times a day (QID) | ORAL | 0 refills | Status: DC | PRN

## 2021-04-06 NOTE — Discharge Summary (Signed)
Physician Discharge Summary  Patient ID: Paul Morrison MRN: [id number] DOB/AGE: [birth year] 64 y.o.  Admit date: 04/05/2021 Discharge date: 04/06/2021  Admission Diagnoses:  Discharge Diagnoses:  Active Problems:   Degenerative spondylolisthesis   Discharged Condition: good  Hospital Course: Patient admitted to the hospital where he underwent uncomplicated [id number] decompression and fusion.  Postoperatively doing well.  Preoperative back and lower extremity pain much improved.  Standing ambulating and voiding without difficulty.  Patient ready for discharge home.  Consults:   Significant Diagnostic Studies:   Treatments:   Discharge Exam: Blood pressure 128/71, pulse 78, temperature 97.9 F (36.6 C), resp. rate 18, height '6\' 3"'$  (1.905 m), weight 113.4 kg, SpO2 96 %. Awake and alert.  Oriented and appropriate.  Motor and sensory function intact.  Wound clean and dry.  Chest and abdomen benign.  Disposition: Discharge disposition: 01-Home or Self Care        Allergies as of 04/06/2021       Reactions   Cephalexin Other (See Comments)   Stomach cramping    Celecoxib Rash        Medication List     TAKE these medications    allopurinol 300 MG tablet Commonly known as: ZYLOPRIM TAKE ONE TABLET DAILY   ALPRAZolam 0.5 MG tablet Commonly known as: Xanax Take 1 tablet (0.5 mg total) by mouth 3 (three) times daily as needed for anxiety.   amoxicillin 500 MG capsule Commonly known as: AMOXIL Take 4 tablets one hour prior to dental procedure.   atorvastatin 40 MG tablet Commonly known as: LIPITOR TAKE ONE TABLET DAILY   colchicine 0.6 MG tablet TAKE ONE TABLET TWICE DAILY AS NEEDED FOR GOUT FLARE   diazepam 5 MG tablet Commonly known as: VALIUM Take 1-2 tablets (5-10 mg total) by mouth every 6 (six) hours as needed for muscle spasms.   gabapentin 300 MG capsule Commonly known as: NEURONTIN Take 600 mg by mouth 3 (three) times daily.    HYDROcodone-acetaminophen 5-325 MG tablet Commonly known as: NORCO/VICODIN Take 1-2 tablets by mouth every 4 (four) hours as needed for moderate pain. One po q 6 hours prn pain.  May refill on or after 06-25-17   HYDROmorphone 2 MG tablet Commonly known as: DILAUDID Take 2 mg by mouth every 8 (eight) hours as needed for pain.   indomethacin 50 MG capsule Commonly known as: INDOCIN TAKE ONE (1) CAPSULE THREE (3) TIMES EACH DAY AS NEEDED   losartan 100 MG tablet Commonly known as: COZAAR TAKE ONE TABLET DAILY   mupirocin ointment 2 % Commonly known as: Bactroban Apply intranasal bid for 5 days. What changed:  how much to take how to take this when to take this reasons to take this additional instructions   Oxycodone HCl 10 MG Tabs Take 1 tablet (10 mg total) by mouth every 3 (three) hours as needed for severe pain ((score 7 to 10)).   sildenafil 20 MG tablet Commonly known as: REVATIO TAKE 2 TO 5 TABLETS ONE HOUR PRIOR TO SEXUAL ACTIVITY   Testosterone 20.25 MG/ACT (1.62%) Gel Apply 2 pumps sprays per arm once daily early each morning.               Durable Medical Equipment  (From admission, onward)           Start     Ordered   04/05/21 1307  DME Walker rolling  Once       Question:  Patient needs a walker to treat with the  following condition  Answer:  Degenerative spondylolisthesis   04/05/21 1306   04/05/21 1307  DME 3 n 1  Once        04/05/21 1306             Signed: [place] 04/06/2021, 8:30 AM

## 2021-04-06 NOTE — Progress Notes (Signed)
Occupational Therapy Treatment Patient Details Name: Paul Morrison MRN: [id number] DOB: June 14, 1956 Today's Date: 04/06/2021    History of present illness 65 y.o. male presenting for elective L4-S1 PLIF 9/6 by Dr. Annette Stable. PMHx significant for OA, previous lumbar fusion, R TKA 2018, Hx of skin cancer, HLD, HTN and lumbago.   OT comments  Patient met lying supine in bed. Patient reports inability to recall working with this Probation officer yesterday. Reviewed back precautions and log rolling technique. Patient advanced from supine to EOB with good recall of log rolling technique. Able to don UB clothing including lumbar corset with set-up assist and LB clothing in sitting/standing with supervision A for safety. Follow-up education provided on home set-up to maximize safety and independence with self-care tasks, and acquisition/use of AE. Patient expressed verbal understanding. Plan for d/c home with wife later this date.      Follow Up Recommendations  No OT follow up    Equipment Recommendations  None recommended by OT    Recommendations for Other Services      Precautions / Restrictions Precautions Precautions: Back Precaution Booklet Issued: Yes (comment) Precaution Comments: Reviewed back preacutions. Able to recall 3/3 at conclusion of session. Required Braces or Orthoses: Spinal Brace Spinal Brace: Lumbar corset;Applied in standing position Restrictions Weight Bearing Restrictions: No       Mobility Bed Mobility Overal bed mobility: Needs Assistance Bed Mobility: Rolling;Sidelying to Sit;Sit to Supine Rolling: Supervision Sidelying to sit: Supervision   Sit to supine: Supervision   General bed mobility comments: Supervision A grossly +rail after education on log rolling technique.    Transfers Overall transfer level: Needs assistance Equipment used: None Transfers: Sit to/from Stand Sit to Stand: Supervision         General transfer comment: Supervision A for sit to  stand from EOB. Good recall for hand placement.    Balance Overall balance assessment: Mild deficits observed, not formally tested                                         ADL either performed or assessed with clinical judgement   ADL Overall ADL's : Needs assistance/impaired                 Upper Body Dressing : Set up;Sitting   Lower Body Dressing: Supervision/safety;Sit to/from stand Lower Body Dressing Details (indicate cue type and reason): Able to thead BLE through underwear/pants and hike over hips in standing with supervision A for safety. Toilet Transfer: Magazine features editor Details (indicate cue type and reason): Simulated with transfer to recliner. Pateint reaching out for furniture to steady. Education on use of RW up return home. Patient expressed verbal understanding.           General ADL Comments: Session limited by lethargy and "wooziness" upon standing. Will continue to assess.     Vision       Perception     Praxis      Cognition Arousal/Alertness: Awake/alert Behavior During Therapy: WFL for tasks assessed/performed Overall Cognitive Status: Within Functional Limits for tasks assessed                                          Exercises     Shoulder Instructions       General Comments  Noted swelling above incision. RN and MD aware.    Pertinent Vitals/ Pain       Pain Assessment: 0-10 Pain Score: 4  Pain Location: Low back (incision) Pain Descriptors / Indicators: Aching;Sore;Operative site guarding;Grimacing Pain Intervention(s): Limited activity within patient's tolerance;Monitored during session;Premedicated before session;Repositioned  Home Living                                          Prior Functioning/Environment              Frequency  Min 2X/week        Progress Toward Goals  OT Goals(current goals can now be found in the care plan section)  Progress  towards OT goals: Progressing toward goals  Acute Rehab OT Goals Patient Stated Goal: To return home. OT Goal Formulation: With patient Time For Goal Achievement: 04/19/21 Potential to Achieve Goals: Good ADL Goals Additional ADL Goal #1: Patient will complete ADLs with Mod I and AD/DME PRN in prep for safe d/c home. Additional ADL Goal #2: Patient will recall 3/3 back precautions with good carryover during ADLs.  Plan Discharge plan remains appropriate    Co-evaluation                 AM-PAC OT "6 Clicks" Daily Activity     Outcome Measure   Help from another person eating meals?: None Help from another person taking care of personal grooming?: None Help from another person toileting, which includes using toliet, bedpan, or urinal?: A Little Help from another person bathing (including washing, rinsing, drying)?: A Little Help from another person to put on and taking off regular upper body clothing?: A Little Help from another person to put on and taking off regular lower body clothing?: A Little 6 Click Score: 20    End of Session Equipment Utilized During Treatment: Gait belt;Rolling walker  OT Visit Diagnosis: Muscle weakness (generalized) (M62.81)   Activity Tolerance Patient tolerated treatment well   Patient Left with call bell/phone within reach;in chair   Nurse Communication Mobility status        Time: 3734-2876 OT Time Calculation (min): 19 min  Charges: OT General Charges $OT Visit: 1 Visit OT Treatments $Self Care/Home Management : 8-22 mins  Ticia Virgo H. OTR/L Supplemental OT, Department of rehab services [phone]   Claudetta Sallie R H. 04/06/2021, 8:04 AM

## 2021-04-06 NOTE — Progress Notes (Signed)
Patient was transported via wheelchair by NT and RN for discharge home; complaints of mild pain on his back and was medicated before he went; dressing on his back incision was changed and clean, dry and intact; room was checked and accounted for all his belongings; discharge instructions given to patient and his wife and both verbalized understanding on the instructions given.

## 2021-04-06 NOTE — Discharge Instructions (Addendum)

## 2021-04-06 NOTE — Evaluation (Signed)
Physical Therapy Evaluation Patient Details Name: Paul Morrison MRN: [id number] DOB: 1956-04-03 Today's Date: 04/06/2021   History of Present Illness  65 y.o. male presenting for elective L4-S1 PLIF 9/6 by Dr. Annette Stable. PMHx significant for OA, previous lumbar fusion, R TKA 2018, Hx of skin cancer, HLD, HTN and lumbago.   Clinical Impression  PT eval complete. Pt required supervision transfers, min guard assist ambulation 200' without AD, and min guard assist ascend/descend 12 steps with 1 rail. Pt would benefit from RW for community distances. All education complete. Pt to d/c home today. PT signing off.    Follow Up Recommendations No PT follow up    Equipment Recommendations  Rolling walker with 5" wheels    Recommendations for Other Services       Precautions / Restrictions Precautions Precautions: Back Precaution Booklet Issued: Yes (comment) Precaution Comments: Pt able to independently recall 3/3 back precautions. Required Braces or Orthoses: Spinal Brace Spinal Brace: Lumbar corset;Applied in standing position Restrictions Weight Bearing Restrictions: No      Mobility  Bed Mobility Overal bed mobility: Needs Assistance Bed Mobility: Rolling;Sidelying to Sit;Sit to Supine Rolling: Supervision Sidelying to sit: Supervision   Sit to supine: Supervision   General bed mobility comments: Pt up in recliner.    Transfers Overall transfer level: Needs assistance Equipment used: None Transfers: Sit to/from Stand Sit to Stand: Supervision         General transfer comment: supervision for safety, cues for technique.  Ambulation/Gait Ambulation/Gait assistance: Min guard Gait Distance (Feet): 200 Feet Assistive device: None Gait Pattern/deviations: Step-through pattern;Decreased stride length Gait velocity: decreased Gait velocity interpretation: 1.31 - 2.62 ft/sec, indicative of limited community ambulator General Gait Details: slow, guarded gait. Recommend RW  for increased stability for community distances.  Stairs Stairs: Yes Stairs assistance: Min guard Stair Management: One rail Right;Alternating pattern;Forwards Number of Stairs: 12    Wheelchair Mobility    Modified Rankin (Stroke Patients Only)       Balance Overall balance assessment: Mild deficits observed, not formally tested                                           Pertinent Vitals/Pain Pain Assessment: 0-10 Pain Score: 3  Pain Location: Low back (incision) Pain Descriptors / Indicators: Discomfort Pain Intervention(s): Monitored during session;Repositioned    Home Living Family/patient expects to be discharged to:: Private residence Living Arrangements: Spouse/significant other Available Help at Discharge: Family Type of Home: House Home Access: Stairs to enter   [employer] of Steps: 1 step at front entry with unilateral rail; 3 steps down at alterante entry with ?unilateral rail? 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     Hand Dominance   Dominant Hand: Right    Extremity/Trunk Assessment   Upper Extremity Assessment Upper Extremity Assessment: Overall WFL for tasks assessed    Lower Extremity Assessment Lower Extremity Assessment: Overall WFL for tasks assessed    Cervical / Trunk Assessment Cervical / Trunk Assessment: Other exceptions Cervical / Trunk Exceptions: s/p lumbar surgery  Communication   Communication:  No difficulties  Cognition Arousal/Alertness: Awake/alert Behavior During Therapy: WFL for tasks assessed/performed Overall Cognitive Status: Within Functional Limits for tasks assessed                                         General Comments General comments (skin integrity, edema, etc.): Noted swelling above incision. RN and MD aware.    Exercises     Assessment/Plan    PT Assessment Patent does not need any further PT services  PT Problem List         PT Treatment Interventions      PT Goals (Current goals can be found in the Care Plan section)  Acute Rehab PT Goals Patient Stated Goal: home today PT Goal Formulation: All assessment and education complete, DC therapy    Frequency     Barriers to discharge        Co-evaluation               AM-PAC PT "6 Clicks" Mobility  Outcome Measure Help needed turning from your back to your side while in a flat bed without using bedrails?: None Help needed moving from lying on your back to sitting on the side of a flat bed without using bedrails?: None Help needed moving to and from a bed to a chair (including a wheelchair)?: A Little Help needed standing up from a chair using your arms (e.g., wheelchair or bedside chair)?: A Little Help needed to walk in hospital room?: A Little Help needed climbing 3-5 steps with a railing? : A Little 6 Click Score: 20    End of Session Equipment Utilized During Treatment: Gait belt;Back brace Activity Tolerance: Patient tolerated treatment well Patient left: in chair;with call bell/phone within reach;with family/visitor present Nurse Communication: Mobility status PT Visit Diagnosis: Difficulty in walking, not elsewhere classified (R26.2);Pain    Time: [id number] PT Time Calculation (min) (ACUTE ONLY): 25 min   Charges:   PT Evaluation $PT Eval Moderate Complexity: 1 Mod PT Treatments $Gait Training: 8-22 mins        Lorrin Goodell, PT  Office # [phone] Pager [phone]   Paul Morrison 04/06/2021, 9:14 AM

## 2021-04-08 MED FILL — Heparin Sodium (Porcine) Inj 1000 Unit/ML: INTRAMUSCULAR | Qty: 30 | Status: AC

## 2021-04-08 MED FILL — Sodium Chloride IV Soln 0.9%: INTRAVENOUS | Qty: 1000 | Status: AC

## 2021-04-08 MED FILL — Sodium Chloride Irrigation Soln 0.9%: Qty: 3000 | Status: AC

## 2021-04-10 ENCOUNTER — Emergency Department (HOSPITAL_COMMUNITY)
Admission: EM | Admit: 2021-04-10 | Discharge: 2021-04-10 | Disposition: A | Discharge status: Home / Self Care | Payer: BC Managed Care – PPO | Attending: Emergency Medicine | Admitting: Emergency Medicine

## 2021-04-10 ENCOUNTER — Ambulatory Visit (HOSPITAL_COMMUNITY)
Admission: RE | Admit: 2021-04-10 | Discharge: 2021-04-10 | Disposition: A | Discharge status: Other Acute Inpatient Hospital | Payer: BC Managed Care – PPO | Source: Ambulatory Visit | Attending: Family Medicine | Admitting: Family Medicine

## 2021-04-10 ENCOUNTER — Other Ambulatory Visit: Payer: Self-pay

## 2021-04-10 ENCOUNTER — Encounter (HOSPITAL_COMMUNITY): Payer: Self-pay

## 2021-04-10 VITALS — BP 127/66 | HR 95 | Temp 98.3°F | Resp 19

## 2021-04-10 DIAGNOSIS — Z87891 Personal history of nicotine dependence: Secondary | ICD-10-CM | POA: Insufficient documentation

## 2021-04-10 DIAGNOSIS — R339 Retention of urine, unspecified: Secondary | ICD-10-CM | POA: Diagnosis present

## 2021-04-10 DIAGNOSIS — K5900 Constipation, unspecified: Secondary | ICD-10-CM | POA: Diagnosis not present

## 2021-04-10 DIAGNOSIS — R338 Other retention of urine: Secondary | ICD-10-CM | POA: Diagnosis not present

## 2021-04-10 DIAGNOSIS — K649 Unspecified hemorrhoids: Secondary | ICD-10-CM | POA: Diagnosis not present

## 2021-04-10 DIAGNOSIS — N9989 Other postprocedural complications and disorders of genitourinary system: Secondary | ICD-10-CM

## 2021-04-10 DIAGNOSIS — I10 Essential (primary) hypertension: Secondary | ICD-10-CM | POA: Insufficient documentation

## 2021-04-10 DIAGNOSIS — K644 Residual hemorrhoidal skin tags: Secondary | ICD-10-CM

## 2021-04-10 LAB — URINALYSIS, MICROSCOPIC (REFLEX)
Bacteria, UA: NONE SEEN
Squamous Epithelial / HPF: NONE SEEN (ref 0–5)

## 2021-04-10 LAB — COMPREHENSIVE METABOLIC PANEL
ALT: 58 U/L — ABNORMAL HIGH (ref 0–44)
AST: 39 U/L (ref 15–41)
Albumin: 2.9 g/dL — ABNORMAL LOW (ref 3.5–5.0)
Alkaline Phosphatase: 49 U/L (ref 38–126)
Anion gap: 8 (ref 5–15)
BUN: 7 mg/dL — ABNORMAL LOW (ref 8–23)
CO2: 30 mmol/L (ref 22–32)
Calcium: 9.2 mg/dL (ref 8.9–10.3)
Chloride: 95 mmol/L — ABNORMAL LOW (ref 98–111)
Creatinine, Ser: 0.94 mg/dL (ref 0.61–1.24)
GFR, Estimated: >60 mL/min (ref >60)
Glucose, Bld: 117 mg/dL — ABNORMAL HIGH (ref 70–99)
Potassium: 3.9 mmol/L (ref 3.5–5.1)
Sodium: 133 mmol/L — ABNORMAL LOW (ref 135–145)
Total Bilirubin: 0.6 mg/dL (ref 0.3–1.2)
Total Protein: 6.2 g/dL — ABNORMAL LOW (ref 6.5–8.1)

## 2021-04-10 LAB — CBC WITH DIFFERENTIAL/PLATELET
Abs Immature Granulocytes: 0.03 10*3/uL (ref 0.00–0.07)
Basophils Absolute: 0 10*3/uL (ref 0.0–0.1)
Basophils Relative: 0 %
Eosinophils Absolute: 0.2 10*3/uL (ref 0.0–0.5)
Eosinophils Relative: 2 %
HCT: 41.5 % (ref 39.0–52.0)
Hemoglobin: 13.9 g/dL (ref 13.0–17.0)
Immature Granulocytes: 0 %
Lymphocytes Relative: 15 %
Lymphs Abs: 1.2 10*3/uL (ref 0.7–4.0)
MCH: 31.6 pg (ref 26.0–34.0)
MCHC: 33.5 g/dL (ref 30.0–36.0)
MCV: 94.3 fL (ref 80.0–100.0)
Monocytes Absolute: 0.9 10*3/uL (ref 0.1–1.0)
Monocytes Relative: 11 %
Neutro Abs: 6.1 10*3/uL (ref 1.7–7.7)
Neutrophils Relative %: 72 %
Platelets: 199 10*3/uL (ref 150–400)
RBC: 4.4 MIL/uL (ref 4.22–5.81)
RDW: 13.2 % (ref 11.5–15.5)
WBC: 8.4 10*3/uL (ref 4.0–10.5)
nRBC: 0 % (ref 0.0–0.2)

## 2021-04-10 LAB — URINALYSIS, ROUTINE W REFLEX MICROSCOPIC
Bilirubin Urine: NEGATIVE
Glucose, UA: NEGATIVE mg/dL
Ketones, ur: NEGATIVE mg/dL
Leukocytes,Ua: NEGATIVE
Nitrite: NEGATIVE
Protein, ur: NEGATIVE mg/dL
Specific Gravity, Urine: <1.005 — ABNORMAL LOW (ref 1.005–1.030)
pH: 6 (ref 5.0–8.0)

## 2021-04-10 MED ORDER — LINACLOTIDE 72 MCG PO CAPS: 72.0000 ug | ORAL_CAPSULE | Freq: Every day | ORAL | 0 refills | Status: DC

## 2021-04-10 MED ORDER — SENNOSIDES-DOCUSATE SODIUM 8.6-50 MG PO TABS: 1.0000 | ORAL_TABLET | Freq: Every evening | ORAL | 0 refills | Status: DC | PRN

## 2021-04-10 MED ORDER — MORPHINE SULFATE (PF) 4 MG/ML IV SOLN
4.0000 mg | Freq: Once | INTRAVENOUS | Status: AC
  Administered 2021-04-10: 4 mg via INTRAVENOUS
  Filled 2021-04-10: qty 1

## 2021-04-10 MED ORDER — HYDROCORTISONE ACETATE 25 MG RE SUPP: 25.0000 mg | Freq: Two times a day (BID) | RECTAL | 0 refills | Status: DC

## 2021-04-10 NOTE — ED Provider Notes (Addendum)
Emergency Medicine Provider Triage Evaluation Note  Paul Morrison , a 65 y.o. male  was evaluated in triage.  Pt complains of urinary retention for the past 2 days.  Does report having some bleeding episodes throughout the night but feels that he has not fully relieved his bladder.  Does report some pressure around the bladder area.  Has been taking morphine along with Dilaudid for pain control around-the-clock as he reports.  Surgery was performed by Dr. Trenton Gammon, patient had surgery on Monday, discharged on Tuesday but symptoms began on Friday.  Severe tenderness to palpation along the lower back, especially with sitting forward.  Review of Systems  Positive: Back pain, urinary retention Negative: Diarrhea, chest pain  Physical Exam  BP (!) 142/82 (BP Location: Right Arm)   Pulse 100   Temp 98.2 F (36.8 C) (Oral)   Resp 14   SpO2 98%  Gen:   Awake, no distress   Resp:  Normal effort  MSK:   Moves extremities without difficulty  Other:  Limited range of motion to bilateral lower extremities.  Pain with palpation along the lumbar spine.  Medical Decision Making  Medically screening exam initiated at 5:47 PM.  Appropriate orders placed.  [name] was informed that the remainder of the evaluation will be completed by another provider, this initial triage assessment does not replace that evaluation, and the importance of remaining in the ED until their evaluation is complete.  Patient here status post spinal fusion at L3-L4 by Dr. Trenton Gammon last Monday.  Presenting with urinary retention, has not fully voided in the past 3 days, endorses severe pressure to his bladder area.  Not been running any fevers, has been taking Dilaudid 2 mg for pain control with every 8 hours, unknown when he last took this medication as he reports "my wife keeps up with this ".  Due to the severity of his condition, he was prompted to a room in order to be fully evaluated and began work-up.   Janeece Fitting,  PA-C 04/10/21 1748    Janeece Fitting, PA-C 04/10/21 1749    Margette Fast, MD 04/17/21 1740

## 2021-04-10 NOTE — ED Notes (Signed)
Assisted provider in rectal exam

## 2021-04-10 NOTE — ED Notes (Signed)
E-signature pad unavailable at time of pt discharge. This RN discussed discharge materials with pt and answered all pt questions. Pt stated understanding of discharge material. ? ?

## 2021-04-10 NOTE — ED Triage Notes (Signed)
Pt presents with urinary retention x 2 days. Pt states he has been trying and has been unsuccessful, states he feels he is going to pass out. Pt states he had a spinal fusion procedure done last Tuesday.

## 2021-04-10 NOTE — ED Provider Notes (Signed)
[place]    CSN: [id number] Arrival date & time: 04/10/21  1643      History   Chief Complaint Chief Complaint  Patient presents with   Urinary Retention    HPI Paul Morrison is a 65 y.o. male presenting with urinary retention for 2 days following spinal surgery.  Medical history degenerative spondylolisthesis, arthritis.  Here today with wife. 0000000 uncomplicated [id number] decompression and fusion by neurosurgery.  Was able to void a little bit 1 day ago, not at all today.  Some suprapubic pressure, states that he is on some new pain medications he can barely feel any pain.  Does note hemorrhoid exacerbation related to constipation.  Denies pain shooting down legs, denies numbness in arms/legs, denies weakness in arms/legs, denies saddle anesthesia, denies bowel/bladder incontinence. Denies CP, SOB.   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   ACTINIC KERATOSIS 07/18/2010   ACUTE MAXILLARY SINUSITIS 06/23/2009   RHINITIS 03/25/2009   FOOT PAIN 01/25/2009   Essential hypertension 03/21/2007   LOW BACK PAIN 03/21/2007   Hyperlipidemia 03/05/2007    Past Surgical History:  Procedure Laterality Date   COLONOSCOPY     HERNIA REPAIR     INJECTION KNEE     both knees   keratatomy     knee arthroscopy     LUMBAR FUSION     MOHS SURGERY     POLYPECTOMY     TOTAL KNEE ARTHROPLASTY Right 11/24/2016   Procedure: RIGHT TOTAL KNEE ARTHROPLASTY;  Surgeon: [name], MD;   Location: [facility];  Service: Orthopedics;  Laterality: Right;   VASECTOMY         Home Medications    Prior to Admission medications   Medication Sig Start Date End Date Taking? Authorizing Provider  allopurinol (ZYLOPRIM) 300 MG tablet TAKE ONE TABLET DAILY 01/21/21   Burchette, Alinda Sierras, MD  ALPRAZolam Duanne Moron) 0.5 MG tablet Take 1 tablet (0.5 mg total) by mouth 3 (three) times daily as needed for anxiety. Patient not taking: Reported on 03/14/2021 11/15/18   Eulas Post, MD  amoxicillin (AMOXIL) 500 MG capsule Take 4 tablets one hour prior to dental procedure. 01/19/20   Burchette, Alinda Sierras, MD  atorvastatin (LIPITOR) 40 MG tablet TAKE ONE TABLET DAILY 02/14/21   Burchette, Alinda Sierras, MD  colchicine 0.6 MG tablet TAKE ONE TABLET TWICE DAILY AS NEEDED FOR GOUT FLARE 01/21/21   Burchette, Alinda Sierras, MD  diazepam (VALIUM) 5 MG tablet Take 1-2 tablets (5-10 mg total) by mouth every 6 (six) hours as needed for muscle spasms. 04/06/21   Earnie Larsson, MD  gabapentin (NEURONTIN) 300 MG capsule Take 600 mg by mouth 3 (three) times daily. 03/12/21   [provider]  HYDROcodone-acetaminophen (NORCO/VICODIN) 5-325 MG tablet Take 1-2 tablets by mouth every 4 (four) hours as needed for moderate pain. One po q 6 hours prn pain.  May refill on or after 06-25-17 Patient not taking: Reported on  03/14/2021 12/15/20   Burchette, Alinda Sierras, MD  HYDROmorphone (DILAUDID) 2 MG tablet Take 2 mg by mouth every 8 (eight) hours as needed for pain. 02/22/21   [provider]  indomethacin (INDOCIN) 50 MG capsule TAKE ONE (1) CAPSULE THREE (3) TIMES EACH DAY AS NEEDED 03/02/21   Burchette, Alinda Sierras, MD  losartan (COZAAR) 100 MG tablet TAKE ONE TABLET DAILY 03/28/21   Burchette, Alinda Sierras, MD  mupirocin ointment (BACTROBAN) 2 % Apply intranasal bid for 5 days. Patient taking differently: Apply 1 application topically daily as needed (wound care). 11/28/17   Burchette, Alinda Sierras, MD  oxyCODONE 10 MG TABS Take 1 tablet (10 mg  total) by mouth every 3 (three) hours as needed for severe pain ((score 7 to 10)). 04/06/21   Earnie Larsson, MD  sildenafil (REVATIO) 20 MG tablet TAKE 2 TO 5 TABLETS ONE HOUR PRIOR TO SEXUAL ACTIVITY 01/18/21   Burchette, Alinda Sierras, MD  Testosterone 20.25 MG/ACT (1.62%) GEL Apply 2 pumps sprays per arm once daily early each morning. 03/29/21   Burchette, Alinda Sierras, MD    Family History Family History  Problem Relation Age of Onset   Hyperlipidemia Mother    Hypertension Mother    Asthma Mother    Coronary artery disease Other    Aneurysm Other    Colon cancer Neg Hx    Colon polyps Neg Hx    Esophageal cancer Neg Hx    Rectal cancer Neg Hx    Stomach cancer Neg Hx     Social History Social History   Tobacco Use   Smoking status: Former   Smokeless tobacco: Never  Scientific laboratory technician Use: Never used  Substance Use Topics   Alcohol use: Yes    Comment: Occas   Drug use: No     Allergies   Cephalexin and Celecoxib   Review of Systems Review of Systems  Constitutional:  Negative for appetite change, chills, diaphoresis and fever.  Respiratory:  Negative for shortness of breath.   Cardiovascular:  Negative for chest pain.  Gastrointestinal:  Negative for abdominal pain, blood in stool, constipation, diarrhea, nausea and vomiting.  Genitourinary:  Positive for difficulty urinating. Negative for decreased urine volume, dysuria, flank pain, frequency, genital sores, hematuria, penile discharge, penile pain, penile swelling, scrotal swelling, testicular pain and urgency.  Musculoskeletal:  Negative for back pain.  Neurological:  Negative for dizziness, weakness and light-headedness.  All other systems reviewed and are negative.   Physical Exam Triage Vital Signs ED Triage Vitals  Enc Vitals Group     BP      Pulse      Resp      Temp      Temp src      SpO2      Weight      Height      Head Circumference      Peak Flow      Pain Score      Pain Loc      Pain Edu?       Excl. in [place]?    No data found.  Updated Vital Signs BP 127/66 (BP Location: Right Arm)   Pulse 95   Temp 98.3 F (36.8 C) (Oral)   Resp 19   SpO2 95%   Visual Acuity Right Eye Distance:   Left Eye Distance:   Bilateral Distance:    Right Eye Near:   Left Eye Near:    Bilateral Near:  Physical Exam Vitals reviewed.  Constitutional:      General: He is not in acute distress.    Appearance: Normal appearance. He is not ill-appearing.  HENT:     Head: Normocephalic and atraumatic.     Mouth/Throat:     Mouth: Mucous membranes are moist.     Comments: Moist mucous membranes Eyes:     Extraocular Movements: Extraocular movements intact.     Pupils: Pupils are equal, round, and reactive to light.  Cardiovascular:     Rate and Rhythm: Normal rate and regular rhythm.     Heart sounds: Normal heart sounds.  Pulmonary:     Effort: Pulmonary effort is normal.     Breath sounds: Normal breath sounds. No wheezing, rhonchi or rales.  Abdominal:     General: Abdomen is protuberant. Bowel sounds are normal. There is distension.     Palpations: Abdomen is soft. There is no mass.     Tenderness: There is no abdominal tenderness. There is no right CVA tenderness, left CVA tenderness, guarding or rebound.  Skin:    General: Skin is warm.     Capillary Refill: Capillary refill takes less than 2 seconds.     Comments: Good skin turgor  Neurological:     General: No focal deficit present.     Mental Status: He is alert and oriented to person, place, and time.     Comments: PERRLA, EOMI, CN 2-12 grossly intact. Negative rhomberg, pronator drift, fingers to thumb. Strength and sensation grossly intact upper and lower extremties, no saddle anesthesia. Ambulating using wheelchair.  Psychiatric:        Mood and Affect: Mood normal.        Behavior: Behavior normal.     UC Treatments / Results  Labs (all labs ordered are listed, but only abnormal results are displayed) Labs  Reviewed - No data to display  EKG   Radiology No results found.  Procedures Procedures (including critical care time)  Medications Ordered in UC Medications - No data to display  Initial Impression / Assessment and Plan / UC Course  I have reviewed the triage vital signs and the nursing notes.  Pertinent labs & imaging results that were available during my care of the patient were reviewed by me and considered in my medical decision making (see chart for details).     This patient is a very pleasant 65 y.o. year old male presenting with urinary retention. Afebrile, nontachycardic. 0000000 uncomplicated [id number] decompression and fusion by neurosurgery, today with urinary retention and lightheadedness x2 days getting worse. Strength and sensation intact upper and lower extremities, low suspicion for cauda equina. Sent to ED for further management via personal vehicle driven by wife.    Final Clinical Impressions(s) / UC Diagnoses   Final diagnoses:  Postoperative urinary retention     Discharge Instructions      -Head straight to the emergency department for further evaluation and management of your urinary retention, suspect this was related to the anesthesia or pain medications, but you will need a catheter and possibly other work-up that we cannot perform at urgent care.  Please head straight there, if symptoms get worse on the way stop and call 911 immediately.     ED Prescriptions   None    PDMP not reviewed this encounter.   Hazel Sams, PA-C 04/10/21 1718

## 2021-04-10 NOTE — ED Notes (Signed)
Wife at pt bedside, expressing concern to this RN about pt's "constipation and hemorrhoids." EDP made aware of family concerns.

## 2021-04-10 NOTE — Discharge Instructions (Addendum)
You were seen in the emergency room today with urinary retention.  We have placed a Foley catheter that will need to be removed by the urologist.  Please call the urology doctors tomorrow to schedule follow-up appointment.  Continue to take the Flomax prescribed by your neurosurgery team.  I have also called in constipation and hemorrhoid medications.  Please take these as prescribed and return to the emergency department any new or suddenly worsening symptoms.

## 2021-04-10 NOTE — Discharge Instructions (Addendum)
-  Head straight to the emergency department for further evaluation and management of your urinary retention, suspect this was related to the anesthesia or pain medications, but you will need a catheter and possibly other work-up that we cannot perform at urgent care.  Please head straight there, if symptoms get worse on the way stop and call 911 immediately.

## 2021-04-10 NOTE — ED Notes (Signed)
Pt given ice water per Dr Laverta Baltimore ok

## 2021-04-10 NOTE — ED Provider Notes (Signed)
Emergency Department Provider Note   I have reviewed the triage vital signs and the nursing notes.   HISTORY  Chief Complaint Urinary Retention   HPI Paul Morrison is a 65 y.o. male with PMH reviewed below including L3/4 decompression/fusion with Dr. Annette Stable on 9/6 presents to the ED with urinary retention.  Patient states he has had only dribbling urination at times over the past several days.  He is not had a full urination in the past 3 days.  Is having increasing lower abdominal discomfort.  Has been pushing and straining to try and urinate and feels that he may have inflamed his hemorrhoids as well.  He has postsurgical back pain but not particularly worse than normal.  He is not experiencing any new numbness or weakness in the leg.  No fevers or chills.  No radiation of symptoms or other modifying factors.   Past Medical History:  Diagnosis Date   Arthritis    Cancer ([place])    skin cancer left ear- MOHS   Hyperlipidemia    Hypertension    Low back pain     Patient Active Problem List   Diagnosis Date Noted   Degenerative spondylolisthesis 04/05/2021   Colon adenomas 06/11/2019   Gout 03/27/2018   Osteoarthritis of right knee 11/24/2016   S/P knee replacement 11/24/2016   Hypogonadism male 09/16/2014   Erectile dysfunction 123XX123   DYSMETABOLIC SYNDROME X [id number]   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   ACTINIC KERATOSIS 07/18/2010   ACUTE MAXILLARY SINUSITIS 06/23/2009   RHINITIS 03/25/2009   FOOT PAIN 01/25/2009   Essential hypertension 03/21/2007   LOW BACK PAIN 03/21/2007   Hyperlipidemia 03/05/2007    Past Surgical History:  Procedure Laterality Date   COLONOSCOPY     HERNIA REPAIR     INJECTION KNEE     both knees   keratatomy     knee arthroscopy     LUMBAR FUSION     MOHS SURGERY     POLYPECTOMY     TOTAL KNEE ARTHROPLASTY Right 11/24/2016   Procedure: RIGHT TOTAL KNEE ARTHROPLASTY;  Surgeon: [name], MD;  Location: [facility];  Service: Orthopedics;  Laterality: Right;   VASECTOMY      Allergies Cephalexin and Celecoxib  Family History  Problem Relation Age of Onset   Hyperlipidemia Mother    Hypertension Mother    Asthma Mother    Coronary artery disease Other    Aneurysm Other    Colon cancer Neg Hx    Colon polyps Neg Hx    Esophageal cancer Neg Hx    Rectal cancer Neg Hx    Stomach cancer Neg Hx     Social History Social History   Tobacco Use   Smoking status: Former   Smokeless tobacco: Never  Scientific laboratory technician Use: Never used  Substance Use Topics   Alcohol use: Yes    Comment: Occas   Drug use: No    Review of Systems  Constitutional: No fever/chills Eyes: No visual changes. ENT: No sore throat. Cardiovascular: Denies chest pain. Respiratory: Denies shortness of breath. Gastrointestinal: Positive lower abdominal pain.  No nausea, no vomiting.  No diarrhea.  No constipation. Positive rectal pain and bleeding.  Genitourinary: Dribbling urination and retention symptoms.   Musculoskeletal: Negative for back pain. Skin: Negative for rash. Neurological: Negative for headaches, focal weakness or numbness.  10-point ROS otherwise negative.  ____________________________________________   PHYSICAL EXAM:  VITAL SIGNS: ED Triage Vitals  Enc Vitals Group     BP 04/10/21 1736 (!) 142/82     Pulse Rate 04/10/21 1736 100     Resp 04/10/21 1736 14     Temp 04/10/21 1737 98.2 F (36.8 C)     Temp Source 04/10/21 1737 Oral     SpO2 04/10/21 1736 98 %   Constitutional: Alert and oriented. Appears uncomfortable. Able to assist with stand from wheelchair to the bed.  Eyes: Conjunctivae are normal.  Head: Atraumatic. Nose: No congestion/rhinnorhea. Mouth/Throat: Mucous membranes are moist.  Neck: No stridor.   Cardiovascular: Normal rate, regular rhythm. Good peripheral circulation. Grossly normal heart sounds.   Respiratory: Normal respiratory effort.  No retractions. Lungs  CTAB. Gastrointestinal: Soft with lower abdominal tenderness. No rebound or guarding. No distention. Non-thrombosed external hemorrhoids without active bleeding. Exam performed with nurse chaperone.  Musculoskeletal: No lower extremity tenderness nor edema. No gross deformities of extremities. Neurologic:  Normal speech and language. No gross focal neurologic deficits are appreciated. Normal strength/sensation in the bilateral LEs. Skin:  Skin is warm, dry and intact. No rash noted.   ____________________________________________   LABS (all labs ordered are listed, but only abnormal results are displayed)  Labs Reviewed  COMPREHENSIVE METABOLIC PANEL - Abnormal; Notable for the following components:      Result Value   Sodium 133 (*)    Chloride 95 (*)    Glucose, Bld 117 (*)    BUN 7 (*)    Total Protein 6.2 (*)    Albumin 2.9 (*)    ALT 58 (*)    All other components within normal limits  URINALYSIS, ROUTINE W REFLEX MICROSCOPIC - Abnormal; Notable for the following components:   Specific Gravity, Urine <1.005 (*)    Hgb urine dipstick SMALL (*)    All other components within normal limits  CBC WITH DIFFERENTIAL/PLATELET  URINALYSIS, MICROSCOPIC (REFLEX)   ____________________________________________  RADIOLOGY  None   ____________________________________________   PROCEDURES  Procedure(s) performed:   Procedures  None ____________________________________________   INITIAL IMPRESSION / ASSESSMENT AND PLAN / ED COURSE  Pertinent labs & imaging results that were available during my care of the patient were reviewed by me and considered in my medical decision making (see chart for details).   Patient presents emergency department for evaluation of urinary retention symptoms which been building over the past 3 days.  Clinically looks uncomfortable.  Concern for acute urinary retention and do plan for Foley placement here.  Will obtain screening blood work.  Low  suspicion overall for spine complication, bleeding, infection.  Patient also having rectal pain with hemorrhoids. Will place foley and reassess.   Foley placed with large volume urine return (~772m). Patient feeling much better. Discussed case with NSG PA on for Dr. PAnnette Stable No neuro deficits and back pain improving overall. No indication for repeat spine imaging. Patient with large external, non-thrombosed hemorrhoids. Plan for Anusol and constipation mgmt at home. Placed referral for Urology follow up for void trial. Will d/c home with foley. Instructions given by nursing staff on home mgmt of foley. Discussed ED return precautions.  ____________________________________________  FINAL CLINICAL IMPRESSION(S) / ED DIAGNOSES  Final diagnoses:  Urinary retention  Constipation, unspecified constipation type  External hemorrhoids     MEDICATIONS GIVEN DURING THIS VISIT:  Medications  morphine 4 MG/ML injection 4 mg (4 mg Intravenous Given 04/10/21 1817)     NEW OUTPATIENT MEDICATIONS STARTED DURING THIS VISIT:  Discharge Medication List as of 04/10/2021  8:21 PM  START taking these medications   Details  hydrocortisone (ANUSOL-HC) 25 MG suppository Place 1 suppository (25 mg total) rectally 2 (two) times daily., Starting Sun 04/10/2021, Normal    linaclotide (LINZESS) 72 MCG capsule Take 1 capsule (72 mcg total) by mouth daily before breakfast., Starting Sun 04/10/2021, Normal    senna-docusate (SENOKOT-S) 8.6-50 MG tablet Take 1 tablet by mouth at bedtime as needed for mild constipation or moderate constipation., Starting Sun 04/10/2021, Normal        Note:  This document was prepared using Dragon voice recognition software and may include unintentional dictation errors.  Nanda Quinton, MD, [facility] Emergency Medicine    [employer] Burkitt, [facility], MD 04/13/21 [phone]

## 2021-04-10 NOTE — ED Triage Notes (Signed)
Pt c/o urinary retention x2 days, recent spinal fusion (L3-4) sx 9/5, discharged 9/6, states progressive worsening of retention since  10/10 abd pain

## 2021-04-10 NOTE — ED Notes (Signed)
Bladder scan completed with 574m retention, patient tolerated procedure well.

## 2021-04-10 NOTE — ED Notes (Signed)
RN assisted pt into disposable paper pants and emphasized the importance of keeping foley drainage bag below waist/bladder level. Pt assisted into triage chair for discharge, as wheelchairs were not compatible with pt's inability to bend at waist per recent spinal fusion. Assisted into his car.

## 2021-04-11 ENCOUNTER — Encounter: Payer: Self-pay | Admitting: Family Medicine

## 2021-04-12 ENCOUNTER — Other Ambulatory Visit: Payer: Self-pay

## 2021-04-12 ENCOUNTER — Encounter (HOSPITAL_COMMUNITY): Payer: Self-pay

## 2021-04-12 ENCOUNTER — Emergency Department (HOSPITAL_COMMUNITY)
Admission: EM | Admit: 2021-04-12 | Discharge: 2021-04-12 | Disposition: A | Discharge status: Home / Self Care | Payer: BC Managed Care – PPO | Attending: Emergency Medicine | Admitting: Emergency Medicine

## 2021-04-12 DIAGNOSIS — R301 Vesical tenesmus: Secondary | ICD-10-CM | POA: Diagnosis not present

## 2021-04-12 DIAGNOSIS — Z85828 Personal history of other malignant neoplasm of skin: Secondary | ICD-10-CM | POA: Diagnosis not present

## 2021-04-12 DIAGNOSIS — R1030 Lower abdominal pain, unspecified: Secondary | ICD-10-CM | POA: Diagnosis present

## 2021-04-12 DIAGNOSIS — Z79899 Other long term (current) drug therapy: Secondary | ICD-10-CM | POA: Diagnosis not present

## 2021-04-12 DIAGNOSIS — R3989 Other symptoms and signs involving the genitourinary system: Secondary | ICD-10-CM

## 2021-04-12 DIAGNOSIS — Z96651 Presence of right artificial knee joint: Secondary | ICD-10-CM | POA: Diagnosis not present

## 2021-04-12 DIAGNOSIS — K644 Residual hemorrhoidal skin tags: Secondary | ICD-10-CM | POA: Diagnosis not present

## 2021-04-12 DIAGNOSIS — R300 Dysuria: Secondary | ICD-10-CM | POA: Insufficient documentation

## 2021-04-12 DIAGNOSIS — N3289 Other specified disorders of bladder: Secondary | ICD-10-CM

## 2021-04-12 DIAGNOSIS — Z7689 Persons encountering health services in other specified circumstances: Secondary | ICD-10-CM

## 2021-04-12 DIAGNOSIS — I10 Essential (primary) hypertension: Secondary | ICD-10-CM | POA: Insufficient documentation

## 2021-04-12 DIAGNOSIS — Z87891 Personal history of nicotine dependence: Secondary | ICD-10-CM | POA: Insufficient documentation

## 2021-04-12 LAB — CBC WITH DIFFERENTIAL/PLATELET
Abs Immature Granulocytes: 0.02 10*3/uL (ref 0.00–0.07)
Basophils Absolute: 0 10*3/uL (ref 0.0–0.1)
Basophils Relative: 0 %
Eosinophils Absolute: 0.2 10*3/uL (ref 0.0–0.5)
Eosinophils Relative: 2 %
HCT: 44.1 % (ref 39.0–52.0)
Hemoglobin: 15 g/dL (ref 13.0–17.0)
Immature Granulocytes: 0 %
Lymphocytes Relative: 15 %
Lymphs Abs: 1.4 10*3/uL (ref 0.7–4.0)
MCH: 31.8 pg (ref 26.0–34.0)
MCHC: 34 g/dL (ref 30.0–36.0)
MCV: 93.4 fL (ref 80.0–100.0)
Monocytes Absolute: 1 10*3/uL (ref 0.1–1.0)
Monocytes Relative: 11 %
Neutro Abs: 6.7 10*3/uL (ref 1.7–7.7)
Neutrophils Relative %: 72 %
Platelets: 249 10*3/uL (ref 150–400)
RBC: 4.72 MIL/uL (ref 4.22–5.81)
RDW: 12.8 % (ref 11.5–15.5)
WBC: 9.4 10*3/uL (ref 4.0–10.5)
nRBC: 0 % (ref 0.0–0.2)

## 2021-04-12 LAB — URINALYSIS, ROUTINE W REFLEX MICROSCOPIC
Bilirubin Urine: NEGATIVE
Glucose, UA: NEGATIVE mg/dL
Ketones, ur: NEGATIVE mg/dL
Nitrite: NEGATIVE
Protein, ur: 100 mg/dL — AB
Specific Gravity, Urine: 1.013 (ref 1.005–1.030)
pH: 5 (ref 5.0–8.0)

## 2021-04-12 LAB — BASIC METABOLIC PANEL
Anion gap: 11 (ref 5–15)
BUN: 8 mg/dL (ref 8–23)
CO2: 27 mmol/L (ref 22–32)
Calcium: 9.6 mg/dL (ref 8.9–10.3)
Chloride: 96 mmol/L — ABNORMAL LOW (ref 98–111)
Creatinine, Ser: 0.89 mg/dL (ref 0.61–1.24)
GFR, Estimated: >60 mL/min (ref >60)
Glucose, Bld: 123 mg/dL — ABNORMAL HIGH (ref 70–99)
Potassium: 4.1 mmol/L (ref 3.5–5.1)
Sodium: 134 mmol/L — ABNORMAL LOW (ref 135–145)

## 2021-04-12 MED ORDER — OXYCODONE HCL 5 MG PO TABS
10.0000 mg | ORAL_TABLET | Freq: Once | ORAL | Status: AC
  Administered 2021-04-12: 10 mg via ORAL
  Filled 2021-04-12: qty 2

## 2021-04-12 MED ORDER — HYDROCORTISONE (PERIANAL) 2.5 % EX CREA: 1.0000 "application " | TOPICAL_CREAM | Freq: Two times a day (BID) | CUTANEOUS | 0 refills | Status: DC

## 2021-04-12 MED ORDER — OXYBUTYNIN CHLORIDE 5 MG PO TABS
5.0000 mg | ORAL_TABLET | Freq: Once | ORAL | Status: AC
  Administered 2021-04-12: 5 mg via ORAL
  Filled 2021-04-12 (×2): qty 1

## 2021-04-12 MED ORDER — OXYBUTYNIN CHLORIDE 5 MG PO TABS: 5.0000 mg | ORAL_TABLET | Freq: Three times a day (TID) | ORAL | 0 refills | Status: DC

## 2021-04-12 MED ORDER — TAMSULOSIN HCL 0.4 MG PO CAPS
0.4000 mg | ORAL_CAPSULE | Freq: Once | ORAL | Status: AC
  Administered 2021-04-12: 0.4 mg via ORAL
  Filled 2021-04-12: qty 1

## 2021-04-12 MED ORDER — OXYCODONE HCL 10 MG PO TABS: 10.0000 mg | ORAL_TABLET | ORAL | 0 refills | Status: DC | PRN

## 2021-04-12 MED ORDER — HYDROMORPHONE HCL 1 MG/ML IJ SOLN
1.0000 mg | Freq: Once | INTRAMUSCULAR | Status: AC
  Administered 2021-04-12: 1 mg via INTRAVENOUS
  Filled 2021-04-12: qty 1

## 2021-04-12 NOTE — ED Notes (Signed)
Expressed to pt the need of a urine sample. Pt given urine sample cup.  

## 2021-04-12 NOTE — Discharge Instructions (Addendum)
Please use the oxybutynin for bladder spasm up to 3 times a day.  Anusol rectal cream for hemorrhoids.  Continue your other medications.  Drink plenty of fluids.  Keep your urology appointment and stop the oxybutynin at least 2 days before the appointment.  Call urology if your symptoms are not adequately controlled.

## 2021-04-12 NOTE — Telephone Encounter (Signed)
Please advise. [place] for urology referral?

## 2021-04-12 NOTE — ED Notes (Signed)
Foley bag replaced and labeled.

## 2021-04-12 NOTE — ED Provider Notes (Signed)
[facility] EMERGENCY DEPARTMENT Provider Note   CSN: [id number] Arrival date & time: 04/12/21  [id number]     History Chief Complaint  Patient presents with   Groin Pain    Pt had a back surgery a week ago and a foley was placed, now complaining of pain in the bladder and penis    Paul Morrison is a 65 y.o. male.  He had back surgery by Dr. Trenton Gammon about a week ago.  He was seen in the ER a few days after for dribbling urine and found to be in retention.  He had a Foley catheter placed at that time.  Since then he has had extreme pain through his penis and lower abdomen.  Catheter seems to be functioning well and filling.  He is eating his pain medication without any improvement.  He also had a hemorrhoid occur while he was pushing trying to urinate.  He is unable to see urology until the 28th.  The history is provided by the patient.  Abdominal Pain Pain location:  Suprapubic Pain quality: aching   Pain severity:  Severe Onset quality:  Gradual Duration:  3 days Timing:  Constant Progression:  Unchanged Chronicity:  New Relieved by:  Nothing Worsened by:  Nothing Ineffective treatments: narcotics. Associated symptoms: dysuria   Associated symptoms: no chest pain, no fever, no shortness of breath, no sore throat and no vomiting       Past Medical History:  Diagnosis Date   Arthritis    Cancer ([place])    skin cancer left ear- MOHS   Hyperlipidemia    Hypertension    Low back pain     Patient Active Problem List   Diagnosis Date Noted   Degenerative spondylolisthesis 04/05/2021   Colon adenomas 06/11/2019   Gout 03/27/2018   Osteoarthritis of right knee 11/24/2016   S/P knee replacement 11/24/2016   Hypogonadism male 09/16/2014   Erectile dysfunction [id number]   DYSMETABOLIC SYNDROME X [id number]   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   ACTINIC KERATOSIS 07/18/2010   ACUTE MAXILLARY SINUSITIS 06/23/2009   RHINITIS 03/25/2009   FOOT PAIN  01/25/2009   Essential hypertension 03/21/2007   LOW BACK PAIN 03/21/2007   Hyperlipidemia 03/05/2007    Past Surgical History:  Procedure Laterality Date   COLONOSCOPY     HERNIA REPAIR     INJECTION KNEE     both knees   keratatomy     knee arthroscopy     LUMBAR FUSION     MOHS SURGERY     POLYPECTOMY     TOTAL KNEE ARTHROPLASTY Right 11/24/2016   Procedure: RIGHT TOTAL KNEE ARTHROPLASTY;  Surgeon: [name], MD;  Location: [facility];  Service: Orthopedics;  Laterality: Right;   VASECTOMY         Family History  Problem Relation Age of Onset   Hyperlipidemia Mother    Hypertension Mother    Asthma Mother    Coronary artery disease Other    Aneurysm Other    Colon cancer Neg Hx    Colon polyps Neg Hx    Esophageal cancer Neg Hx    Rectal cancer Neg Hx    Stomach cancer Neg Hx     Social History   Tobacco Use   Smoking status: Former   Smokeless tobacco: Never  Vaping Use   Vaping Use: Never used  Substance Use Topics   Alcohol use: Yes    Comment: Occas   Drug use: No  Home Medications Prior to Admission medications   Medication Sig Start Date End Date Taking? Authorizing Provider  allopurinol (ZYLOPRIM) 300 MG tablet TAKE ONE TABLET DAILY 01/21/21  Yes Burchette, Alinda Sierras, MD  amoxicillin (AMOXIL) 500 MG capsule Take 4 tablets one hour prior to dental procedure. 01/19/20  Yes Burchette, Alinda Sierras, MD  atorvastatin (LIPITOR) 40 MG tablet TAKE ONE TABLET DAILY 02/14/21  Yes Burchette, Alinda Sierras, MD  colchicine 0.6 MG tablet TAKE ONE TABLET TWICE DAILY AS NEEDED FOR GOUT FLARE 01/21/21  Yes Burchette, Alinda Sierras, MD  diazepam (VALIUM) 5 MG tablet Take 1-2 tablets (5-10 mg total) by mouth every 6 (six) hours as needed for muscle spasms. 04/06/21  Yes Pool, Mallie Mussel, MD  gabapentin (NEURONTIN) 300 MG capsule Take 600 mg by mouth 3 (three) times daily. 03/12/21  Yes [provider]  hydrocortisone (ANUSOL-HC) 25 MG suppository Place 1 suppository (25 mg total)  rectally 2 (two) times daily. 04/10/21  Yes Long, Wonda Olds, MD  HYDROmorphone (DILAUDID) 2 MG tablet Take 2 mg by mouth every 8 (eight) hours as needed for pain. 02/22/21  Yes [provider]  indomethacin (INDOCIN) 50 MG capsule TAKE ONE (1) CAPSULE THREE (3) TIMES EACH DAY AS NEEDED Patient taking differently: TAKE ONE (1) CAPSULE THREE (3) TIMES EACH DAY AS NEEDED FOR INFLAMMATION 03/02/21  Yes Burchette, Alinda Sierras, MD  oxyCODONE 10 MG TABS Take 1 tablet (10 mg total) by mouth every 3 (three) hours as needed for severe pain ((score 7 to 10)). 04/06/21  Yes Pool, Mallie Mussel, MD  tamsulosin (FLOMAX) 0.4 MG CAPS capsule Take 0.4 mg by mouth every evening. 04/09/21  Yes [provider]  ALPRAZolam Duanne Moron) 0.5 MG tablet Take 1 tablet (0.5 mg total) by mouth 3 (three) times daily as needed for anxiety. Patient not taking: No sig reported 11/15/18   Burchette, Alinda Sierras, MD  HYDROcodone-acetaminophen (NORCO/VICODIN) 5-325 MG tablet Take 1-2 tablets by mouth every 4 (four) hours as needed for moderate pain. One po q 6 hours prn pain.  May refill on or after 06-25-17 Patient not taking: No sig reported 12/15/20   Eulas Post, MD  linaclotide [facility]) 72 MCG capsule Take 1 capsule (72 mcg total) by mouth daily before breakfast. Patient not taking: Reported on 04/12/2021 04/10/21   Margette Fast, MD  losartan (COZAAR) 100 MG tablet TAKE ONE TABLET DAILY 03/28/21   Burchette, Alinda Sierras, MD  mupirocin ointment (BACTROBAN) 2 % Apply intranasal bid for 5 days. Patient taking differently: Apply 1 application topically daily as needed (wound care). 11/28/17   Burchette, Alinda Sierras, MD  senna-docusate (SENOKOT-S) 8.6-50 MG tablet Take 1 tablet by mouth at bedtime as needed for mild constipation or moderate constipation. 04/10/21   Long, Wonda Olds, MD  sildenafil (REVATIO) 20 MG tablet TAKE 2 TO 5 TABLETS ONE HOUR PRIOR TO SEXUAL ACTIVITY 01/18/21   Eulas Post, MD  Testosterone 20.25 MG/ACT (1.62%) GEL Apply 2  pumps sprays per arm once daily early each morning. 03/29/21   Burchette, Alinda Sierras, MD    Allergies    Cephalexin and Celecoxib  Review of Systems   Review of Systems  Constitutional:  Negative for fever.  HENT:  Negative for sore throat.   Eyes:  Negative for visual disturbance.  Respiratory:  Negative for shortness of breath.   Cardiovascular:  Negative for chest pain.  Gastrointestinal:  Positive for abdominal pain. Negative for vomiting.  Genitourinary:  Positive for dysuria.  Musculoskeletal:  Positive  for back pain.  Skin:  Negative for rash.  Neurological:  Negative for headaches.   Physical Exam Updated Vital Signs BP (!) 146/89 (BP Location: Left Arm)   Pulse 83   Temp 98.2 F (36.8 C) (Oral)   Resp 18   Ht '6\' 3"'$  (1.905 m)   Wt 113.4 kg   SpO2 100%   BMI 31.25 kg/m   Physical Exam Vitals and nursing note reviewed.  Constitutional:      Appearance: Normal appearance. He is well-developed.  HENT:     Head: Normocephalic and atraumatic.  Eyes:     Conjunctiva/sclera: Conjunctivae normal.  Cardiovascular:     Rate and Rhythm: Normal rate and regular rhythm.     Heart sounds: No murmur heard. Pulmonary:     Effort: Pulmonary effort is normal. No respiratory distress.     Breath sounds: Normal breath sounds.  Abdominal:     Palpations: Abdomen is soft.     Tenderness: There is no abdominal tenderness. There is no guarding or rebound.  Musculoskeletal:        General: No deformity or signs of injury. Normal range of motion.     Cervical back: Neck supple.  Skin:    General: Skin is warm and dry.  Neurological:     General: No focal deficit present.     Mental Status: He is alert.    ED Results / Procedures / Treatments   Labs (all labs ordered are listed, but only abnormal results are displayed) Labs Reviewed  BASIC METABOLIC PANEL - Abnormal; Notable for the following components:      Result Value   Sodium 134 (*)    Chloride 96 (*)    Glucose, Bld  123 (*)    All other components within normal limits  URINALYSIS, ROUTINE W REFLEX MICROSCOPIC - Abnormal; Notable for the following components:   Hgb urine dipstick MODERATE (*)    Protein, ur 100 (*)    Leukocytes,Ua SMALL (*)    Bacteria, UA RARE (*)    All other components within normal limits  CBC WITH DIFFERENTIAL/PLATELET    EKG None  Radiology No results found.  Procedures Procedures   Medications Ordered in ED Medications  HYDROmorphone (DILAUDID) injection 1 mg (has no administration in time range)  oxybutynin (DITROPAN) tablet 5 mg (has no administration in time range)    ED Course  I have reviewed the triage vital signs and the nursing notes.  Pertinent labs & imaging results that were available during my care of the patient were reviewed by me and considered in my medical decision making (see chart for details).  Clinical Course as of 04/12/21 1801  Tue Apr 12, 2021  1436 Discussed with Dr. Gloriann Loan from urology.  He recommended adding Flomax Ditropan and check a bladder scan.  Stop the Ditropan 2 days before he has the Foley catheter voiding trial. [MB]  [id number] Patient has some tender hemorrhoids although they do not appear thrombosed. [MB]    Clinical Course User Index [MB] Hayden Rasmussen, MD   MDM Rules/Calculators/A&P                          This patient complains of urethral pain, lower abdominal pain, rectal pain; this involves an extensive number of treatment Options and is a complaint that carries with it a high risk of complications and Morbidity. The differential includes malpositioned Foley, bladder spasm, urethritis, hemorrhoids, thrombosed hemorrhoids, constipation, urinary  retention  I ordered, reviewed and interpreted labs, which included CBC with normal white count normal hemoglobin, chemistries fairly unremarkable, urinalysis without clear signs of infection I ordered medication IV pain medicine, oral oxybutynin and Flomax I ordered imaging  studies which included bladder scan and I independently    visualized and interpreted imaging which showed no significant bladder volume Additional history obtained from patient's wife Previous records obtained and reviewed in epic including prior ED note I consulted Dr. Gloriann Loan urology and discussed lab and imaging findings  Critical Interventions: None  After the interventions stated above, I reevaluated the patient and found patient's pain to be somewhat improved.  His care is signed out to oncoming provider Dr. Pearline Cables to follow-up on patient's response to medication.  Likely can be discharged to follow-up outpatient with urology.   Final Clinical Impression(s) / ED Diagnoses Final diagnoses:  Bladder spasm  Urethral pain  External hemorrhoids    Rx / DC Orders ED Discharge Orders          Ordered    oxybutynin (DITROPAN) 5 MG tablet  3 times daily        04/12/21 1550    hydrocortisone (ANUSOL-HC) 2.5 % rectal cream  2 times daily        04/12/21 1550             Hayden Rasmussen, MD 04/12/21 [phone]

## 2021-04-12 NOTE — ED Triage Notes (Signed)
No ems treatment other than vs which were stable, having pain at urinary catheter site, is draining clear yellow urine. Pt is on oxycodone, valium and gabapentin and is getting no relief from pain

## 2021-04-12 NOTE — ED Notes (Signed)
Patient delaying discharge. Says he must have Hydromorphone.

## 2021-04-12 NOTE — ED Provider Notes (Signed)
   4:35 PM Patient signed out to me by previous ED physician.  65 yo male presenting for penile pain from home urinary cath that was placed for urinary retention after back surgery. Recommendations provided by urology. Pt already has prescriptions for recommended meds-Flomax and oxybutynin.  -No retention on bladder scan -No UTI on UA -No signs of swelling or trauma at catheter insertion site.  -Pain meds sent to pharmacy. -Pt given follow up recommendations-urology working on sooner appointment. Will contact patient.   Patient in no distress and overall condition improved here in the ED. Detailed discussions were had with the patient regarding current findings, and need for close f/u with PCP or on call doctor. The patient has been instructed to return immediately if the symptoms worsen in any way for re-evaluation. Patient verbalized understanding and is in agreement with current care plan. All questions answered prior to discharge.    Campbell Stall P, DO [id number]

## 2021-04-12 NOTE — ED Provider Notes (Signed)
Emergency Medicine Provider Triage Evaluation Note  Paul Morrison , a 65 y.o. male  was evaluated in triage.  Pt complains of pain to the tip of his penis which is constant and "excruciating", unrelieved with oxycodone, valium, gabapentin. Pain will intermittently be sharp and radiate towards his bladder. Onset was 1 week ago after L3-4 decompression and fusion when patient began experiencing urinary retention. Pain has persisted despite catheter placement.  Review of Systems  Positive: Penile pain Negative: Hematuria   Physical Exam  BP 126/90 (BP Location: Left Arm)   Pulse 90   Temp 98.9 F (37.2 C) (Oral)   Resp 16   Ht '6\' 3"'$  (1.905 m)   Wt 113.4 kg   SpO2 94%   BMI 31.25 kg/m  Gen:   Awake, no distress   Resp:  Normal effort  MSK:   Moves extremities without difficulty  Other:  Appears uncomfortable  Medical Decision Making  Medically screening exam initiated at 6:37 AM.  Appropriate orders placed.  [name] was informed that the remainder of the evaluation will be completed by another provider, this initial triage assessment does not replace that evaluation, and the importance of remaining in the ED until their evaluation is complete.  Penile pain S/p L3-4 decompression, fusion   Antonietta Breach, PA-C 04/12/21 [id number]    Ripley Fraise, MD 04/12/21 [phone]

## 2021-04-15 ENCOUNTER — Telehealth (INDEPENDENT_AMBULATORY_CARE_PROVIDER_SITE_OTHER): Payer: BC Managed Care – PPO | Admitting: Family Medicine

## 2021-04-15 ENCOUNTER — Telehealth: Payer: Self-pay

## 2021-04-15 ENCOUNTER — Other Ambulatory Visit: Payer: Self-pay

## 2021-04-15 DIAGNOSIS — R338 Other retention of urine: Secondary | ICD-10-CM | POA: Diagnosis not present

## 2021-04-15 DIAGNOSIS — K5903 Drug induced constipation: Secondary | ICD-10-CM | POA: Diagnosis not present

## 2021-04-15 DIAGNOSIS — K644 Residual hemorrhoidal skin tags: Secondary | ICD-10-CM | POA: Diagnosis not present

## 2021-04-15 MED ORDER — HYDROCORT-PRAMOXINE (PERIANAL) 1-1 % EX FOAM: 1.0000 | Freq: Four times a day (QID) | CUTANEOUS | 1 refills | Status: DC

## 2021-04-15 MED ORDER — HYDROCORTISONE (PERIANAL) 2.5 % EX CREA: TOPICAL_CREAM | CUTANEOUS | 0 refills | Status: DC

## 2021-04-15 NOTE — Progress Notes (Signed)
Patient ID: Paul Morrison, male   DOB: [birth year], 65 y.o.   MRN: [id number]   This visit type was conducted due to national recommendations for restrictions regarding the COVID-19 pandemic in an effort to limit this patient's exposure and mitigate transmission in our community.   Virtual Visit via Video Note  I connected with Paul Morrison on 04/15/21 at  2:15 PM EDT by a video enabled telemedicine application and verified that I am speaking with the correct person using two identifiers.  Location patient: home Location provider:work or home office Persons participating in the virtual visit: patient, provider  I discussed the limitations of evaluation and management by telemedicine and the availability of in person appointments. The patient expressed understanding and agreed to proceed.   HPI:  Paul Morrison had recent elective surgery for L3-4 decompression and fusion.  He did well postoperatively and lower extremity pain had improved.  He was discharged home following surgery but developed some urinary retention and went to urgent care on the 11th and subsequently referred over to the ER for further management.  Foley catheter was placed with return of 700 mL of urine.  Patient felt better afterwards.  He then returned to the ER 2 days later with severe pain at the tip of his penis with pain radiating toward the bladder.  The catheter ended up being replaced and he has had some gradual improvement since then.  He is also had significant constipation following surgery and is taking opioids for pain control.  He was started on Senokot-S initially and MiraLAX without much improvement.  More recently started on Relistor for opioid-induced constipation and that seems to be helping his constipation symptoms.  He does have a couple of large nonthrombosed hemorrhoids which are noted in the ED.  He has tried some topical cream but still has some discomfort.  He hopes to start sitz bath soon.  Using 2.5%  hydrocortisone cream currently.  Denies any recent fever.  Had urinary studies which did not show evidence for UTI recently.   ROS: See pertinent positives and negatives per HPI.  Past Medical History:  Diagnosis Date   Arthritis    Cancer ([place])    skin cancer left ear- MOHS   Hyperlipidemia    Hypertension    Low back pain     Past Surgical History:  Procedure Laterality Date   COLONOSCOPY     HERNIA REPAIR     INJECTION KNEE     both knees   keratatomy     knee arthroscopy     LUMBAR FUSION     MOHS SURGERY     POLYPECTOMY     TOTAL KNEE ARTHROPLASTY Right 11/24/2016   Procedure: RIGHT TOTAL KNEE ARTHROPLASTY;  Surgeon: [name], MD;  Location: [facility];  Service: Orthopedics;  Laterality: Right;   VASECTOMY      Family History  Problem Relation Age of Onset   Hyperlipidemia Mother    Hypertension Mother    Asthma Mother    Coronary artery disease Other    Aneurysm Other    Colon cancer Neg Hx    Colon polyps Neg Hx    Esophageal cancer Neg Hx    Rectal cancer Neg Hx    Stomach cancer Neg Hx     SOCIAL HX: Non-smoker   Current Outpatient Medications:    allopurinol (ZYLOPRIM) 300 MG tablet, TAKE ONE TABLET DAILY, Disp: 90 tablet, Rfl: 0   ALPRAZolam (XANAX) 0.5 MG tablet, Take 1 tablet (0.5  mg total) by mouth 3 (three) times daily as needed for anxiety., Disp: 30 tablet, Rfl: 0   amoxicillin (AMOXIL) 500 MG capsule, Take 4 tablets one hour prior to dental procedure., Disp: 4 capsule, Rfl: 0   atorvastatin (LIPITOR) 40 MG tablet, TAKE ONE TABLET DAILY, Disp: 90 tablet, Rfl: 1   bisacodyl (DULCOLAX) 5 MG EC tablet, Take 5 mg by mouth 3 (three) times daily as needed for moderate constipation., Disp: , Rfl:    colchicine 0.6 MG tablet, TAKE ONE TABLET TWICE DAILY AS NEEDED FOR GOUT FLARE, Disp: 60 tablet, Rfl: 3   diazepam (VALIUM) 5 MG tablet, Take 1-2 tablets (5-10 mg total) by mouth every 6 (six) hours as needed for muscle spasms., Disp: 30 tablet, Rfl:  0   gabapentin (NEURONTIN) 300 MG capsule, Take 600 mg by mouth 3 (three) times daily., Disp: , Rfl:    HYDROcodone-acetaminophen (NORCO/VICODIN) 5-325 MG tablet, Take 1-2 tablets by mouth every 4 (four) hours as needed for moderate pain. One po q 6 hours prn pain.  May refill on or after 06-25-17 (Patient taking differently: Take 1-2 tablets by mouth every 4 (four) hours as needed for moderate pain. One po q 6 hours prn pain.  May refill on or after 06-25-17), Disp: 30 tablet, Rfl: 0   hydrocortisone-pramoxine (PROCTOFOAM-HC) rectal foam, Place 1 applicator rectally 4 (four) times daily., Disp: 10 g, Rfl: 1   indomethacin (INDOCIN) 50 MG capsule, TAKE ONE (1) CAPSULE THREE (3) TIMES EACH DAY AS NEEDED (Patient taking differently: TAKE ONE (1) CAPSULE THREE (3) TIMES EACH DAY AS NEEDED FOR INFLAMMATION), Disp: 30 capsule, Rfl: 0   linaclotide (LINZESS) 72 MCG capsule, Take 1 capsule (72 mcg total) by mouth daily before breakfast., Disp: 30 capsule, Rfl: 0   losartan (COZAAR) 100 MG tablet, TAKE ONE TABLET DAILY, Disp: 90 tablet, Rfl: 0   oxybutynin (DITROPAN) 5 MG tablet, Take 1 tablet (5 mg total) by mouth 3 (three) times daily., Disp: 60 tablet, Rfl: 0   oxyCODONE 10 MG TABS, Take 1 tablet (10 mg total) by mouth every 4 (four) hours as needed for up to 12 doses for severe pain., Disp: 12 tablet, Rfl: 0   senna-docusate (SENOKOT-S) 8.6-50 MG tablet, Take 1 tablet by mouth at bedtime as needed for mild constipation or moderate constipation., Disp: 30 tablet, Rfl: 0   sildenafil (REVATIO) 20 MG tablet, TAKE 2 TO 5 TABLETS ONE HOUR PRIOR TO SEXUAL ACTIVITY, Disp: 50 tablet, Rfl: 5   tamsulosin (FLOMAX) 0.4 MG CAPS capsule, Take 0.4 mg by mouth every evening., Disp: , Rfl:    Testosterone 20.25 MG/ACT (1.62%) GEL, Apply 2 pumps sprays per arm once daily early each morning., Disp: 75 g, Rfl: 2  Current Facility-Administered Medications:    0.9 %  sodium chloride infusion, 500 mL, Intravenous, Once, [name],  [name], MD  EXAM:  VITALS per patient if applicable:  GENERAL: alert, oriented, appears well and in no acute distress  HEENT: atraumatic, conjunttiva clear, no obvious abnormalities on inspection of external nose and ears  NECK: normal movements of the head and neck  LUNGS: on inspection no signs of respiratory distress, breathing rate appears normal, no obvious gross SOB, gasping or wheezing  CV: no obvious cyanosis  MS: moves all visible extremities without noticeable abnormality  PSYCH/NEURO: pleasant and cooperative, no obvious depression or anxiety, speech and thought processing grossly intact  ASSESSMENT AND PLAN:  Discussed the following assessment and plan:   #1 acute urinary  retention in a patient with BPH which required reinsertion of Foley catheter following his surgery. -He has follow-up with urology on Monday for repeat voiding trial. -He was placed on oxybutynin 3 times daily for bladder spasms in the ER.  We did mention that it would be in his best interest to try to stop that over the weekend if possible in advance of his voiding trial Monday.  His pain is improved since he got the new catheter and he will try to leave the oxybutynin off over the weekend  #2 opioid-induced constipation. -Patient on Relistor after not seeing much benefit with MiraLAX or Senokot or Dulcolax.  Symptoms currently stable and hopefully can taper off opioids soon  #3 external hemorrhoids.  Per ER notes which were reviewed these were nonthrombosed.  -Hopefully can start sitz bath soon. -Consider Proctofoam HC to apply topically 4 times daily as needed    I discussed the assessment and treatment plan with the patient. The patient was provided an opportunity to ask questions and all were answered. The patient agreed with the plan and demonstrated an understanding of the instructions.   The patient was advised to call back or seek an in-person evaluation if the symptoms worsen or if  the condition fails to improve as anticipated.     [name], MD

## 2021-04-15 NOTE — Telephone Encounter (Signed)
Pharmacy called. The foam is not covered by insurance. She stated that the patient asked for the hydrocortisone cream.

## 2021-04-18 ENCOUNTER — Ambulatory Visit (INDEPENDENT_AMBULATORY_CARE_PROVIDER_SITE_OTHER): Payer: BC Managed Care – PPO | Admitting: Urology

## 2021-04-18 ENCOUNTER — Ambulatory Visit (INDEPENDENT_AMBULATORY_CARE_PROVIDER_SITE_OTHER): Payer: BC Managed Care – PPO | Admitting: Physician Assistant

## 2021-04-18 ENCOUNTER — Encounter: Payer: Self-pay | Admitting: Urology

## 2021-04-18 ENCOUNTER — Other Ambulatory Visit: Payer: Self-pay

## 2021-04-18 VITALS — BP 142/78 | HR 96 | Ht 75.0 in | Wt 250.0 lb

## 2021-04-18 DIAGNOSIS — R339 Retention of urine, unspecified: Secondary | ICD-10-CM

## 2021-04-18 LAB — BLADDER SCAN AMB NON-IMAGING

## 2021-04-18 MED ORDER — TAMSULOSIN HCL 0.4 MG PO CAPS: 0.4000 mg | ORAL_CAPSULE | Freq: Every evening | ORAL | 3 refills | Status: DC

## 2021-04-18 NOTE — Progress Notes (Signed)
04/18/2021 8:21 AM   Paul Morrison 26-Oct-1955 [id number]  Referring provider: Eulas Post, MD [place],  [place] [address]  Chief Complaint  Patient presents with   Urinary Retention    HPI: Patient had a recent lumbar fusion.  Developed retention postoperatively.  Foley catheter was placed for 700 mL.  The catheter had to be replaced 2 days later in the emergency room because of penile pain.  He had significant constipation after surgery.  Following surgery patient said his flow was slower and then he ended up with a catheter that day.  Since replacing the catheter it is much more comfortable.  Over the last several months his flow is reasonable.  It is a little bit shorter in terms of duration.  He feels empty unless he is having pain from gout.  He is having a lot of trouble with constipation with gabapentin for his back pain and goes a little bit more at night because of it he thinks.  He was voiding every 1-2 hours get up once or twice a night prior to this  He has had 2 spinal fusions.  Constipation is improving  He was put on Flomax postoperatively  No history of bladder surgery kidney stones or bladder infections   PMH: Past Medical History:  Diagnosis Date   Arthritis    Cancer ([place])    skin cancer left ear- MOHS   Hyperlipidemia    Hypertension    Low back pain     Surgical History: Past Surgical History:  Procedure Laterality Date   COLONOSCOPY     HERNIA REPAIR     INJECTION KNEE     both knees   keratatomy     knee arthroscopy     LUMBAR FUSION     MOHS SURGERY     POLYPECTOMY     TOTAL KNEE ARTHROPLASTY Right 11/24/2016   Procedure: RIGHT TOTAL KNEE ARTHROPLASTY;  Surgeon: [name], MD;  Location: [facility];  Service: Orthopedics;  Laterality: Right;   VASECTOMY      Home Medications:  Allergies as of 04/18/2021       Reactions   Cephalexin Other (See Comments)   Stomach cramping    Celecoxib Rash         Medication List        Accurate as of April 18, 2021  8:21 AM. If you have any questions, ask your nurse or doctor.          allopurinol 300 MG tablet Commonly known as: ZYLOPRIM TAKE ONE TABLET DAILY   ALPRAZolam 0.5 MG tablet Commonly known as: Xanax Take 1 tablet (0.5 mg total) by mouth 3 (three) times daily as needed for anxiety.   amoxicillin 500 MG capsule Commonly known as: AMOXIL Take 4 tablets one hour prior to dental procedure.   atorvastatin 40 MG tablet Commonly known as: LIPITOR TAKE ONE TABLET DAILY   bisacodyl 5 MG EC tablet Commonly known as: DULCOLAX Take 5 mg by mouth 3 (three) times daily as needed for moderate constipation.   colchicine 0.6 MG tablet TAKE ONE TABLET TWICE DAILY AS NEEDED FOR GOUT FLARE   diazepam 5 MG tablet Commonly known as: VALIUM Take 1-2 tablets (5-10 mg total) by mouth every 6 (six) hours as needed for muscle spasms.   gabapentin 300 MG capsule Commonly known as: NEURONTIN Take 600 mg by mouth 3 (three) times daily.   HYDROcodone-acetaminophen 5-325 MG tablet Commonly known as: NORCO/VICODIN Take  1-2 tablets by mouth every 4 (four) hours as needed for moderate pain. One po q 6 hours prn pain.  May refill on or after 06-25-17   hydrocortisone 2.5 % rectal cream Commonly known as: ANUSOL-HC Apply 2-3 times a day as needed.   indomethacin 50 MG capsule Commonly known as: INDOCIN TAKE ONE (1) CAPSULE THREE (3) TIMES EACH DAY AS NEEDED What changed: See the new instructions.   linaclotide 72 MCG capsule Commonly known as: Linzess Take 1 capsule (72 mcg total) by mouth daily before breakfast.   losartan 100 MG tablet Commonly known as: COZAAR TAKE ONE TABLET DAILY   oxybutynin 5 MG tablet Commonly known as: DITROPAN Take 1 tablet (5 mg total) by mouth 3 (three) times daily.   Oxycodone HCl 10 MG Tabs Take 1 tablet (10 mg total) by mouth every 4 (four) hours as needed for up to 12 doses for severe pain.    senna-docusate 8.6-50 MG tablet Commonly known as: Senokot-S Take 1 tablet by mouth at bedtime as needed for mild constipation or moderate constipation.   sildenafil 20 MG tablet Commonly known as: REVATIO TAKE 2 TO 5 TABLETS ONE HOUR PRIOR TO SEXUAL ACTIVITY   tamsulosin 0.4 MG Caps capsule Commonly known as: FLOMAX Take 0.4 mg by mouth every evening.   Testosterone 20.25 MG/ACT (1.62%) Gel Apply 2 pumps sprays per arm once daily early each morning.        Allergies:  Allergies  Allergen Reactions   Cephalexin Other (See Comments)    Stomach cramping    Celecoxib Rash    Family History: Family History  Problem Relation Age of Onset   Hyperlipidemia Mother    Hypertension Mother    Asthma Mother    Coronary artery disease Other    Aneurysm Other    Colon cancer Neg Hx    Colon polyps Neg Hx    Esophageal cancer Neg Hx    Rectal cancer Neg Hx    Stomach cancer Neg Hx     Social History:  reports that he has quit smoking. He has never used smokeless tobacco. He reports current alcohol use. He reports that he does not use drugs.  ROS:                                        Physical Exam: There were no vitals taken for this visit.  Constitutional:  Alert and oriented, No acute distress. HEENT: [place] AT, moist mucus membranes.  Trachea midline, no masses. Cardiovascular: No clubbing, cyanosis, or edema. Respiratory: Normal respiratory effort, no increased work of breathing. GI: Abdomen is soft, nontender, nondistended, no abdominal masses GU: Answers questions well but may be a little slower possibly due to medication.  40 g benign prostate.  Genitalia normal Skin: No rashes, bruises or suspicious lesions. Lymph: No cervical or inguinal adenopathy. Neurologic: Grossly intact, no focal deficits, moving all 4 extremities. Psychiatric: Normal mood and affect.  Laboratory Data: Lab Results  Component Value Date   WBC 9.4 04/12/2021   HGB  15.0 04/12/2021   HCT 44.1 04/12/2021   MCV 93.4 04/12/2021   PLT 249 04/12/2021    Lab Results  Component Value Date   CREATININE 0.89 04/12/2021    Lab Results  Component Value Date   PSA 1.22 12/10/2020   PSA 1.07 03/24/2020   PSA 1.03 05/28/2019    Lab Results  Component Value Date   TESTOSTERONE 173.34 (L) 03/18/2021    Lab Results  Component Value Date   HGBA1C 6.2 01/09/2011    Urinalysis    Component Value Date/Time   COLORURINE YELLOW 04/12/2021 0638   APPEARANCEUR CLEAR 04/12/2021 0638   LABSPEC 1.013 04/12/2021 0638   PHURINE 5.0 04/12/2021 0638   GLUCOSEU NEGATIVE 04/12/2021 0638   GLUCOSEU NEGATIVE 07/04/2010 1107   HGBUR MODERATE (A) 04/12/2021 0638   HGBUR trace-lysed 05/14/2008 0920   BILIRUBINUR NEGATIVE 04/12/2021 0638   KETONESUR NEGATIVE 04/12/2021 0638   PROTEINUR 100 (A) 04/12/2021 0638   UROBILINOGEN 0.2 07/04/2010 1107   NITRITE NEGATIVE 04/12/2021 0638   LEUKOCYTESUR SMALL (A) 04/12/2021 0601    Pertinent Imaging: Chart reviewed.  Assessment & Plan: Pathophysiology of retention discussed.  Trial of voiding discussed.  Timing of trial of voiding discussed.  See nurse practitioner this afternoon on Flomax with trial of voiding.  Stay on Flomax for the next many weeks or even long-term depending upon subjective response.  He should have follow-up in urology in a few months if he is successful to make sure he still doing fine  There are no diagnoses linked to this encounter.  No follow-ups on file.  Reece Packer, MD  [facility] [address] [place], [place] [address] [phone]

## 2021-04-18 NOTE — Progress Notes (Signed)
Patient returns to clinic today for repeat bladder scan.  He has been able to urinate.  He denies lower abdominal pain or difficulty voiding.  PVR 76 mL.  Results for orders placed or performed in visit on 04/18/21  Bladder Scan (Post Void Residual) in office  Result Value Ref Range   Scan Result 91m     Voiding trial passed.  Counseled patient to continue Flomax.  We will plan to recheck his residual in 6 weeks on Flomax.  Counseled him to return to clinic sooner with difficulty urinating, lower abdominal pain, abdominal distention, or dysuria.  He expressed understanding.  Follow up: Return in about 6 weeks (around 05/30/2021) for Symptom recheck with PVR.

## 2021-04-18 NOTE — Patient Instructions (Signed)
Stay hydrated and return this afternoon

## 2021-04-20 ENCOUNTER — Other Ambulatory Visit: Payer: Self-pay | Admitting: Family Medicine

## 2021-04-25 MED ORDER — TESTOSTERONE 20.25 MG/ACT (1.62%) TD GEL: TRANSDERMAL | 0 refills | Status: DC

## 2021-04-25 NOTE — Addendum Note (Signed)
Addended by: Alysia Penna A on: 04/25/2021 09:10 AM   Modules accepted: Orders

## 2021-04-25 NOTE — Telephone Encounter (Signed)
I sent this in

## 2021-04-25 NOTE — Telephone Encounter (Signed)
Please review in Dr. Erick Blinks absence.  Can you please change and send in this prescription?

## 2021-05-19 ENCOUNTER — Telehealth: Payer: Self-pay

## 2021-05-19 NOTE — Telephone Encounter (Signed)
I initiated a PA for the pt's Testosterone 20.25 mg. The PA was sent to plan and is awaiting a response by the pt's insurance. Lab results were also faxed to covermymeds for clinical information. Key- [id number].

## 2021-05-23 ENCOUNTER — Telehealth: Payer: Self-pay | Admitting: Family Medicine

## 2021-05-23 ENCOUNTER — Other Ambulatory Visit: Payer: Self-pay | Admitting: Family Medicine

## 2021-05-23 ENCOUNTER — Encounter: Payer: Self-pay | Admitting: Gastroenterology

## 2021-05-23 MED ORDER — AMOXICILLIN 500 MG PO CAPS: ORAL_CAPSULE | ORAL | 0 refills | Status: DC

## 2021-05-23 NOTE — Telephone Encounter (Signed)
Pt is calling and his a dental cleaning tomorrow and due to having knee replacement . Pt is ask for 4 amoxicillin to be sent to   [facility], [place] - [address]

## 2021-05-23 NOTE — Telephone Encounter (Signed)
Rx sent in. Spoke with the patient and he is aware the Rx has been sent to the pharmacy.

## 2021-05-31 ENCOUNTER — Ambulatory Visit: Payer: BC Managed Care – PPO | Admitting: Physician Assistant

## 2021-06-02 ENCOUNTER — Encounter: Payer: Self-pay | Admitting: Family Medicine

## 2021-06-03 ENCOUNTER — Other Ambulatory Visit: Payer: Self-pay

## 2021-06-03 ENCOUNTER — Ambulatory Visit (INDEPENDENT_AMBULATORY_CARE_PROVIDER_SITE_OTHER): Payer: BC Managed Care – PPO | Admitting: Physician Assistant

## 2021-06-03 ENCOUNTER — Encounter: Payer: Self-pay | Admitting: Physician Assistant

## 2021-06-03 VITALS — BP 183/112 | HR 96 | Ht 75.0 in | Wt 260.0 lb

## 2021-06-03 DIAGNOSIS — R339 Retention of urine, unspecified: Secondary | ICD-10-CM | POA: Diagnosis not present

## 2021-06-03 LAB — BLADDER SCAN AMB NON-IMAGING: SCA Result: 64

## 2021-06-03 NOTE — Patient Instructions (Signed)
Stop the Flomax (tamsulosin) early next week.  -If your urinary symptoms stay around the same, ok to stay off the medication and follow up with [place] as needed. -If your urinary symptoms get a little worse and you want to restart the Flomax, let us know so that we can plan to see you annually for follow-up. -If you develop difficulty urinating, the sensation of incomplete emptying, or lower abdominal pain, please call us immediately for further assistance.

## 2021-06-03 NOTE — Progress Notes (Signed)
06/03/2021 4:08 PM   Paul Morrison November 03, 1955 [id number]  CC: Chief Complaint  Patient presents with   Incomplete bladder emptying   HPI: Paul Morrison is a 65 y.o. male with PMH gout who developed urinary retention in September after undergoing lumbar fusion who presents today for repeat PVR and symptom recheck on Flomax.   Today he reports he continues to take Flomax.  He thinks his flow may be slightly stronger than at baseline, however he did not have any bothersome voiding symptoms prior to developing retention.  He wonders if he should continue the Flomax and prefers to go off of it given that he is on multiple other medications.  PVR 64 mL.  He is up-to-date with physicals with his PCP, who checks his PSA regularly.  Last PSA 1.22 on 12/10/2020.  PMH: Past Medical History:  Diagnosis Date   Arthritis    Cancer ([place])    skin cancer left ear- MOHS   Hyperlipidemia    Hypertension    Low back pain     Surgical History: Past Surgical History:  Procedure Laterality Date   COLONOSCOPY     HERNIA REPAIR     INJECTION KNEE     both knees   keratatomy     knee arthroscopy     LUMBAR FUSION     MOHS SURGERY     POLYPECTOMY     TOTAL KNEE ARTHROPLASTY Right 11/24/2016   Procedure: RIGHT TOTAL KNEE ARTHROPLASTY;  Surgeon: [name], MD;  Location: [facility];  Service: Orthopedics;  Laterality: Right;   VASECTOMY      Home Medications:  Allergies as of 06/03/2021       Reactions   Cephalexin Other (See Comments)   Stomach cramping    Celecoxib Rash        Medication List        Accurate as of June 03, 2021  4:08 PM. If you have any questions, ask your nurse or doctor.          allopurinol 300 MG tablet Commonly known as: ZYLOPRIM TAKE ONE TABLET DAILY   ALPRAZolam 0.5 MG tablet Commonly known as: Xanax Take 1 tablet (0.5 mg total) by mouth 3 (three) times daily as needed for anxiety.   amoxicillin 500 MG capsule Commonly known as:  AMOXIL Take 4 tablets one hour prior to dental procedure.   atorvastatin 40 MG tablet Commonly known as: LIPITOR TAKE ONE TABLET DAILY   bisacodyl 5 MG EC tablet Commonly known as: DULCOLAX Take 5 mg by mouth 3 (three) times daily as needed for moderate constipation.   colchicine 0.6 MG tablet TAKE ONE TABLET TWICE DAILY AS NEEDED FOR GOUT FLARE   diazepam 5 MG tablet Commonly known as: VALIUM Take 1-2 tablets (5-10 mg total) by mouth every 6 (six) hours as needed for muscle spasms.   gabapentin 300 MG capsule Commonly known as: NEURONTIN Take 600 mg by mouth 3 (three) times daily.   HYDROcodone-acetaminophen 5-325 MG tablet Commonly known as: NORCO/VICODIN Take 1-2 tablets by mouth every 4 (four) hours as needed for moderate pain. One po q 6 hours prn pain.  May refill on or after 06-25-17   hydrocortisone 2.5 % rectal cream Commonly known as: ANUSOL-HC Apply 2-3 times a day as needed.   HYDROmorphone 2 MG tablet Commonly known as: DILAUDID Take by mouth.   indomethacin 50 MG capsule Commonly known as: INDOCIN TAKE ONE (1) CAPSULE THREE (3) TIMES EACH DAY AS NEEDED  losartan 100 MG tablet Commonly known as: COZAAR TAKE ONE TABLET DAILY   oxybutynin 5 MG tablet Commonly known as: DITROPAN Take 1 tablet (5 mg total) by mouth 3 (three) times daily.   Oxycodone HCl 10 MG Tabs Take 1 tablet (10 mg total) by mouth every 4 (four) hours as needed for up to 12 doses for severe pain.   senna-docusate 8.6-50 MG tablet Commonly known as: Senokot-S Take 1 tablet by mouth at bedtime as needed for mild constipation or moderate constipation.   sildenafil 20 MG tablet Commonly known as: REVATIO TAKE 2 TO 5 TABLETS ONE HOUR PRIOR TO SEXUAL ACTIVITY   tamsulosin 0.4 MG Caps capsule Commonly known as: FLOMAX Take 1 capsule (0.4 mg total) by mouth every evening.   Testosterone 20.25 MG/ACT (1.62%) Gel Apply 2 pumps sprays per arm once daily early each morning.         Allergies:  Allergies  Allergen Reactions   Cephalexin Other (See Comments)    Stomach cramping    Celecoxib Rash    Family History: Family History  Problem Relation Age of Onset   Hyperlipidemia Mother    Hypertension Mother    Asthma Mother    Coronary artery disease Other    Aneurysm Other    Colon cancer Neg Hx    Colon polyps Neg Hx    Esophageal cancer Neg Hx    Rectal cancer Neg Hx    Stomach cancer Neg Hx     Social History:   reports that he has quit smoking. He has never used smokeless tobacco. He reports current alcohol use. He reports that he does not use drugs.  Physical Exam: BP (!) 183/112   Pulse 96   Ht 6\' 3"  (1.905 m)   Wt 260 lb (117.9 kg)   BMI 32.50 kg/m   Constitutional:  Alert and oriented, no acute distress, nontoxic appearing HEENT: [place], AT Cardiovascular: No clubbing, cyanosis, or edema Respiratory: Normal respiratory effort, no increased work of breathing Skin: No rashes, bruises or suspicious lesions Neurologic: Grossly intact, no focal deficits, moving all 4 extremities Psychiatric: Normal mood and affect  Laboratory Data: Results for orders placed or performed in visit on 06/03/21  Bladder Scan (Post Void Residual) in office  Result Value Ref Range   SCA Result 64    Assessment & Plan:   1. Incomplete bladder emptying He continues to empty well.  We discussed that his recent episode of urinary retention was more likely due to having undergone spinal surgery did not underlying significant BPH.  We discussed stopping Flomax with plans to resume it if his voiding symptoms significantly worsen.  I counseled him to wait until early next week in case he goes back into retention off of Flomax so that we may assist him in clinic with a bladder scan and/or catheter placement, though I think this is unlikely to occur.  Patient is in agreement with this plan.  If he stays off Flomax, okay to follow-up with [place] as needed.  Otherwise, would like  to see him annually for BPH visit. - Bladder Scan (Post Void Residual) in office  Return if symptoms worsen or fail to improve.  Debroah Loop, PA-C  [facility] [address], [place], [place] [address] [phone]

## 2021-06-03 NOTE — Telephone Encounter (Signed)
Dr. Sarajane Jews had sent in a refill in your absence. [place] for a refill?

## 2021-06-05 MED ORDER — TESTOSTERONE 20.25 MG/ACT (1.62%) TD GEL: TRANSDERMAL | 5 refills | Status: DC

## 2021-06-05 NOTE — Telephone Encounter (Signed)
I refilled testosterone with larger quantity.

## 2021-06-20 ENCOUNTER — Other Ambulatory Visit: Payer: Self-pay | Admitting: Family Medicine

## 2021-07-26 ENCOUNTER — Other Ambulatory Visit: Payer: Self-pay | Admitting: Family Medicine

## 2021-07-28 ENCOUNTER — Encounter: Payer: Self-pay | Admitting: Family Medicine

## 2021-07-28 DIAGNOSIS — H900 Conductive hearing loss, bilateral: Secondary | ICD-10-CM

## 2021-07-29 ENCOUNTER — Other Ambulatory Visit: Payer: Self-pay

## 2021-07-29 DIAGNOSIS — H900 Conductive hearing loss, bilateral: Secondary | ICD-10-CM

## 2021-08-12 NOTE — Telephone Encounter (Signed)
Sent to [employer] for assistance.

## 2021-09-21 ENCOUNTER — Other Ambulatory Visit: Payer: Self-pay | Admitting: Family Medicine

## 2021-09-28 ENCOUNTER — Other Ambulatory Visit: Payer: Self-pay | Admitting: Family Medicine

## 2021-10-19 ENCOUNTER — Encounter: Payer: Self-pay | Admitting: Family Medicine

## 2021-10-19 DIAGNOSIS — M109 Gout, unspecified: Secondary | ICD-10-CM

## 2021-10-19 NOTE — Addendum Note (Signed)
Addended by: Nilda Riggs on: 10/19/2021 12:55 PM ? ? Modules accepted: Orders ? ?

## 2021-10-19 NOTE — Telephone Encounter (Signed)
Chart updated with Covid booster ?

## 2021-10-20 ENCOUNTER — Other Ambulatory Visit (INDEPENDENT_AMBULATORY_CARE_PROVIDER_SITE_OTHER): Payer: BC Managed Care – PPO

## 2021-10-20 DIAGNOSIS — M109 Gout, unspecified: Secondary | ICD-10-CM

## 2021-10-20 LAB — URIC ACID: Uric Acid, Serum: 5.3 mg/dL (ref 4.0–7.8)

## 2021-10-26 ENCOUNTER — Other Ambulatory Visit: Payer: Self-pay | Admitting: Family Medicine

## 2021-10-28 ENCOUNTER — Ambulatory Visit (INDEPENDENT_AMBULATORY_CARE_PROVIDER_SITE_OTHER): Payer: BC Managed Care – PPO | Admitting: Family Medicine

## 2021-10-28 ENCOUNTER — Encounter: Payer: Self-pay | Admitting: Family Medicine

## 2021-10-28 VITALS — BP 122/82 | HR 95 | Temp 98.1°F | Ht 75.0 in | Wt 254.3 lb

## 2021-10-28 DIAGNOSIS — K5900 Constipation, unspecified: Secondary | ICD-10-CM

## 2021-10-28 DIAGNOSIS — G4700 Insomnia, unspecified: Secondary | ICD-10-CM

## 2021-10-28 DIAGNOSIS — M1A9XX0 Chronic gout, unspecified, without tophus (tophi): Secondary | ICD-10-CM

## 2021-10-28 MED ORDER — TRAZODONE HCL 50 MG PO TABS
25.0000 mg | ORAL_TABLET | Freq: Every evening | ORAL | 1 refills | Status: DC | PRN
Start: 2021-10-28 — End: 2022-01-24

## 2021-10-28 NOTE — Progress Notes (Signed)
? ?Established Patient Office Visit ? ?Subjective:  ?Patient ID: Paul Morrison, male    DOB: 1955/08/01  Age: 66 y.o. MRN: [id number] ? ?CC:  ?Chief Complaint  ?Patient presents with  ? Travel Consult  ? Medication Consultation  ? ? ?HPI ?Paul Morrison presents to discuss several things as follows ? ?Upcoming trip to [place].  Will be there 26 days.  He is especially concerned about insomnia issues.  He is currently taking some type of herbal medication with some improvement but still frequently gets 4 or 5 hours of sleep at night.  Does usually drink some alcohol around 5 to 6 PM.  No late day use of caffeine.  Usually falls asleep okay but wakes up around 2 to 3 AM and has difficulty getting back to sleep. ? ?Intermittent constipation issues.  Has taken occasional Dulcolax in the past.  Usually has bowel movement about every other day but frequently has to strain.  He is trying to get high-fiber food sources such as fruit frequently. ? ?He is anticipating eating a lot of seafood with his trip to [place].  He specifically has concerns regarding his gout.  He is on allopurinol with no recent breakthrough.  He had inquired about possibly going back on colchicine daily as a preventative. ? ?Past Medical History:  ?Diagnosis Date  ? Arthritis   ? Cancer [facility]   ? skin cancer left ear- MOHS  ? Hyperlipidemia   ? Hypertension   ? Low back pain   ? ? ?Past Surgical History:  ?Procedure Laterality Date  ? COLONOSCOPY    ? HERNIA REPAIR    ? INJECTION KNEE    ? both knees  ? keratatomy    ? knee arthroscopy    ? LUMBAR FUSION    ? MOHS SURGERY    ? POLYPECTOMY    ? TOTAL KNEE ARTHROPLASTY Right 11/24/2016  ? Procedure: RIGHT TOTAL KNEE ARTHROPLASTY;  Surgeon: [name], MD;  Location: [facility];  Service: Orthopedics;  Laterality: Right;  ? VASECTOMY    ? ? ?Family History  ?Problem Relation Age of Onset  ? Hyperlipidemia Mother   ? Hypertension Mother   ? Asthma Mother   ? Coronary artery disease Other   ? Aneurysm Other    ? Colon cancer Neg Hx   ? Colon polyps Neg Hx   ? Esophageal cancer Neg Hx   ? Rectal cancer Neg Hx   ? Stomach cancer Neg Hx   ? ? ?Social History  ? ?Socioeconomic History  ? Marital status: Married  ?  Spouse name: Not on file  ? Number of children: Not on file  ? Years of education: Not on file  ? Highest education level: Some college, no degree  ?Occupational History  ? Not on file  ?Tobacco Use  ? Smoking status: Former  ? Smokeless tobacco: Never  ?Vaping Use  ? Vaping Use: Never used  ?Substance and Sexual Activity  ? Alcohol use: Yes  ?  Comment: Occas  ? Drug use: No  ? Sexual activity: Yes  ?Other Topics Concern  ? Not on file  ?Social History Narrative  ? Not on file  ? ?Social Determinants of Health  ? ?Financial Resource Strain: Low Risk   ? Difficulty of Paying Living Expenses: Not hard at all  ?Food Insecurity: No Food Insecurity  ? Worried About Charity fundraiser in the Last Year: Never true  ? Ran Out of Food in the Last  Year: Never true  ?Transportation Needs: No Transportation Needs  ? Lack of Transportation (Medical): No  ? Lack of Transportation (Non-Medical): No  ?Physical Activity: Sufficiently Active  ? Days of Exercise per Week: 7 days  ? Minutes of Exercise per Session: 120 min  ?Stress: Stress Concern Present  ? Feeling of Stress : To some extent  ?Social Connections: Moderately Isolated  ? Frequency of Communication with Friends and Family: Three times a week  ? Frequency of Social Gatherings with Friends and Family: Once a week  ? Attends Religious Services: Never  ? Active Member of Clubs or Organizations: No  ? Attends Archivist Meetings: Not on file  ? Marital Status: Married  ?Intimate Partner Violence: Not on file  ? ? ?Outpatient Medications Prior to Visit  ?Medication Sig Dispense Refill  ? allopurinol (ZYLOPRIM) 300 MG tablet TAKE ONE TABLET DAILY 90 tablet 0  ? ALPRAZolam (XANAX) 0.5 MG tablet Take 1 tablet (0.5 mg total) by mouth 3 (three) times daily as needed  for anxiety. 30 tablet 0  ? amoxicillin (AMOXIL) 500 MG capsule Take 4 tablets one hour prior to dental procedure. 4 capsule 0  ? atorvastatin (LIPITOR) 40 MG tablet TAKE ONE TABLET DAILY 90 tablet 0  ? colchicine 0.6 MG tablet TAKE ONE TABLET TWICE DAILY AS NEEDED FOR GOUT FLARE 60 tablet 3  ? Glucosamine-Chondroitin 500-400 MG CAPS 1,200 mg.    ? indomethacin (INDOCIN) 50 MG capsule TAKE ONE (1) CAPSULE THREE (3) TIMES EACH DAY AS NEEDED 30 capsule 1  ? losartan (COZAAR) 100 MG tablet TAKE ONE TABLET DAILY 90 tablet 0  ? Phenylephrine-guaiFENesin (GENEXA LA PO)     ? senna-docusate (SENOKOT-S) 8.6-50 MG tablet Take 1 tablet by mouth at bedtime as needed for mild constipation or moderate constipation. 30 tablet 0  ? sildenafil (REVATIO) 20 MG tablet TAKE 2 TO 5 TABLETS ONE HOUR PRIOR TO SEXUAL ACTIVITY 50 tablet 5  ? terbinafine (LAMISIL) 1 % cream     ? Testosterone 20.25 MG/ACT (1.62%) GEL Apply 2 pumps sprays per arm once daily early each morning. 150 g 5  ? HYDROcodone-acetaminophen (NORCO/VICODIN) 5-325 MG tablet Take 1-2 tablets by mouth every 4 (four) hours as needed for moderate pain. One po q 6 hours prn pain.  May refill on or after 06-25-17 30 tablet 0  ? bisacodyl (DULCOLAX) 5 MG EC tablet Take 5 mg by mouth 3 (three) times daily as needed for moderate constipation. (Patient not taking: Reported on 06/03/2021)    ? diazepam (VALIUM) 5 MG tablet Take 1-2 tablets (5-10 mg total) by mouth every 6 (six) hours as needed for muscle spasms. 30 tablet 0  ? gabapentin (NEURONTIN) 300 MG capsule Take 600 mg by mouth 3 (three) times daily. (Patient not taking: Reported on 06/03/2021)    ? hydrocortisone (ANUSOL-HC) 2.5 % rectal cream Apply 2-3 times a day as needed. (Patient not taking: Reported on 06/03/2021) 30 g 0  ? HYDROmorphone (DILAUDID) 2 MG tablet Take by mouth.    ? oxybutynin (DITROPAN) 5 MG tablet Take 1 tablet (5 mg total) by mouth 3 (three) times daily. 60 tablet 0  ? oxyCODONE 10 MG TABS Take 1 tablet  (10 mg total) by mouth every 4 (four) hours as needed for up to 12 doses for severe pain. (Patient not taking: Reported on 06/03/2021) 12 tablet 0  ? tamsulosin (FLOMAX) 0.4 MG CAPS capsule Take 1 capsule (0.4 mg total) by mouth every evening. 30 capsule  3  ? ?Facility-Administered Medications Prior to Visit  ?Medication Dose Route Frequency Provider Last Rate Last Admin  ? 0.9 %  sodium chloride infusion  500 mL Intravenous Once Doran Stabler, MD      ? ? ?Allergies  ?Allergen Reactions  ? Cephalexin Other (See Comments)  ?  Stomach cramping   ? Celecoxib Rash  ? ? ?ROS ?Review of Systems  ?Constitutional:  Negative for appetite change, chills, fever and unexpected weight change.  ?Gastrointestinal:  Positive for constipation. Negative for blood in stool.  ?Psychiatric/Behavioral:  Positive for sleep disturbance.   ? ?  ?Objective:  ?  ?Physical Exam ?Vitals reviewed.  ?Constitutional:   ?   Appearance: Normal appearance.  ?Cardiovascular:  ?   Rate and Rhythm: Normal rate and regular rhythm.  ?Pulmonary:  ?   Effort: Pulmonary effort is normal.  ?   Breath sounds: Normal breath sounds. No wheezing or rales.  ?Musculoskeletal:  ?   Right lower leg: No edema.  ?   Left lower leg: No edema.  ?Neurological:  ?   Mental Status: He is alert.  ? ? ?BP 122/82 (BP Location: Left Arm, Patient Position: Sitting, Cuff Size: Normal)   Pulse 95   Temp 98.1 ?F (36.7 ?C) (Oral)   Ht '6\' 3"'$  (1.905 m)   Wt 254 lb 4.8 oz (115.3 kg)   SpO2 98%   BMI 31.79 kg/m?  ?Wt Readings from Last 3 Encounters:  ?10/28/21 254 lb 4.8 oz (115.3 kg)  ?06/03/21 260 lb (117.9 kg)  ?04/18/21 250 lb (113.4 kg)  ? ? ? ?Health Maintenance Due  ?Topic Date Due  ? HIV Screening  Never done  ? Zoster Vaccines- Shingrix (2 of 2) 08/06/2019  ? COLONOSCOPY (Pts 45-80yr Insurance coverage will need to be confirmed)  05/02/2021  ? COVID-19 Vaccine (5 - Booster for Pfizer series) 07/26/2021  ? ? ?There are no preventive care reminders to display for this  patient. ? ?Lab Results  ?Component Value Date  ? TSH 3.29 03/24/2020  ? ?Lab Results  ?Component Value Date  ? WBC 9.4 04/12/2021  ? HGB 15.0 04/12/2021  ? HCT 44.1 04/12/2021  ? MCV 93.4 04/12/2021  ? P

## 2021-10-28 NOTE — Patient Instructions (Addendum)
Consider Miralax 17 grams daily as needed for constipation.  ? ?Consider once daily Colchicine during travel for gout prevention.   ?

## 2021-12-22 ENCOUNTER — Other Ambulatory Visit: Payer: Self-pay | Admitting: Family Medicine

## 2021-12-22 NOTE — Telephone Encounter (Signed)
Last lipid labs- 12/10/20 Last OV- 10/10/21  No future OV scheduled.   Can this patient receive a refill?

## 2022-01-18 ENCOUNTER — Encounter: Payer: Self-pay | Admitting: Family Medicine

## 2022-01-20 ENCOUNTER — Encounter: Payer: Self-pay | Admitting: Family Medicine

## 2022-01-20 ENCOUNTER — Telehealth (INDEPENDENT_AMBULATORY_CARE_PROVIDER_SITE_OTHER): Payer: BC Managed Care – PPO | Admitting: Family Medicine

## 2022-01-20 VITALS — Ht 75.0 in | Wt 254.3 lb

## 2022-01-20 DIAGNOSIS — M1000 Idiopathic gout, unspecified site: Secondary | ICD-10-CM | POA: Diagnosis not present

## 2022-01-20 DIAGNOSIS — N529 Male erectile dysfunction, unspecified: Secondary | ICD-10-CM | POA: Diagnosis not present

## 2022-01-20 DIAGNOSIS — G2581 Restless legs syndrome: Secondary | ICD-10-CM

## 2022-01-20 DIAGNOSIS — F5104 Psychophysiologic insomnia: Secondary | ICD-10-CM | POA: Diagnosis not present

## 2022-01-20 MED ORDER — ALLOPURINOL 300 MG PO TABS: 300.0000 mg | ORAL_TABLET | Freq: Every day | ORAL | 3 refills | Status: DC

## 2022-01-20 MED ORDER — TADALAFIL 20 MG PO TABS: ORAL_TABLET | ORAL | 5 refills | Status: DC

## 2022-01-20 MED ORDER — PRAMIPEXOLE DIHYDROCHLORIDE 0.125 MG PO TABS: ORAL_TABLET | ORAL | 5 refills | Status: DC

## 2022-01-24 ENCOUNTER — Encounter: Payer: Self-pay | Admitting: Family Medicine

## 2022-01-24 ENCOUNTER — Other Ambulatory Visit: Payer: Self-pay | Admitting: Family Medicine

## 2022-01-24 MED ORDER — TRAZODONE HCL 50 MG PO TABS: 25.0000 mg | ORAL_TABLET | Freq: Every evening | ORAL | 1 refills | Status: DC | PRN

## 2022-02-22 ENCOUNTER — Encounter: Payer: Self-pay | Admitting: Family Medicine

## 2022-02-24 ENCOUNTER — Encounter: Payer: Self-pay | Admitting: Family Medicine

## 2022-02-24 ENCOUNTER — Ambulatory Visit (INDEPENDENT_AMBULATORY_CARE_PROVIDER_SITE_OTHER): Payer: BC Managed Care – PPO | Admitting: Family Medicine

## 2022-02-24 VITALS — BP 132/88 | HR 73 | Temp 97.9°F | Ht 75.0 in | Wt 256.8 lb

## 2022-02-24 DIAGNOSIS — G4700 Insomnia, unspecified: Secondary | ICD-10-CM

## 2022-02-24 MED ORDER — ALPRAZOLAM 0.5 MG PO TABS: 0.5000 mg | ORAL_TABLET | Freq: Every evening | ORAL | 0 refills | Status: DC | PRN

## 2022-02-24 NOTE — Progress Notes (Signed)
Established Patient Office Visit  Subjective   Patient ID: Paul Morrison, male    DOB: 1955/12/08  Age: 66 y.o. MRN: [id number]  Chief Complaint  Patient presents with   Insomnia   Skin Discoloration    HPI   Seen with insomnia.  He has battled this somewhat for years.  He previously has taken things like Ambien but had concern for sleepwalking.  Tried melatonin without much success.  Recently was placed on trazodone which does work but has caused bad dreams consistently when he is taking this.  He also has concerns that the trazodone may have triggered some restless leg symptoms.  He recently went on Mirapex 0.25 mg nightly which is controlling restless leg symptoms.  He has taken low-dose alprazolam in the past on infrequent basis for severe insomnia that seem to help.  Sometimes has difficulty falling asleep but also staying asleep.  Avoids late the use of caffeine.  Past Medical History:  Diagnosis Date   Arthritis    Cancer ([facility])    skin cancer left ear- MOHS   Hyperlipidemia    Hypertension    Low back pain    Past Surgical History:  Procedure Laterality Date   COLONOSCOPY     HERNIA REPAIR     INJECTION KNEE     both knees   keratatomy     knee arthroscopy     LUMBAR FUSION     MOHS SURGERY     POLYPECTOMY     TOTAL KNEE ARTHROPLASTY Right 11/24/2016   Procedure: RIGHT TOTAL KNEE ARTHROPLASTY;  Surgeon: [name], MD;  Location: [facility];  Service: Orthopedics;  Laterality: Right;   VASECTOMY      reports that he has quit smoking. He has never used smokeless tobacco. He reports current alcohol use. He reports that he does not use drugs. family history includes Aneurysm in an other family member; Asthma in his mother; Coronary artery disease in an other family member; Hyperlipidemia in his mother; Hypertension in his mother. Allergies  Allergen Reactions   Cephalexin Other (See Comments)    Stomach cramping    Celecoxib Rash    Review of Systems   Constitutional:  Negative for weight loss.  Cardiovascular:  Negative for chest pain.  Psychiatric/Behavioral:  The patient has insomnia.       Objective:     BP 132/88 (BP Location: Left Arm, Cuff Size: Normal)   Pulse 73   Temp 97.9 F (36.6 C) (Oral)   Ht '6\' 3"'$  (1.905 m)   Wt 256 lb 12.8 oz (116.5 kg)   SpO2 97%   BMI 32.10 kg/m  BP Readings from Last 3 Encounters:  02/24/22 132/88  10/28/21 122/82  06/03/21 (!) 183/112   Wt Readings from Last 3 Encounters:  02/24/22 256 lb 12.8 oz (116.5 kg)  01/20/22 254 lb 4.8 oz (115.3 kg)  10/28/21 254 lb 4.8 oz (115.3 kg)      Physical Exam Vitals reviewed.  Constitutional:      Appearance: Normal appearance.  Cardiovascular:     Rate and Rhythm: Normal rate and regular rhythm.  Pulmonary:     Effort: Pulmonary effort is normal.     Breath sounds: Normal breath sounds.  Neurological:     Mental Status: He is alert.      No results found for any visits on 02/24/22.    The 10-year ASCVD risk score (Arnett DK, et al., 2019) is: 15.6%    Assessment & Plan:  Insomnia.  He does have component of restless leg symptoms which have improved with Mirapex.  Unfortunately, developed some bad dreams with trazodone and also had some withdrawal type symptoms when trying to suddenly come off the trazodone.  He has suspicion that the trazodone may have triggered worsening restless leg symptoms.  Has tried multiple other things as above including Ambien and melatonin without success.  -Sleep hygiene discussed. -Continue to avoid late day use of caffeine -Taper off trazodone by reducing 25 mg every 1 to 2 weeks until off.  He is currently on 100 mg dose. -We agreed to short-term use of alprazolam 0.5 mg which he has used successfully in the past.  He will try to avoid nightly use.  We did mention longer acting benzo doses of Klonopin which is also been used for restless leg symptoms which might be a better long-term option to  consider  No follow-ups on file.    [name], MD

## 2022-02-24 NOTE — Patient Instructions (Signed)
Reduce the Trazodone to one and one half tablets by mouth nightly for one week and then continue reducing by 25 mg at one week intervals until off

## 2022-03-22 ENCOUNTER — Other Ambulatory Visit: Payer: Self-pay | Admitting: Family Medicine

## 2022-03-30 ENCOUNTER — Encounter: Payer: Self-pay | Admitting: Family Medicine

## 2022-03-31 ENCOUNTER — Other Ambulatory Visit: Payer: Self-pay

## 2022-03-31 DIAGNOSIS — G2581 Restless legs syndrome: Secondary | ICD-10-CM

## 2022-03-31 MED ORDER — PRAMIPEXOLE DIHYDROCHLORIDE 0.125 MG PO TABS: ORAL_TABLET | ORAL | 11 refills | Status: DC

## 2022-04-11 ENCOUNTER — Other Ambulatory Visit: Payer: Self-pay | Admitting: Family Medicine

## 2022-04-26 ENCOUNTER — Other Ambulatory Visit: Payer: Self-pay | Admitting: Family Medicine

## 2022-04-27 NOTE — Telephone Encounter (Signed)
Patient has Nurse Visit appointment scheduled on 04/28/22 at 2:15 pm.                                     Last lipid labs- 12/10/20.  Can a 30 day supply be sent and lab orders placed?   *I'll contact patient to schedule an early morning lab appointment.   Please advise

## 2022-04-28 ENCOUNTER — Ambulatory Visit (INDEPENDENT_AMBULATORY_CARE_PROVIDER_SITE_OTHER): Payer: BC Managed Care – PPO

## 2022-04-28 ENCOUNTER — Other Ambulatory Visit: Payer: Self-pay

## 2022-04-28 ENCOUNTER — Ambulatory Visit: Payer: BC Managed Care – PPO

## 2022-04-28 DIAGNOSIS — Z23 Encounter for immunization: Secondary | ICD-10-CM

## 2022-04-28 DIAGNOSIS — Z5181 Encounter for therapeutic drug level monitoring: Secondary | ICD-10-CM

## 2022-04-28 DIAGNOSIS — E782 Mixed hyperlipidemia: Secondary | ICD-10-CM

## 2022-04-28 DIAGNOSIS — R7989 Other specified abnormal findings of blood chemistry: Secondary | ICD-10-CM

## 2022-04-28 DIAGNOSIS — G2581 Restless legs syndrome: Secondary | ICD-10-CM

## 2022-04-28 MED ORDER — PRAMIPEXOLE DIHYDROCHLORIDE 0.125 MG PO TABS: ORAL_TABLET | ORAL | 11 refills | Status: DC

## 2022-04-28 NOTE — Telephone Encounter (Signed)
Spoke with patient. Lab orders placed.   Patient stated he will go to [facility] on 05/03/22 for lab draw.

## 2022-05-03 ENCOUNTER — Other Ambulatory Visit: Payer: BC Managed Care – PPO

## 2022-05-03 DIAGNOSIS — D582 Other hemoglobinopathies: Secondary | ICD-10-CM

## 2022-05-03 DIAGNOSIS — E782 Mixed hyperlipidemia: Secondary | ICD-10-CM

## 2022-05-03 DIAGNOSIS — Z5181 Encounter for therapeutic drug level monitoring: Secondary | ICD-10-CM

## 2022-05-03 LAB — COMPREHENSIVE METABOLIC PANEL
ALT: 46 U/L (ref 0–53)
AST: 30 U/L (ref 0–37)
Albumin: 4.3 g/dL (ref 3.5–5.2)
Alkaline Phosphatase: 45 U/L (ref 39–117)
BUN: 22 mg/dL (ref 6–23)
CO2: 26 mEq/L (ref 19–32)
Calcium: 9.5 mg/dL (ref 8.4–10.5)
Chloride: 102 mEq/L (ref 96–112)
Creatinine, Ser: 1.18 mg/dL (ref 0.40–1.50)
GFR: 64.86 mL/min (ref >60.00)
Glucose, Bld: 101 mg/dL — ABNORMAL HIGH (ref 70–99)
Potassium: 4.1 mEq/L (ref 3.5–5.1)
Sodium: 139 mEq/L (ref 135–145)
Total Bilirubin: 0.9 mg/dL (ref 0.2–1.2)
Total Protein: 6.7 g/dL (ref 6.0–8.3)

## 2022-05-03 LAB — CBC WITH DIFFERENTIAL/PLATELET
Basophils Absolute: 0 10*3/uL (ref 0.0–0.1)
Basophils Relative: 0.5 % (ref 0.0–3.0)
Eosinophils Absolute: 0.3 10*3/uL (ref 0.0–0.7)
Eosinophils Relative: 4.6 % (ref 0.0–5.0)
HCT: 50.6 % (ref 39.0–52.0)
Hemoglobin: 17.1 g/dL — ABNORMAL HIGH (ref 13.0–17.0)
Lymphocytes Relative: 28.4 % (ref 12.0–46.0)
Lymphs Abs: 1.7 10*3/uL (ref 0.7–4.0)
MCHC: 33.8 g/dL (ref 30.0–36.0)
MCV: 94 fl (ref 78.0–100.0)
Monocytes Absolute: 0.7 10*3/uL (ref 0.1–1.0)
Monocytes Relative: 12 % (ref 3.0–12.0)
Neutro Abs: 3.3 10*3/uL (ref 1.4–7.7)
Neutrophils Relative %: 54.5 % (ref 43.0–77.0)
Platelets: 159 10*3/uL (ref 150.0–400.0)
RBC: 5.38 Mil/uL (ref 4.22–5.81)
RDW: 13.7 % (ref 11.5–15.5)
WBC: 6.1 10*3/uL (ref 4.0–10.5)

## 2022-05-03 LAB — LIPID PANEL
Cholesterol: 197 mg/dL (ref 0–200)
HDL: 46.3 mg/dL (ref >39.00)
NonHDL: 151.15
Total CHOL/HDL Ratio: 4
Triglycerides: 209 mg/dL — ABNORMAL HIGH (ref 0.0–149.0)
VLDL: 41.8 mg/dL — ABNORMAL HIGH (ref 0.0–40.0)

## 2022-05-03 LAB — LDL CHOLESTEROL, DIRECT: Direct LDL: 116 mg/dL

## 2022-05-03 LAB — PSA: PSA: 1.53 ng/mL (ref 0.10–4.00)

## 2022-05-03 NOTE — Addendum Note (Signed)
Addended by: Jodi Marble, Shequita Peplinski I on: 05/03/2022 07:55 AM   Modules accepted: Orders

## 2022-06-05 ENCOUNTER — Encounter: Payer: Self-pay | Admitting: Family Medicine

## 2022-06-05 DIAGNOSIS — Z23 Encounter for immunization: Secondary | ICD-10-CM

## 2022-06-05 DIAGNOSIS — G2581 Restless legs syndrome: Secondary | ICD-10-CM

## 2022-06-19 ENCOUNTER — Institutional Professional Consult (permissible substitution): Payer: BC Managed Care – PPO | Admitting: Neurology

## 2022-06-28 ENCOUNTER — Encounter: Payer: Self-pay | Admitting: Neurology

## 2022-06-28 ENCOUNTER — Ambulatory Visit (INDEPENDENT_AMBULATORY_CARE_PROVIDER_SITE_OTHER): Payer: BC Managed Care – PPO | Admitting: Neurology

## 2022-06-28 VITALS — BP 140/91 | HR 86 | Ht 75.0 in | Wt 258.0 lb

## 2022-06-28 DIAGNOSIS — G2581 Restless legs syndrome: Secondary | ICD-10-CM

## 2022-06-28 DIAGNOSIS — G4700 Insomnia, unspecified: Secondary | ICD-10-CM

## 2022-06-28 DIAGNOSIS — E669 Obesity, unspecified: Secondary | ICD-10-CM

## 2022-06-28 DIAGNOSIS — R0683 Snoring: Secondary | ICD-10-CM

## 2022-06-28 DIAGNOSIS — Z9189 Other specified personal risk factors, not elsewhere classified: Secondary | ICD-10-CM

## 2022-06-28 NOTE — Patient Instructions (Addendum)
It was nice to meet you both.   We will check blood work today and call you with the test results.  For restless legs, you can continue the pramipexole as prescribed by your PCP.  I recommend you reduce your daily alcohol consumption and especially not drink alcohol before bedtime.  You can try Melatonin at night for sleep: take 3 mg, one to 2 hours before your bedtime. You can go up to 5 mg if needed, even 10 mg. It is over the counter and comes in pill form, chewable form and spray, if you prefer.     Based on your symptoms and your exam I believe you are at risk for obstructive sleep apnea (aka OSA). We should proceed with a sleep study to determine whether you do or do not have OSA and how severe it is. Even, if you have mild OSA, I may want you to consider treatment with CPAP, as treatment of even borderline or mild sleep apnea can result and improvement of symptoms such as sleep disruption, daytime sleepiness, nighttime bathroom breaks, restless leg symptoms, improvement of headache syndromes, even improved mood disorder.   As explained, an attended sleep study (meaning you get to stay overnight in the sleep lab), lets [place] monitor sleep-related behaviors such as sleep talking and leg movements in sleep, in addition to monitoring for sleep apnea.  A home sleep test is a screening tool for sleep apnea diagnosis only, but unfortunately, does not help with any other sleep-related diagnoses.  Please remember, the long-term risks and ramifications of untreated moderate to severe obstructive sleep apnea may include (but are not limited to): increased risk for cardiovascular disease, including congestive heart failure, stroke, difficult to control hypertension, treatment resistant obesity, arrhythmias, especially irregular heartbeat commonly known as A. Fib. (atrial fibrillation); even type 2 diabetes has been linked to untreated OSA.   Other correlations that untreated obstructive sleep apnea include  macular edema which is swelling of the retina in the eyes, droopy eyelid syndrome, and elevated hemoglobin and hematocrit levels (often referred to as polycythemia).  Sleep apnea can cause disruption of sleep and sleep deprivation in most cases, which, in turn, can cause recurrent headaches, problems with memory, mood, concentration, focus, and vigilance. Most people with untreated sleep apnea report excessive daytime sleepiness, which can affect their ability to drive. Please do not drive or use heavy equipment or machinery, if you feel sleepy! Patients with sleep apnea can also develop difficulty initiating and maintaining sleep (aka insomnia).   Having sleep apnea may increase your risk for other sleep disorders, including involuntary behaviors sleep such as sleep terrors, sleep talking, sleepwalking.    Having sleep apnea can also increase your risk for restless leg syndrome and leg movements at night.   Please note that untreated obstructive sleep apnea may carry additional perioperative morbidity. Patients with significant obstructive sleep apnea (typically, in the moderate to severe degree) should receive, if possible, perioperative PAP (positive airway pressure) therapy and the surgeons and particularly the anesthesiologists should be informed of the diagnosis and the severity of the sleep disordered breathing.   We will call you or email you through [place] with regards to your test results and plan a follow-up in sleep clinic accordingly. Most likely, you will hear from one of our nurses.   Our sleep lab administrative assistant will call you to schedule your sleep study and give you further instructions, regarding the check in process for the sleep study, arrival time, what to bring,  when you can expect to leave after the study, etc., and to answer any other logistical questions you may have. If you don't hear back from her by about 2 weeks from now, please feel free to call her direct line  at [phone] or you can call our general clinic number, or email [place] through My Chart.

## 2022-06-28 NOTE — Progress Notes (Signed)
Subjective:    Patient ID: [name] is a 66 y.o. male.  HPI    Star Age, MD, PhD [facility] [address] [place], [place] [address]  Dear Dr. Elease Hashimoto,   I saw your patient, Paul Morrison, upon your kind request in my sleep clinic today for initial consultation of his sleep disorder, in particular, difficulty maintaining sleep and restless leg symptoms.  The patient is accompanied by his wife today.  As you know, Paul Morrison is a 66 year old gentleman with an underlying medical history of arthritis, skin cancer, hypertension, hyperlipidemia, low testosterone, anxiety, low back pain and mild obesity, who reports that restless leg symptoms are not his primary concern, he has chronic difficulty falling asleep.  He reports difficulty with his sleep since September 2022.  He had back surgery.  He does report that he was on high-dose pain medication before and after his back surgery, this includes taking generic Dilaudid and also other pain medications.  He was also on Cymbalta and gabapentin before his surgery but had significant side effects of these medications.  He was started on pramipexole for restless leg symptoms in July 2023 and gradually increased it to 3 pills each night.  He has not had any major side effects from the medication but there was a concern at 1 point that he had visual side effects.  He saw a specialist ophthalmologist who advised him that Mirapex is not likely to cause him issues with his cornea.  He feels that Mirapex helps him fall asleep, he has tapered down a couple of months ago but then restarted it.  He does not take it daily, he takes 1 or 2 pills at night as needed.  He does not have any overt family history of restless leg syndrome.  He snores.  He has vivid dreams.  He has had dream enactment behavior.  This has been going on for years.  He has a history of anxiety and sees a psychiatrist.  He also has therapy.  He  is on Xanax as needed for anxiety, he used to take clonazepam as well.  His wife reports that his snoring improved.  He has a history of bruxism and uses a nightguard.  He has tried trazodone for sleep but is currently not on it.  He has nocturia about 2 or 3 times per average night, does not have any recurrent nocturnal or morning headaches.  He has been drinking alcohol every night for the past few months, he drinks about 3-4 drinks of liquor every night.  He admits that it helps him fall asleep.  He drinks caffeine in the form of coffee, 2 large cups per day, estimated to be 24 ounce altogether.  He is a non-smoker. He had blood work through your office on 05/03/2022 and I reviewed the results: CBC with differential showed hemoglobin elevated borderline at 17.1, otherwise benign findings, lipid panel showed total cholesterol 197, triglycerides mildly elevated at 209, HDL 46.3, CMP showed sodium of 139, potassium 4.1, glucose 101, BUN 22, creatinine 1.18, alk phos 45, AST 30, ALT 46.  PSA was normal at 1.53.  He has not had a sleep study. He reports to residual discomfort in his lower back. He does not believe he has sleep apnea and sleep testing would be a waste of time in his opinion but he is agreeable to pursuing sleep testing at this time.  Bedtime is generally around 10 PM and rise time  7 AM.  Epworth sleepiness score is 7 out of 24, fatigue severity score is 39 out of 63.  He reports stress particularly at work.  He uses a Visual merchandiser and an app to track his sleep.  He feels that his best REM sleep is in the morning hours.  He also brings in a write up of the neurological events in the recent past within the past year, and I reviewed his printed write up.   His Past Medical History Is Significant For: Past Medical History:  Diagnosis Date   Arthritis    Cancer ([place])    skin cancer left ear- MOHS   Hyperlipidemia    Hypertension    Low back pain     His Past Surgical History Is Significant  For: Past Surgical History:  Procedure Laterality Date   COLONOSCOPY     HERNIA REPAIR     INJECTION KNEE     both knees   keratatomy     knee arthroscopy     LUMBAR FUSION     MOHS SURGERY     POLYPECTOMY     TOTAL KNEE ARTHROPLASTY Right 11/24/2016   Procedure: RIGHT TOTAL KNEE ARTHROPLASTY;  Surgeon: [name], MD;  Location: [facility];  Service: Orthopedics;  Laterality: Right;   VASECTOMY      His Family History Is Significant For: Family History  Problem Relation Age of Onset   Hyperlipidemia Mother    Hypertension Mother    Asthma Mother    Coronary artery disease Other    Aneurysm Other    Colon cancer Neg Hx    Colon polyps Neg Hx    Esophageal cancer Neg Hx    Rectal cancer Neg Hx    Stomach cancer Neg Hx     His Social History Is Significant For: Social History   Socioeconomic History   Marital status: Married    Spouse name: Not on file   Number of children: Not on file   Years of education: Not on file   Highest education level: Some college, no degree  Occupational History   Not on file  Tobacco Use   Smoking status: Former   Smokeless tobacco: Never  Scientific laboratory technician Use: Never used  Substance and Sexual Activity   Alcohol use: Yes    Comment: Occas   Drug use: No   Sexual activity: Yes  Other Topics Concern   Not on file  Social History Narrative   Caffiene 2 cups in am   Work; 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   Social Determinants of Health   Financial Resource Strain: Low Risk  (10/27/2021)   Overall Financial Resource Strain (CARDIA)    Difficulty of Paying Living Expenses: Not hard at all  Food Insecurity: No Food Insecurity (10/27/2021)   Hunger Vital Sign    Worried About Running Out of Food in the Last Year: Never true    Ran Out of Food in the Last Year: Never true  Transportation Needs: No Transportation Needs (10/27/2021)   PRAPARE - Hydrologist (Medical): No     Lack of Transportation (Non-Medical): No  Physical Activity: Sufficiently Active (10/27/2021)   Exercise Vital Sign    Days of Exercise per Week: 7 days    Minutes of Exercise per Session: 120 min  Stress: Stress Concern Present (10/27/2021)   [place]    Feeling of  Stress : To some extent  Social Connections: Moderately Isolated (10/27/2021)   Social Connection and Isolation Panel [NHANES]    Frequency of Communication with Friends and Family: Three times a week    Frequency of Social Gatherings with Friends and Family: Once a week    Attends Religious Services: Never    Marine scientist or Organizations: No    Attends Music therapist: Not on file    Marital Status: Married    His Allergies Are:  Allergies  Allergen Reactions   Cephalexin Other (See Comments)    Stomach cramping    Celecoxib Rash  :   His Current Medications Are:  Outpatient Encounter Medications as of 06/28/2022  Medication Sig   allopurinol (ZYLOPRIM) 300 MG tablet Take 1 tablet (300 mg total) by mouth daily.   ALPRAZolam (XANAX) 0.5 MG tablet Take 1 tablet (0.5 mg total) by mouth at bedtime as needed for anxiety.   atorvastatin (LIPITOR) 40 MG tablet TAKE ONE TABLET DAILY   Glucosamine-Chondroitin 500-400 MG CAPS 1,200 mg.   indomethacin (INDOCIN) 50 MG capsule TAKE ONE (1) CAPSULE THREE (3) TIMES EACH DAY AS NEEDED   losartan (COZAAR) 100 MG tablet TAKE ONE TABLET DAILY   pramipexole (MIRAPEX) 0.125 MG tablet Take 3 tablets by mouth at bedtime.   tadalafil (CIALIS) 20 MG tablet Take one tablet by mouth every other day as needed for erectile dysfunction   [DISCONTINUED] Phenylephrine-guaiFENesin (GENEXA LA PO)    colchicine 0.6 MG tablet TAKE ONE TABLET TWICE DAILY AS NEEDED FOR GOUT FLARE (Patient not taking: Reported on 06/28/2022)   Testosterone 1.62 % GEL APPLY 2 PUMP SPRAYS PER ARM ONCE DAILY EARLY EACH MORNING  (Patient not taking: Reported on 06/28/2022)   [DISCONTINUED] pramipexole (MIRAPEX) 0.125 MG tablet Take 1 to 2 tablets by mouth each night as needed for restless leg syndrome (Patient not taking: Reported on 06/28/2022)   Facility-Administered Encounter Medications as of 06/28/2022  Medication   0.9 %  sodium chloride infusion  :   Review of Systems:  Out of a complete 14 point review of systems, all are reviewed and negative with the exception of these symptoms as listed below:   Review of Systems  Neurological:        Having insomnia, and RLS, had 06/2021 sleeping problem after surgery (back).  Started on trazadone 150 po qhs worked. (Waking up emotional dreams then RL started). Mirapex per pcp and helped.  ESS 7, FSS 39.  No sleep study.      Objective:  Neurological Exam  Physical Exam Physical Examination:   Vitals:   06/28/22 1252  BP: (!) 140/91  Pulse: 86    General Examination: The patient is a very pleasant 66 y.o. male in no acute distress. He appears well-developed and well-nourished and well groomed.   HEENT: Normocephalic, atraumatic, pupils are equal, round and reactive to light, extraocular tracking is good without limitation to gaze excursion or nystagmus noted. Hearing is grossly intact. Face is symmetric with normal facial animation. Speech is clear with no dysarthria noted. There is no hypophonia. There is no lip, neck/head, jaw or voice tremor. Neck is supple with full range of passive and active motion. There are no carotid bruits on auscultation. Oropharynx exam reveals: mild mouth dryness, adequate dental hygiene and moderate airway crowding secondary to redundant soft palate, tongue.  Tonsils absent.  Neck circumference 17 1/8 inches.  Tongue protrudes centrally and palate elevates symmetrically.   Chest: Clear to  auscultation without wheezing, rhonchi or crackles noted.  Heart: S1+S2+0, regular and normal without murmurs, rubs or gallops noted.    Abdomen: Soft, non-tender and non-distended.  Extremities: There is no pitting edema in the distal lower extremities bilaterally.   Skin: Warm and dry without trophic changes noted.   Musculoskeletal: exam reveals no obvious joint deformities.   Neurologically:  Mental status: The patient is awake, alert and oriented in all 4 spheres. His immediate and remote memory, attention, language skills and fund of knowledge are appropriate. There is no evidence of aphasia, agnosia, apraxia or anomia. Speech is clear with normal prosody and enunciation. Thought process is linear. Mood is normal and affect is normal.  Cranial nerves II - XII are as described above under HEENT exam.  Motor exam: Normal bulk, strength and tone is noted. There is no obvious action or resting tremor.  Fine motor skills and coordination: grossly intact.  Cerebellar testing: No dysmetria or intention tremor. There is no truncal or gait ataxia.  Sensory exam: intact to light touch in the upper and lower extremities.  Gait, station and balance: He stands easily. No veering to one side is noted. No leaning to one side is noted. Posture is age-appropriate and stance is narrow based. Gait shows normal stride length and normal pace. No problems turning are noted.   Assessment and Plan:  In summary, Paul Morrison is a 66 year old gentleman with an underlying medical history of arthritis, skin cancer, hypertension, hyperlipidemia, low testosterone, anxiety, low back pain and mild obesity, who presents for evaluation of his sleep disturbance.  His most pressing issue is difficulty falling asleep.  He has had restless leg symptoms but these improved after taking pramipexole.  He is encouraged to take pramipexole as prescribed.  It is possible that residual back pain may also affect his legs.  He may be at risk for underlying obstructive sleep apnea.  He reports parasomnias such as dream enactment behavior and vivid dreams.  He is  advised to continue follow-up closely with his psychiatrist and with his therapist.  He can try melatonin at night for sleep, he can start at 3 mg and increase it to 5 mg and up to 10 mg if need be.  He is advised to take it about an hour or 2 before bedtime.  I would like to proceed with a sleep study to rule out an underlying organic cause of his symptoms.  I also suggested we proceed with blood work to check his thyroid function, B12 level and his iron studies.  We will call him with his blood test results.  If he has obstructive sleep apnea, he is advised that I will likely recommend trial of AutoPap or CPAP therapy. He indicated that he would be reluctant to try positive airway pressure treatment but not opposed to it completely.   We will pick up our discussion about the next steps and treatment options after testing.  We will keep him posted as to the test results by phone call and/or MyChart messaging where possible.  We will plan to follow-up in sleep clinic accordingly as well.  I answered all their questions today and the patient and his wife were in agreement.   I encouraged them to call with any interim questions, concerns, problems or updates or email [place] through [place].  Generally speaking, sleep test authorizations may take up to 2 weeks, sometimes less, sometimes longer, the patient is encouraged to get in touch with [place] if they  do not hear back from the sleep lab staff directly within the next 2 weeks.  Thank you very much for allowing me to participate in the care of Paul Morrison. If I can be of any further assistance to you please do not hesitate to call me at [phone].  Sincerely,   Star Age, MD, PhD

## 2022-06-29 ENCOUNTER — Encounter: Payer: Self-pay | Admitting: Family Medicine

## 2022-06-29 ENCOUNTER — Encounter: Payer: Self-pay | Admitting: Neurology

## 2022-06-29 DIAGNOSIS — Z1211 Encounter for screening for malignant neoplasm of colon: Secondary | ICD-10-CM

## 2022-06-29 LAB — IRON AND TIBC
Iron Saturation: 37 % (ref 15–55)
Iron: 139 ug/dL (ref 38–169)
Total Iron Binding Capacity: 376 ug/dL (ref 250–450)
UIBC: 237 ug/dL (ref 111–343)

## 2022-06-29 LAB — B12 AND FOLATE PANEL
Folate: 14.8 ng/mL (ref >3.0)
Vitamin B-12: 669 pg/mL (ref 232–1245)

## 2022-06-29 LAB — FERRITIN: Ferritin: 381 ng/mL (ref 30–400)

## 2022-06-29 LAB — TSH: TSH: 2.45 u[IU]/mL (ref 0.450–4.500)

## 2022-06-29 NOTE — Addendum Note (Signed)
Addended by: Star Age on: 06/29/2022 07:48 AM   Modules accepted: Orders

## 2022-06-30 MED ORDER — ALPRAZOLAM 0.5 MG PO TABS: 0.5000 mg | ORAL_TABLET | Freq: Every evening | ORAL | 0 refills | Status: DC | PRN

## 2022-06-30 NOTE — Telephone Encounter (Signed)
Sent in 1 refill of alprazolam  Eulas Post MD [facility] at [facility]

## 2022-07-10 ENCOUNTER — Telehealth: Payer: Self-pay | Admitting: Neurology

## 2022-07-10 NOTE — Telephone Encounter (Signed)
HST- Medicare no auth req/BCBS auth: [id number] (exp. 07/10/22 to 09/07/22)   Patient is scheduled at [facility] for 07/11/22 at 9:30 AM.  I spoke with the patient and informed him that his insurance approved the HST not the in lab. He then stated to me that is not what Dr. Rexene Alberts wanted him to have. That she wanted him to have the in lab. I again informed him that unfortunately it met the criteria for a HST with his insurance.   He then proceed to tell me that is not true that is not how the insurance works. I informed him that I could send a message to Dr. Rexene Alberts and the nurse and they could get back to him about this.Marland Kitchen He then proceed to say since we are already at this point to go ahead and schedule the HST. I told him that I actually have a cancellation for tomorrow 07/11/22 at 9:30 AM. He stated he will take that and he actually now prefers this.

## 2022-07-11 ENCOUNTER — Ambulatory Visit (INDEPENDENT_AMBULATORY_CARE_PROVIDER_SITE_OTHER): Payer: BC Managed Care – PPO | Admitting: Neurology

## 2022-07-11 DIAGNOSIS — G2581 Restless legs syndrome: Secondary | ICD-10-CM

## 2022-07-11 DIAGNOSIS — R0683 Snoring: Secondary | ICD-10-CM

## 2022-07-11 DIAGNOSIS — E66811 Obesity, class 1: Secondary | ICD-10-CM

## 2022-07-11 DIAGNOSIS — G4733 Obstructive sleep apnea (adult) (pediatric): Secondary | ICD-10-CM

## 2022-07-11 DIAGNOSIS — E669 Obesity, unspecified: Secondary | ICD-10-CM

## 2022-07-11 DIAGNOSIS — Z9189 Other specified personal risk factors, not elsewhere classified: Secondary | ICD-10-CM

## 2022-07-11 DIAGNOSIS — G4700 Insomnia, unspecified: Secondary | ICD-10-CM

## 2022-07-13 ENCOUNTER — Other Ambulatory Visit: Payer: Self-pay | Admitting: Family Medicine

## 2022-07-13 ENCOUNTER — Encounter: Payer: Self-pay | Admitting: Family Medicine

## 2022-07-13 DIAGNOSIS — D751 Secondary polycythemia: Secondary | ICD-10-CM

## 2022-07-13 NOTE — Progress Notes (Signed)
Future order placed for CBC.  Hopefully, I ordered correctly for him to get at [facility] clinic.  Let pt know this has been ordered.   Eulas Post MD [facility]

## 2022-07-13 NOTE — Progress Notes (Unsigned)
   [facility]  HOME SLEEP TEST (Watch PAT) REPORT  STUDY DATE: 07/11/22  DOB: 1956/01/17  MRN: [id number]  ORDERING CLINICIAN: Star Age, MD, PhD   REFERRING CLINICIAN: Eulas Post, MD   CLINICAL INFORMATION/HISTORY:  66 year old gentleman with an underlying medical history of arthritis, skin cancer, hypertension, hyperlipidemia, low testosterone, anxiety, low back pain and mild obesity, who reports that restless leg symptoms are not his primary concern, he has chronic difficulty falling asleep.  He reports difficulty with his sleep since September 2022.   Epworth sleepiness score: 7/24.  BMI: 32.4 kg/m  FINDINGS:   Sleep Summary:   Total Recording Time (hours, min): 9 hours, 32 min  Total Sleep Time (hours, min):  8 hours, 5 min  Percent REM (%):    30.3%   Respiratory Indices:   Calculated pAHI (per hour):  14.3/hour         REM pAHI:    25.3/hour       NREM pAHI: 9.6/hour  Central pAHI: 0.6/hour  Oxygen Saturation Statistics:    Oxygen Saturation (%) Mean: 94%   Minimum oxygen saturation (%):                 83%   O2 Saturation Range (%): 83 - 99%    O2 Saturation (minutes) <=88%: 0.2 min  Pulse Rate Statistics:   Pulse Mean (bpm):    61/min    Pulse Range (45 - 99/min)   IMPRESSION: OSA (obstructive sleep apnea)   RECOMMENDATION:  ***   INTERPRETING PHYSICIAN:   Star Age, MD, PhD Medical Director, [place] [facility]) Diplomat, ABPN (Neurology and Sleep)   [facility] [address] [place], [place] [address] [phone]

## 2022-07-14 ENCOUNTER — Other Ambulatory Visit: Payer: Self-pay

## 2022-07-14 MED ORDER — LOSARTAN POTASSIUM 100 MG PO TABS: 100.0000 mg | ORAL_TABLET | Freq: Every day | ORAL | 0 refills | Status: DC

## 2022-07-14 NOTE — Addendum Note (Signed)
Addended by: Rosalee Kaufman L on: 07/14/2022 11:15 AM   Modules accepted: Orders

## 2022-07-14 NOTE — Progress Notes (Signed)
Patient informed of the message and verbalized understanding. Patient inquired if he could have magnesium labs added to blood draw?

## 2022-07-14 NOTE — Progress Notes (Signed)
Labs added.

## 2022-07-14 NOTE — Addendum Note (Signed)
Addended by: Nilda Riggs on: 07/14/2022 01:50 PM   Modules accepted: Orders

## 2022-07-17 ENCOUNTER — Other Ambulatory Visit (INDEPENDENT_AMBULATORY_CARE_PROVIDER_SITE_OTHER): Payer: BC Managed Care – PPO

## 2022-07-17 DIAGNOSIS — D751 Secondary polycythemia: Secondary | ICD-10-CM

## 2022-07-17 LAB — CBC WITH DIFFERENTIAL/PLATELET
Basophils Absolute: 0 10*3/uL (ref 0.0–0.1)
Basophils Relative: 0.6 % (ref 0.0–3.0)
Eosinophils Absolute: 0.2 10*3/uL (ref 0.0–0.7)
Eosinophils Relative: 2.6 % (ref 0.0–5.0)
HCT: 46.6 % (ref 39.0–52.0)
Hemoglobin: 15.8 g/dL (ref 13.0–17.0)
Lymphocytes Relative: 22.5 % (ref 12.0–46.0)
Lymphs Abs: 1.4 10*3/uL (ref 0.7–4.0)
MCHC: 33.9 g/dL (ref 30.0–36.0)
MCV: 94.3 fl (ref 78.0–100.0)
Monocytes Absolute: 0.5 10*3/uL (ref 0.1–1.0)
Monocytes Relative: 8.1 % (ref 3.0–12.0)
Neutro Abs: 4 10*3/uL (ref 1.4–7.7)
Neutrophils Relative %: 66.2 % (ref 43.0–77.0)
Platelets: 185 10*3/uL (ref 150.0–400.0)
RBC: 4.94 Mil/uL (ref 4.22–5.81)
RDW: 13.7 % (ref 11.5–15.5)
WBC: 6.1 10*3/uL (ref 4.0–10.5)

## 2022-07-17 LAB — MAGNESIUM: Magnesium: 2.1 mg/dL (ref 1.5–2.5)

## 2022-07-17 NOTE — Addendum Note (Signed)
Addended by: Star Age on: 07/17/2022 04:18 PM   Modules accepted: Orders

## 2022-07-17 NOTE — Addendum Note (Signed)
Addended by: Nilda Riggs on: 07/17/2022 02:52 PM   Modules accepted: Orders

## 2022-07-17 NOTE — Procedures (Signed)
[facility]  HOME SLEEP TEST (Watch PAT) REPORT  STUDY DATE: 07/11/22  DOB: August 07, 1955  MRN: [id number]  ORDERING CLINICIAN: Star Age, MD, PhD   REFERRING CLINICIAN: Eulas Post, MD   CLINICAL INFORMATION/HISTORY:  66 year old gentleman with an underlying medical history of arthritis, skin cancer, hypertension, hyperlipidemia, low testosterone, anxiety, low back pain and mild obesity, who reports that restless leg symptoms are not his primary concern, he has chronic difficulty falling asleep.  He reports difficulty with his sleep since September 2022.   Epworth sleepiness score: 7/24.  BMI: 32.4 kg/m  FINDINGS:   Sleep Summary:   Total Recording Time (hours, min): 9 hours, 32 min  Total Sleep Time (hours, min):  8 hours, 5 min  Percent REM (%):    30.3%   Respiratory Indices:   Calculated pAHI (per hour):  14.3/hour         REM pAHI:    25.3/hour       NREM pAHI: 9.6/hour  Central pAHI: 0.6/hour  Oxygen Saturation Statistics:    Oxygen Saturation (%) Mean: 94%   Minimum oxygen saturation (%):                 83%   O2 Saturation Range (%): 83 - 99%    O2 Saturation (minutes) <=88%: 0.2 min  Pulse Rate Statistics:   Pulse Mean (bpm):    61/min    Pulse Range (45 - 99/min)   IMPRESSION: OSA (obstructive sleep apnea)   RECOMMENDATION:  This home sleep test demonstrates overall mild obstructive sleep apnea with a total AHI of 14.3/hour and O2 nadir of 83%. Snoring was detected, ranging from mild to louder. Given the patient's medical history and sleep related complaints, therapy with a  positive airway pressure device is a reasonable first-line choice and clinically recommended. Treatment can be achieved in the form of autoPAP trial/titration at home for now. A full night, in-lab PAP titration study may aid in improving proper treatment settings and with mask fit, if needed, down the road. Alternative treatments may include weight  loss (where appropriate) along with avoidance of the supine sleep position (if possible), or an oral appliance in appropriate candidates.   Please note that untreated obstructive sleep apnea may carry additional perioperative morbidity. Patients with significant obstructive sleep apnea should receive perioperative PAP therapy and the surgeons and particularly the anesthesiologist should be informed of the diagnosis and the severity of the sleep disordered breathing. The patient should be cautioned not to drive, work at heights, or operate dangerous or heavy equipment when tired or sleepy. Review and reiteration of good sleep hygiene measures should be pursued with any patient. Other causes of the patient's symptoms, including circadian rhythm disturbances, an underlying mood disorder, medication effect and/or an underlying medical problem cannot be ruled out based on this test. Clinical correlation is recommended.  The patient and his referring provider will be notified of the test results. The patient will be seen in follow up in sleep clinic at [facility], as necessary.  I certify that I have reviewed the raw data recording prior to the issuance of this report in accordance with the standards of the American Academy of Sleep Medicine (AASM).  INTERPRETING PHYSICIAN:   Star Age, MD, PhD Medical Director, [place] [facility]) [place], ABPN (Neurology and Sleep)   [facility] [address] [place], [place] [address] [phone]

## 2022-07-18 ENCOUNTER — Encounter: Payer: Self-pay | Admitting: Neurology

## 2022-07-18 ENCOUNTER — Telehealth: Payer: Self-pay | Admitting: *Deleted

## 2022-07-18 NOTE — Telephone Encounter (Signed)
thanks

## 2022-07-18 NOTE — Telephone Encounter (Signed)
The DME company will call him separate to schedule. We are asking to schedule his follow-up appointment now to have something on the books ahead of time.

## 2022-07-18 NOTE — Telephone Encounter (Signed)
Call patient to schedule an initial auto-pap follow-up appointment approximately 80 days from today's date.

## 2022-07-18 NOTE — Telephone Encounter (Signed)
Pt has been called, he has been scheduled for 2-28 with 9:15 check in, pt told to bring autopap and power cord, this is Pharmacist, hospital for [place], [employer]

## 2022-07-18 NOTE — Telephone Encounter (Signed)
Autopap referral faxed to [place].  Received a receipt of confirmation. Message sent to phone room to schedule initial autopap f/u.

## 2022-07-18 NOTE — Telephone Encounter (Signed)
Star Age, MD 07/17/2022  4:18 PM EST Back to Top    Patient referred by PCP, seen by me on 06/28/22, pt had HST on 07/11/22.     Please call and notify the patient that the recent home sleep test showed obstructive sleep apnea. OSA is overall mild, but worth treating to see if he feels better after treatment and if he sleeps better. To that end I recommend treatment for this in the form of autoPAP, which means, that we don't have to bring him in for a sleep study with CPAP, but will let him try an autoPAP machine at home, through a DME company (of his choice, or as per insurance requirement). The DME representative will educate him on how to use the machine, how to put the mask on, etc. I have placed an order in the chart. Please send referral, talk to patient, send report to referring MD. We will need a FU in sleep clinic for 10 weeks post-PAP set up, please arrange that with me or one of our NPs. Thanks,   Star Age, MD, PhD [facility])

## 2022-07-18 NOTE — Telephone Encounter (Signed)
Phone rep called pt to schedule, pt informed that he has not heard from anyone re: when he will scheduled to pick up and start using his Auto pap

## 2022-08-10 ENCOUNTER — Encounter: Payer: Self-pay | Admitting: *Deleted

## 2022-08-10 ENCOUNTER — Telehealth: Payer: Self-pay | Admitting: *Deleted

## 2022-08-10 NOTE — Patient Outreach (Signed)
  Care Coordination   Initial Visit Note   08/10/2022 Name: Paul Morrison MRN: [id number] DOB: 07-06-1956  Paul Morrison is a 67 y.o. year old male who sees Burchette, Alinda Sierras, MD for primary care. I spoke with  Paul Morrison by phone today.  What matters to the patients health and wellness today?  Sleep apnea/anxiety    Goals Addressed               This Visit's Progress     Sleep Apnea/Anxiety (pt-stated)        Care Coordination Interventions: Reviewed medications with patient and discussed adherence with no needed refills Reviewed scheduled/upcoming provider appointments including sufficient transportation source Screening for signs and symptoms of depression related to chronic disease state  Assessed social determinant of health barriers Educated on care management services related to nurse care manager, Education officer, museum and pharmacy.  Also educated on sleep apnea and DME-durable medical equipment from medical supply store or Amazon-cost effective to assist with his sleep habits (pillows and sleep positioning). Pt complained of mask not fitting correctly. Encouraged pt to contact the agency and speak with the respiratory therapist to request a larger size mask or a different mask to improve his sleep time. Pt also inquired on anxiety medications. Encouraged pt to discussed with his providers (PCP, neurologist or his psychiatrist). All providers are involved with his care and aware of his anxiety pt just needs ongoing medications when needed (Xanax or something similar). Also discuss relaxation techniques to assist with his sleep habits and anxiety levels such as exercising, avoid spicy foods and heavy meals prior to bedtime, the right lighting or no lights at bedtime. OTC medications were discussed as pt verified they are limited results with Melatonin, Magnesium ect.. No other inquires or care management needs at this time as RN will add educational information to AVS for pt  viewing.           SDOH assessments and interventions completed:  Yes  SDOH Interventions Today    Flowsheet Row Most Recent Value  SDOH Interventions   Food Insecurity Interventions Intervention Not Indicated  Housing Interventions Intervention Not Indicated  Transportation Interventions Intervention Not Indicated  Utilities Interventions Intervention Not Indicated        Care Coordination Interventions:  Yes, provided   Follow up plan: No further intervention required.   Encounter Outcome:  Pt. Visit Completed   Raina Mina, RN Care Management Coordinator [place] Office [phone]

## 2022-08-10 NOTE — Patient Instructions (Signed)
Visit Information  Thank you for taking time to visit with me today. Please don't hesitate to contact me if I can be of assistance to you.   Following are the goals we discussed today:   Goals Addressed               This Visit's Progress     Sleep Apnea/Anxiety (pt-stated)        Care Coordination Interventions: Reviewed medications with patient and discussed adherence with no needed refills Reviewed scheduled/upcoming provider appointments including sufficient transportation source Screening for signs and symptoms of depression related to chronic disease state  Assessed social determinant of health barriers Educated on care management services related to nurse care manager, Education officer, museum and pharmacy.  Also educated on sleep apnea and DME-durable medical equipment from medical supply store or Amazon-cost effective to assist with his sleep habits (pillows and sleep positioning). Pt complained of mask not fitting correctly. Encouraged pt to contact the agency and speak with the respiratory therapist to request a larger size mask or a different mask to improve his sleep time. Pt also inquired on anxiety medications. Encouraged pt to discussed with his providers (PCP, neurologist or his psychiatrist). All providers are involved with his care and aware of his anxiety pt just needs ongoing medications when needed (Xanax or something similar). Also discuss relaxation techniques to assist with his sleep habits and anxiety levels such as exercising, avoid spicy foods and heavy meals prior to bedtime, the right lighting or no lights at bedtime. OTC medications were discussed as pt verified they are limited results with Melatonin, Magnesium ect.. No other inquires or care management needs at this time as RN will add educational information to AVS for pt viewing.           Please call the care guide team at [phone] if you need to cancel or reschedule your appointment.   If you are  experiencing a Mental Health or [place] or need someone to talk to, please call the Suicide and Crisis Lifeline: 988  Patient verbalizes understanding of instructions and care plan provided today and agrees to view in [place]. Active MyChart status and patient understanding of how to access instructions and care plan via MyChart confirmed with patient.     No further follow up required: No needs  Raina Mina, RN Care Management Coordinator [place] Office [phone]

## 2022-08-16 ENCOUNTER — Encounter: Payer: Self-pay | Admitting: Family Medicine

## 2022-08-17 NOTE — Telephone Encounter (Signed)
Noted  

## 2022-08-22 ENCOUNTER — Ambulatory Visit (INDEPENDENT_AMBULATORY_CARE_PROVIDER_SITE_OTHER): Payer: BC Managed Care – PPO | Admitting: Family Medicine

## 2022-08-22 VITALS — BP 110/60 | HR 90 | Temp 97.7°F | Ht 75.0 in | Wt 266.2 lb

## 2022-08-22 DIAGNOSIS — E669 Obesity, unspecified: Secondary | ICD-10-CM | POA: Diagnosis not present

## 2022-08-22 DIAGNOSIS — E782 Mixed hyperlipidemia: Secondary | ICD-10-CM

## 2022-08-22 DIAGNOSIS — U099 Post covid-19 condition, unspecified: Secondary | ICD-10-CM

## 2022-08-22 DIAGNOSIS — R5383 Other fatigue: Secondary | ICD-10-CM | POA: Diagnosis not present

## 2022-08-22 DIAGNOSIS — E66811 Obesity, class 1: Secondary | ICD-10-CM

## 2022-08-22 LAB — POCT GLYCOSYLATED HEMOGLOBIN (HGB A1C): Hemoglobin A1C: 5.5 % (ref 4.0–5.6)

## 2022-08-22 MED ORDER — WEGOVY 0.25 MG/0.5ML ~~LOC~~ SOAJ: 0.2500 mg | SUBCUTANEOUS | 2 refills | Status: DC

## 2022-08-22 NOTE — Progress Notes (Signed)
Established Patient Office Visit  Subjective   Patient ID: Paul Morrison, male    DOB: [birth year]  Age: 67 y.o. MRN: [id number]  Chief Complaint  Patient presents with   Fatigue   GI Problem   Mental fog   Sleeping Problem    HPI   Paul Morrison came in initially to discuss possible "long COVID" symptoms.  He states he was diagnosed with COVID the day after Christmas on the 26.  He had nasal congestion, diarrhea, fatigue, and "brain fog ".  No persistent fever or cough.  No chest pains.  No significant dyspnea.  He states that last week right after making this appointment his symptoms above have dramatically improved.  In fact, he went to the gym today and was able to do elliptical for about an hour followed by weights for an hour.  He hopes to build up his stamina again soon.  Has not been exercising much at all over the past month.  He is concerned about his weight.  His BMI is up over 34.  He has multiple comorbidities including hypertension, obstructive sleep apnea, GERD, primary osteoarthritis involving multiple joints, gout, and hyperlipidemia.  He had some prediabetes range blood sugars in the past.  No recent A1c.  Weight is currently up to 266 pounds from 250 pounds last office visit here.  He specifically would like to discuss possible weight loss medications.  He does like he does some impulse/emotional eating.  He has struggled with exercise and home diets alone.  Past Medical History:  Diagnosis Date   Arthritis    Cancer ([place])    skin cancer left ear- MOHS   Hyperlipidemia    Hypertension    Low back pain    Past Surgical History:  Procedure Laterality Date   COLONOSCOPY     HERNIA REPAIR     INJECTION KNEE     both knees   keratatomy     knee arthroscopy     LUMBAR FUSION     MOHS SURGERY     POLYPECTOMY     TOTAL KNEE ARTHROPLASTY Right 11/24/2016   Procedure: RIGHT TOTAL KNEE ARTHROPLASTY;  Surgeon: [name], MD;  Location: [facility];  Service: Orthopedics;   Laterality: Right;   VASECTOMY      reports that he has quit smoking. He has never used smokeless tobacco. He reports current alcohol use. He reports that he does not use drugs. family history includes Aneurysm in an other family member; Asthma in his mother; Coronary artery disease in an other family member; Hyperlipidemia in his mother; Hypertension in his mother. Allergies  Allergen Reactions   Cephalexin Other (See Comments)    Stomach cramping    Celecoxib Rash    Review of Systems  Constitutional:  Negative for malaise/fatigue.  Eyes:  Negative for blurred vision.  Respiratory:  Negative for shortness of breath.   Cardiovascular:  Negative for chest pain.  Gastrointestinal:  Negative for abdominal pain.  Neurological:  Negative for dizziness, weakness and headaches.      Objective:     BP 110/60 (BP Location: Left Arm, Patient Position: Sitting, Cuff Size: Large)   Pulse 90   Temp 97.7 F (36.5 C) (Oral)   Ht '6\' 3"'$  (1.905 m)   Wt 266 lb 3.2 oz (120.7 kg)   SpO2 96%   BMI 33.27 kg/m    Physical Exam Vitals reviewed.  Constitutional:      General: He is not in acute distress.  Appearance: He is well-developed.  HENT:     Right Ear: External ear normal.     Left Ear: External ear normal.  Eyes:     Pupils: Pupils are equal, round, and reactive to light.  Neck:     Thyroid: No thyromegaly.  Cardiovascular:     Rate and Rhythm: Normal rate and regular rhythm.  Pulmonary:     Effort: Pulmonary effort is normal. No respiratory distress.     Breath sounds: Normal breath sounds. No wheezing or rales.  Musculoskeletal:     Cervical back: Neck supple.     Right lower leg: No edema.     Left lower leg: No edema.  Neurological:     Mental Status: He is alert and oriented to person, place, and time.      Results for orders placed or performed in visit on 08/22/22  POCT glycosylated hemoglobin (Hb A1C)  Result Value Ref Range   Hemoglobin A1C 5.5 4.0 - 5.6 %    HbA1c POC (<> result, manual entry)     HbA1c, POC (prediabetic range)     HbA1c, POC (controlled diabetic range)        The 10-year ASCVD risk score (Arnett DK, et al., 2019) is: 11.8%    Assessment & Plan:   #1 recent COVID infection.  Patient had about a month of pervasive fatigue and some intermittent diarrhea and brain fog but he seemed to be clearing at this time.  He recently resumed some exercise as above.  Continue to progressively increase activities as tolerated  #2 obesity with BMI over 33 with multiple comorbidities including hypertension, obstructive sleep apnea, GERD, osteoarthritis involving multiple joints, gout, hyperlipidemia.  At risk for diabetes.  A1c today 5.5%. -We had a long discussion regarding weight loss medications.  He had specific interest in GLP-1 medications.  No history of pancreatitis.  No family history of thyroid cancer. -We discussed possible use of Wegovy starting 0.25 mg subcutaneous once weekly.  Reviewed potential side effects such as nausea.  We discussed availability issues and also that coverage may be difficult.  He does have multiple comorbidities as above.  If we can get this filled we will plan on titration in a month if tolerating well to 0.5 mg subcutaneous weekly and then plan in office follow-up in a few months to reassess  [name], MD

## 2022-08-23 ENCOUNTER — Encounter: Payer: Self-pay | Admitting: Family Medicine

## 2022-09-01 ENCOUNTER — Ambulatory Visit (AMBULATORY_SURGERY_CENTER): Payer: BC Managed Care – PPO | Admitting: *Deleted

## 2022-09-01 VITALS — Ht 75.0 in | Wt 250.0 lb

## 2022-09-01 DIAGNOSIS — Z86010 Personal history of colonic polyps: Secondary | ICD-10-CM

## 2022-09-01 MED ORDER — NA SULFATE-K SULFATE-MG SULF 17.5-3.13-1.6 GM/177ML PO SOLN: 1.0000 | Freq: Once | ORAL | 0 refills | Status: AC

## 2022-09-01 NOTE — Progress Notes (Signed)
No egg or soy allergy known to patient  No issues known to pt with past sedation with any surgeries or procedures Patient denies ever being told they had issues or difficulty with intubation  No FH of Malignant Hyperthermia Pt is not on diet pills Pt is not on  home 02  Pt is not on blood thinners  Pt denies issues with constipation  Pt is not on dialysis Pt denies any upcoming cardiac testing Pt encouraged to use to use Singlecare or Goodrx to reduce cost  Patient's chart reviewed by Paul Morrison CNRA prior to previsit and patient appropriate for the [facility].  Previsit completed and red dot placed by patient's name on their procedure day (on provider's schedule).  . Visit by phone Instructions reviewed with pt and pt states understanding. Instructed to review again prior to procedure. Pt states they will.  

## 2022-09-11 ENCOUNTER — Encounter: Payer: Self-pay | Admitting: Gastroenterology

## 2022-09-20 ENCOUNTER — Other Ambulatory Visit: Payer: Self-pay

## 2022-09-20 ENCOUNTER — Encounter: Payer: Self-pay | Admitting: Gastroenterology

## 2022-09-20 DIAGNOSIS — Z86010 Personal history of colonic polyps: Secondary | ICD-10-CM

## 2022-09-20 MED ORDER — NA SULFATE-K SULFATE-MG SULF 17.5-3.13-1.6 GM/177ML PO SOLN: 1.0000 | Freq: Once | ORAL | 0 refills | Status: AC

## 2022-09-21 ENCOUNTER — Encounter: Payer: Self-pay | Admitting: Certified Registered Nurse Anesthetist

## 2022-09-22 ENCOUNTER — Encounter: Payer: Self-pay | Admitting: Gastroenterology

## 2022-09-22 ENCOUNTER — Ambulatory Visit (AMBULATORY_SURGERY_CENTER): Payer: BC Managed Care – PPO | Admitting: Gastroenterology

## 2022-09-22 VITALS — BP 124/80 | HR 71 | Temp 99.6°F | Resp 13 | Ht 75.0 in | Wt 250.0 lb

## 2022-09-22 DIAGNOSIS — Z09 Encounter for follow-up examination after completed treatment for conditions other than malignant neoplasm: Secondary | ICD-10-CM

## 2022-09-22 DIAGNOSIS — D125 Benign neoplasm of sigmoid colon: Secondary | ICD-10-CM

## 2022-09-22 DIAGNOSIS — K635 Polyp of colon: Secondary | ICD-10-CM

## 2022-09-22 DIAGNOSIS — Z86010 Personal history of colonic polyps: Secondary | ICD-10-CM | POA: Diagnosis not present

## 2022-09-22 DIAGNOSIS — D124 Benign neoplasm of descending colon: Secondary | ICD-10-CM | POA: Diagnosis not present

## 2022-09-22 DIAGNOSIS — K639 Disease of intestine, unspecified: Secondary | ICD-10-CM

## 2022-09-22 DIAGNOSIS — D122 Benign neoplasm of ascending colon: Secondary | ICD-10-CM

## 2022-09-22 MED ORDER — SODIUM CHLORIDE 0.9 % IV SOLN: 500.0000 mL | Freq: Once | INTRAVENOUS | Status: DC

## 2022-09-22 NOTE — Progress Notes (Signed)
History and Physical:  This patient presents for endoscopic testing for: Encounter Diagnosis  Name Primary?   History of colonic polyps Yes     TA (<73m) x 03 May 2018 Patient denies chronic abdominal pain, rectal bleeding, constipation or diarrhea.   Patient is otherwise without complaints or active issues today.   Past Medical History: Past Medical History:  Diagnosis Date   Arthritis    Cancer ([place]    skin cancer left ear- MOHS   Hyperlipidemia    Hypertension    Low back pain      Past Surgical History: Past Surgical History:  Procedure Laterality Date   COLONOSCOPY     HERNIA REPAIR     INJECTION KNEE     both knees   keratatomy     knee arthroscopy     LUMBAR FUSION     MOHS SURGERY     POLYPECTOMY     TOTAL KNEE ARTHROPLASTY Right 11/24/2016   Procedure: RIGHT TOTAL KNEE ARTHROPLASTY;  Surgeon: RSydnee Cabal MD;  Location: [facility];  Service: Orthopedics;  Laterality: Right;   VASECTOMY      Allergies: Allergies  Allergen Reactions   Cephalexin Other (See Comments)    Stomach cramping    Celecoxib Rash    Outpatient Meds: Current Outpatient Medications  Medication Sig Dispense Refill   allopurinol (ZYLOPRIM) 300 MG tablet Take 1 tablet (300 mg total) by mouth daily. 90 tablet 3   atorvastatin (LIPITOR) 40 MG tablet TAKE ONE TABLET DAILY 90 tablet 1   colchicine 0.6 MG tablet TAKE ONE TABLET TWICE DAILY AS NEEDED FOR GOUT FLARE 60 tablet 3   Glucosamine-Chondroitin 500-400 MG CAPS 1,200 mg.     losartan (COZAAR) 100 MG tablet Take 1 tablet (100 mg total) by mouth daily. 90 tablet 0   pramipexole (MIRAPEX) 0.125 MG tablet Take 3 tablets by mouth at bedtime. 90 tablet 11   Testosterone 1.62 % GEL APPLY 2 PUMP SPRAYS PER ARM ONCE DAILY EARLY EACH MORNING 150 g 2   ALPRAZolam (XANAX) 0.5 MG tablet Take 1 tablet (0.5 mg total) by mouth at bedtime as needed for anxiety. 30 tablet 0   indomethacin (INDOCIN) 50 MG capsule TAKE ONE (1) CAPSULE THREE (3)  TIMES EACH DAY AS NEEDED 30 capsule 0   Semaglutide-Weight Management (WEGOVY) 0.25 MG/0.5ML SOAJ Inject 0.25 mg into the skin once a week. 2 mL 2   tadalafil (CIALIS) 20 MG tablet Take one tablet by mouth every other day as needed for erectile dysfunction 10 tablet 5   Current Facility-Administered Medications  Medication Dose Route Frequency Provider Last Rate Last Admin   0.9 %  sodium chloride infusion  500 mL Intravenous Once [name], HEstill CottaIII, MD          ___________________________________________________________________ Objective   Exam:  BP 130/84   Pulse 76   Temp 99.6 F (37.6 C) (Skin)   Ht '6\' 3"'$  (1.905 m)   Wt 250 lb (113.4 kg)   SpO2 95%   BMI 31.25 kg/m   CV: regular , S1/S2 Resp: clear to auscultation bilaterally, normal RR and effort noted GI: soft, no tenderness, with active bowel sounds.   Assessment: Encounter Diagnosis  Name Primary?   History of colonic polyps Yes     Plan: Colonoscopy  The benefits and risks of the planned procedure were described in detail with the patient or (when appropriate) their health care proxy.  Risks were outlined as including, but not limited to, bleeding,  infection, perforation, adverse medication reaction leading to cardiac or pulmonary decompensation, pancreatitis (if ERCP).  The limitation of incomplete mucosal visualization was also discussed.  No guarantees or warranties were given.    The patient is appropriate for an endoscopic procedure in the ambulatory setting.   - Wilfrid Lund, MD

## 2022-09-22 NOTE — Progress Notes (Signed)
Pt's states no medical or surgical changes since previsit or office visit. 

## 2022-09-22 NOTE — Patient Instructions (Signed)
Resume previous diet and medications. Awaiting pathology results. Repeat Colonoscopy date to be determined based on pathology results. Handout provided on Colon Polyps.  YOU HAD AN ENDOSCOPIC PROCEDURE TODAY AT [facility]:   Refer to the procedure report that was given to you for any specific questions about what was found during the examination.  If the procedure report does not answer your questions, please call your gastroenterologist to clarify.  If you requested that your care partner not be given the details of your procedure findings, then the procedure report has been included in a sealed envelope for you to review at your convenience later.  YOU SHOULD EXPECT: Some feelings of bloating in the abdomen. Passage of more gas than usual.  Walking can help get rid of the air that was put into your GI tract during the procedure and reduce the bloating. If you had a lower endoscopy (such as a colonoscopy or flexible sigmoidoscopy) you may notice spotting of blood in your stool or on the toilet paper. If you underwent a bowel prep for your procedure, you may not have a normal bowel movement for a few days.  Please Note:  You might notice some irritation and congestion in your nose or some drainage.  This is from the oxygen used during your procedure.  There is no need for concern and it should clear up in a day or so.  SYMPTOMS TO REPORT IMMEDIATELY:  Following lower endoscopy (colonoscopy or flexible sigmoidoscopy):  Excessive amounts of blood in the stool  Significant tenderness or worsening of abdominal pains  Swelling of the abdomen that is new, acute  Fever of 100F or higher   For urgent or emergent issues, a gastroenterologist can be reached at any hour by calling [phone]. Do not use MyChart messaging for urgent concerns.    DIET:  We do recommend a small meal at first, but then you may proceed to your regular diet.  Drink plenty of fluids but you should avoid  alcoholic beverages for 24 hours.  ACTIVITY:  You should plan to take it easy for the rest of today and you should NOT DRIVE or use heavy machinery until tomorrow (because of the sedation medicines used during the test).    FOLLOW UP: Our staff will call the number listed on your records the next business day following your procedure.  We will call around 7:15- 8:00 am to check on you and address any questions or concerns that you may have regarding the information given to you following your procedure. If we do not reach you, we will leave a message.     If any biopsies were taken you will be contacted by phone or by letter within the next 1-3 weeks.  Please call us at [phone] if you have not heard about the biopsies in 3 weeks.    SIGNATURES/CONFIDENTIALITY: You and/or your care partner have signed paperwork which will be entered into your electronic medical record.  These signatures attest to the fact that that the information above on your After Visit Summary has been reviewed and is understood.  Full responsibility of the confidentiality of this discharge information lies with you and/or your care-partner.

## 2022-09-22 NOTE — Progress Notes (Signed)
Called to room to assist during endoscopic procedure.  Patient ID and intended procedure confirmed with present staff. Received instructions for my participation in the procedure from the performing physician.  

## 2022-09-22 NOTE — Op Note (Signed)
[place] Patient Name: Paul Morrison Procedure Date: 09/22/2022 9:25 AM MRN: [id number] Endoscopist: Mallie Mussel L. 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 The patient's tolerance of                            previous anesthesia was also reviewed. The risks                            and benefits of the procedure and the sedation                            options and risks were discussed with the patient.                            All questions were answered, and informed consent                            was obtained. Prior Anticoagulants: The patient has                            taken no anticoagulant or antiplatelet agents. ASA                            Grade Assessment: II - A patient with mild systemic                            disease. After reviewing the risks and benefits,  the patient was deemed in satisfactory condition to                            undergo the procedure.                           After obtaining informed consent, the colonoscope                            was passed under direct vision. Throughout the                            procedure, the patient's blood pressure, pulse, and                            oxygen saturations were  monitored continuously. The                            Olympus CF-HQ190L [id number]) Colonoscope was                            introduced through the anus and advanced to the the                            cecum, identified by appendiceal orifice and                            ileocecal valve. The colonoscopy was somewhat                            difficult due to fair bowel prep and a redundant                            colon as well as difficulty retaining insufflated                            air. Successful completion of the procedure was                            aided by using manual pressure, straightening and                            shortening the scope to obtain bowel loop reduction                            and lavage. The patient tolerated the procedure                            well. The quality of the bowel preparation was fair                            in some areas despite extensive lavage of opaque  liquid and fibrous debris. The ileocecal valve,                            appendiceal orifice, and rectum were photographed.                            The bowel preparation used was SUPREP via split                            dose instruction. Scope In: 9:31:15 AM Scope Out: 10:02:41 AM Scope Withdrawal Time: 0 hours 25 minutes 26 seconds  Total Procedure Duration: 0 hours 31 minutes 26 seconds  Findings:                 The perianal and digital rectal examinations were                            normal.                           Repeat examination of right colon under NBI                            performed.                           Three sessile polyps were found in the sigmoid                            colon and ascending colon. The polyps were 4 to 8                            mm in size. These polyps were removed with a cold                            snare. Resection and retrieval were complete.                           A 5 mm  polyp was found in the descending colon. The                            polyp was sessile. The polyp was removed with a                            cold snare. Resection and retrieval were complete.                           An 8 mm polyp was found in the sigmoid colon. The                            polyp was pedunculated. The polyp was removed with                            a hot snare. Resection and retrieval were  complete.                           Multiple diverticula were found in the left colon                            and right colon.                           Internal hemorrhoids were found.                           The exam was otherwise without abnormality on                            direct and retroflexion views. Complications:            No immediate complications. Estimated Blood Loss:     Estimated blood loss was minimal. Impression:               - Three 4 to 8 mm polyps in the sigmoid colon and                            in the ascending colon, removed with a cold snare.                            Resected and retrieved.                           - One 5 mm polyp in the descending colon, removed                            with a cold snare. Resected and retrieved.                           - One 8 mm polyp in the sigmoid colon, removed with                            a hot snare. Resected and retrieved.                           - Diverticulosis in the left colon and in the right                            colon.                           - Internal hemorrhoids.                           - The examination was otherwise normal on direct                            and retroflexion views. Recommendation:           - Patient has a contact number available for  emergencies. The signs and symptoms of potential                            delayed complications were discussed with the                            patient. Return to normal activities  tomorrow.                            Written discharge instructions were provided to the                            patient.                           - Resume previous diet.                           - Continue present medications.                           - Await pathology results.                           - Repeat colonoscopy in 3 years for surveillance.                            (Close attention to preprocedure dietary                            restrictions and consume more water with both doses                            of bowel prep for next colonoscopy-see prep details                            above) Mallie Mussel L. Loletha Carrow, MD 09/22/2022 10:09:19 AM This report has been signed electronically.

## 2022-09-22 NOTE — Progress Notes (Signed)
Report given to PACU, vss 

## 2022-09-25 ENCOUNTER — Telehealth: Payer: Self-pay | Admitting: *Deleted

## 2022-09-25 NOTE — Telephone Encounter (Signed)
Left message on f/u call 

## 2022-09-26 ENCOUNTER — Encounter: Payer: Self-pay | Admitting: Gastroenterology

## 2022-09-27 ENCOUNTER — Ambulatory Visit (INDEPENDENT_AMBULATORY_CARE_PROVIDER_SITE_OTHER): Payer: BC Managed Care – PPO | Admitting: Neurology

## 2022-09-27 ENCOUNTER — Encounter: Payer: Self-pay | Admitting: Neurology

## 2022-09-27 VITALS — BP 141/92 | HR 69 | Ht 75.0 in | Wt 268.8 lb

## 2022-09-27 DIAGNOSIS — G4733 Obstructive sleep apnea (adult) (pediatric): Secondary | ICD-10-CM | POA: Diagnosis not present

## 2022-09-27 DIAGNOSIS — G2581 Restless legs syndrome: Secondary | ICD-10-CM | POA: Diagnosis not present

## 2022-09-27 NOTE — Patient Instructions (Addendum)
It was nice to see you again today. I am glad to hear, things are going well with your autoPAP therapy. You have adjusted well to treatment with your new machine, and you are compliant with it. You have also fulfilled the insurance-mandated compliance percentage, which is reassuring, so you can get ongoing supplies through your insurance. Please talk to your DME provider about getting replacement supplies on a regular basis. Please be sure to change your filter every month, your mask about every 3 months, hose about every 6 months, humidifier chamber about yearly. Some restrictions are imposed by your insurance carrier with regard to how frequently you can get certain supplies.  Your DME company can provide further details if necessary.   Please continue using your autoPAP regularly. While your insurance requires that you use PAP at least 4 hours each night on 70% of the nights, I recommend, that you not skip any nights and use it throughout the night if you can. Getting used to PAP and staying with the treatment long term does take time and patience and discipline. Untreated obstructive sleep apnea when it is moderate to severe can have an adverse impact on cardiovascular health and raise her risk for heart disease, arrhythmias, hypertension, congestive heart failure, stroke and diabetes. Untreated obstructive sleep apnea causes sleep disruption, nonrestorative sleep, and sleep deprivation. This can have an impact on your day to day functioning and cause daytime sleepiness and impairment of cognitive function, memory loss, mood disturbance, and problems focussing. Using PAP regularly can improve these symptoms.  For your restless leg syndrome I recommend that you continue the pramipexole as prescribed by your PCP.  We can see you in 1 year, you can see one of our nurse practitioners as you are stable.

## 2022-09-27 NOTE — Progress Notes (Signed)
Subjective:    Patient ID: Paul Morrison is a 67 y.o. male.  HPI    Interim history:   Paul Morrison is a 67 year old gentleman with an underlying medical history of arthritis, skin cancer, hypertension, hyperlipidemia, low testosterone, anxiety, low back pain and mild obesity, who presents for follow-up consultation of his sleep disorder in particular, difficulty initiating sleep and restless leg symptoms.  The patient is accompanied by his wife today.  I first met him at the request of his primary care physician on 06/28/2022, at which time he reported chronic difficulty falling asleep, he was on pramipexole for restless leg syndrome since July 2023.  He had undergone multiple medication changes particularly after his back surgery.  I felt he was at risk for underlying obstructive sleep apnea and was advised to proceed with a sleep study.  He had a home sleep test on 07/11/2022 which showed an AHI of 14.3/h, O2 nadir 83% with variable snoring detected.  He was advised to proceed with home AutoPap therapy.  His set up date was 08/01/2022.  He has a ResMed air sense [place], DME company is [employer].   Today, 09/27/2022: I reviewed his AutoPap compliance data from 08/01/2022 through 08/30/2022, which is a total of 30 days, during which time he used his machine every night with percent use days greater than 4 hours at 100%, indicating superb compliance, average usage of 7 hours and 48 minutes, residual AHI at goal at 0.9/h, 95th percentile of pressure at 10 cm with a range of 8 to 12 cm with EPR of 3.  Leak acceptable with the 95th percentile at 13.5 L/min.  He has been consistently compliant in the first couple of months of treatment but had a gap of about 2 weeks in the early part of February 2024.  He reports feeling better overall.  He has adjusted to treatment, sometimes he takes the mask off if he has achieved 4 hours of sleep, but generally is quite motivated to continue with treatment.  Epworth  sleepiness score is 8 out of 24.  Restless leg symptoms under control on pramipexole.  He has had some weight fluctuation, had some weight gain but also admits that because of having COVID he was not as active recently.  He is using a medium ResMed F30i fullface mask, previously on a small size.  Sometimes has dry mouth and has changed the humidity setting but turned it back on to auto.  He did not sleep as well during a recent vacation trip, admits that he did not take his machine at the time.  This explains the labs and treatments in early February.  The patient's allergies, current medications, family history, past medical history, past social history, past surgical history and problem list were reviewed and updated as appropriate.   Previously:   06/28/22: (He) reports that restless leg symptoms are not his primary concern, he has chronic difficulty falling asleep.  He reports difficulty with his sleep since September 2022.  He had back surgery.  He does report that he was on high-dose pain medication before and after his back surgery, this includes taking generic Dilaudid and also other pain medications.  He was also on Cymbalta and gabapentin before his surgery but had significant side effects of these medications.  He was started on pramipexole for restless leg symptoms in July 2023 and gradually increased it to 3 pills each night.  He has not had any major side effects from the medication  but there was a concern at 1 point that he had visual side effects.  He saw a specialist ophthalmologist who advised him that Mirapex is not likely to cause him issues with his cornea.  He feels that Mirapex helps him fall asleep, he has tapered down a couple of months ago but then restarted it.  He does not take it daily, he takes 1 or 2 pills at night as needed.  He does not have any overt family history of restless leg syndrome.  He snores.  He has vivid dreams.  He has had dream enactment behavior.  This has been  going on for years.  He has a history of anxiety and sees a psychiatrist.  He also has therapy.  He is on Xanax as needed for anxiety, he used to take clonazepam as well.  His wife reports that his snoring improved.  He has a history of bruxism and uses a nightguard.  He has tried trazodone for sleep but is currently not on it.  He has nocturia about 2 or 3 times per average night, does not have any recurrent nocturnal or morning headaches.  He has been drinking alcohol every night for the past few months, he drinks about 3-4 drinks of liquor every night.  He admits that it helps him fall asleep.  He drinks caffeine in the form of coffee, 2 large cups per day, estimated to be 24 ounce altogether.  He is a non-smoker. He had blood work through your office on 05/03/2022 and I reviewed the results: CBC with differential showed hemoglobin elevated borderline at 17.1, otherwise benign findings, lipid panel showed total cholesterol 197, triglycerides mildly elevated at 209, HDL 46.3, CMP showed sodium of 139, potassium 4.1, glucose 101, BUN 22, creatinine 1.18, alk phos 45, AST 30, ALT 46.  PSA was normal at 1.53.  He has not had a sleep study. He reports to residual discomfort in his lower back. He does not believe he has sleep apnea and sleep testing would be a waste of time in his opinion but he is agreeable to pursuing sleep testing at this time.  Bedtime is generally around 10 PM and rise time 7 AM.  Epworth sleepiness score is 7 out of 24, fatigue severity score is 39 out of 63.  He reports stress particularly at work.  He uses a Visual merchandiser and an app to track his sleep.  He feels that his best REM sleep is in the morning hours.  He also brings in a write up of the neurological events in the recent past within the past year, and I reviewed his printed write up.      His Past Medical History Is Significant For: Past Medical History:  Diagnosis Date   Arthritis    Cancer ([place])    skin cancer left ear- MOHS    Hyperlipidemia    Hypertension    Low back pain     His Past Surgical History Is Significant For: Past Surgical History:  Procedure Laterality Date   COLONOSCOPY     HERNIA REPAIR     INJECTION KNEE     both knees   keratatomy     knee arthroscopy     LUMBAR FUSION     MOHS SURGERY     POLYPECTOMY     TOTAL KNEE ARTHROPLASTY Right 11/24/2016   Procedure: RIGHT TOTAL KNEE ARTHROPLASTY;  Surgeon: [name], MD;  Location: [facility];  Service: Orthopedics;  Laterality: Right;  VASECTOMY      His Family History Is Significant For: Family History  Problem Relation Age of Onset   Hyperlipidemia Mother    Hypertension Mother    Asthma Mother    Coronary artery disease Other    Aneurysm Other    Colon cancer Neg Hx    Colon polyps Neg Hx    Esophageal cancer Neg Hx    Rectal cancer Neg Hx    Stomach cancer Neg Hx     His Social History Is Significant For: Social History   Socioeconomic History   Marital status: Married    Spouse name: Not on file   Number of children: Not on file   Years of education: Not on file   Highest education level: Some college, no degree  Occupational History   Not on file  Tobacco Use   Smoking status: Never   Smokeless tobacco: Never  Vaping Use   Vaping Use: Never used  Substance and Sexual Activity   Alcohol use: Yes    Comment: Occas   Drug use: No   Sexual activity: Yes  Other Topics Concern   Not on file  Social History Narrative   Caffiene 2 cups in am   Work; 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   Social Determinants of Health   Financial Resource Strain: Low Risk  (10/27/2021)   Overall Financial Resource Strain (CARDIA)    Difficulty of Paying Living Expenses: Not hard at all  Food Insecurity: No Food Insecurity (08/10/2022)   Hunger Vital Sign    Worried About Running Out of Food in the Last Year: Never true    Ran Out of Food in the Last Year: Never true  Transportation Needs: No  Transportation Needs (08/10/2022)   PRAPARE - Hydrologist (Medical): No    Lack of Transportation (Non-Medical): No  Physical Activity: Sufficiently Active (10/27/2021)   Exercise Vital Sign    Days of Exercise per Week: 7 days    Minutes of Exercise per Session: 120 min  Stress: Stress Concern Present (10/27/2021)   [place]    Feeling of Stress : To some extent  Social Connections: Moderately Isolated (10/27/2021)   Social Connection and Isolation Panel [NHANES]    Frequency of Communication with Friends and Family: Three times a week    Frequency of Social Gatherings with Friends and Family: Once a week    Attends Religious Services: Never    Marine scientist or Organizations: No    Attends Music therapist: Not on file    Marital Status: Married    His Allergies Are:  Allergies  Allergen Reactions   Cephalexin Other (See Comments)    Stomach cramping    Celecoxib Rash  :   His Current Medications Are:  Outpatient Encounter Medications as of 09/27/2022  Medication Sig   allopurinol (ZYLOPRIM) 300 MG tablet Take 1 tablet (300 mg total) by mouth daily.   ALPRAZolam (XANAX) 0.5 MG tablet Take 1 tablet (0.5 mg total) by mouth at bedtime as needed for anxiety.   atorvastatin (LIPITOR) 40 MG tablet TAKE ONE TABLET DAILY   colchicine 0.6 MG tablet TAKE ONE TABLET TWICE DAILY AS NEEDED FOR GOUT FLARE   Glucosamine-Chondroitin 500-400 MG CAPS 1,200 mg.   indomethacin (INDOCIN) 50 MG capsule TAKE ONE (1) CAPSULE THREE (3) TIMES EACH DAY AS NEEDED   losartan (COZAAR) 100 MG  tablet Take 1 tablet (100 mg total) by mouth daily.   pramipexole (MIRAPEX) 0.125 MG tablet Take 3 tablets by mouth at bedtime.   tadalafil (CIALIS) 20 MG tablet Take one tablet by mouth every other day as needed for erectile dysfunction   Testosterone 1.62 % GEL APPLY 2 PUMP SPRAYS PER ARM ONCE DAILY  EARLY EACH MORNING   [DISCONTINUED] Semaglutide-Weight Management (WEGOVY) 0.25 MG/0.5ML SOAJ Inject 0.25 mg into the skin once a week.   No facility-administered encounter medications on file as of 09/27/2022.  :  Review of Systems:  Out of a complete 14 point review of systems, all are reviewed and negative with the exception of these symptoms as listed below:  Review of Systems  Neurological:        Initial cpap follow up.  ESS 8.  Did notice difference when out of town and did not take cpap.      Objective:  Neurological Exam  Physical Exam Physical Examination:   Vitals:   09/27/22 0950  BP: (!) 141/92  Pulse: 69    General Examination: The patient is a very pleasant 67 y.o. male in no acute distress. He appears well-developed and well-nourished and well groomed.   HEENT: Normocephalic, atraumatic, pupils are equal, round and reactive to light, extraocular tracking is good without limitation to gaze excursion or nystagmus noted.  Corrective eyeglasses in place.  Hearing is grossly intact. Face is symmetric with normal facial animation. Speech is clear with no dysarthria noted. There is no hypophonia. There is no lip, neck/head, jaw or voice tremor. Neck is supple with full range of passive and active motion. There are no carotid bruits on auscultation. Oropharynx exam reveals: mild mouth dryness, adequate dental hygiene and moderate airway crowding. Tonsils absent. Tongue protrudes centrally and palate elevates symmetrically.    Chest: Clear to auscultation without wheezing, rhonchi or crackles noted.   Heart: S1+S2+0, regular and normal without murmurs, rubs or gallops noted.    Abdomen: Soft, non-tender and non-distended.   Extremities: There is no pitting edema in the distal lower extremities bilaterally.    Skin: Warm and dry without trophic changes noted.    Musculoskeletal: exam reveals no obvious joint deformities.    Neurologically:  Mental status: The patient  is awake, alert and oriented in all 4 spheres. His immediate and remote memory, attention, language skills and fund of knowledge are appropriate. There is no evidence of aphasia, agnosia, apraxia or anomia. Speech is clear with normal prosody and enunciation. Thought process is linear. Mood is normal and affect is normal.  Cranial nerves II - XII are as described above under HEENT exam.  Motor exam: Normal bulk, strength and tone is noted. There is no obvious action or resting tremor.  Fine motor skills and coordination: grossly intact.  Cerebellar testing: No dysmetria or intention tremor. There is no truncal or gait ataxia.  Sensory exam: intact to light touch in the upper and lower extremities.  Gait, station and balance: He stands easily. No veering to one side is noted. No leaning to one side is noted. Posture is age-appropriate and stance is narrow based. Gait shows normal stride length and normal pace. No problems turning are noted.    Assessment and Plan:  In summary, Paul Morrison is a 67 year old gentleman with an underlying medical history of arthritis, skin cancer, hypertension, hyperlipidemia, low testosterone, anxiety, low back pain and mild obesity, who presents for follow-up consultation of his sleep disorder after interim sleep testing  at home and starting home AutoPap therapy.  He had a home sleep test in December 2023 which showed an AHI of 14.3/h, O2 nadir 83% with variable snoring.  He established treatment with AutoPap therapy at home in early January 2024 and is compliant with treatment.  He endorses improvement in his sleep quality and difficulty sleeping at night.  He is motivated to continue with treatment.  Restless legs are under control with pramipexole as prescribed by PCP.  He is advised to continue to be consistent with his AutoPap and commended for his treatment adherence.  He did have some questions about alternative treatment options, we talked about inspire today.  He  is encouraged to follow-up in sleep clinic routinely in 1 year and continue with full compliance on his AutoPap machine.  His numbers are at goal.  I answered all their questions today and the patient and his wife are in agreement.I spent 30 minutes in total face-to-face time and in reviewing records during pre-charting, more than 50% of which was spent in counseling and coordination of care, reviewing test results, reviewing medications and treatment regimen and/or in discussing or reviewing the diagnosis of OSA, RLS, the prognosis and treatment options. Pertinent laboratory and imaging test results that were available during this visit with the patient were reviewed by me and considered in my medical decision making (see chart for details).

## 2022-09-29 ENCOUNTER — Other Ambulatory Visit: Payer: Self-pay | Admitting: Family Medicine

## 2022-10-04 ENCOUNTER — Other Ambulatory Visit: Payer: Self-pay | Admitting: Family Medicine

## 2022-11-01 ENCOUNTER — Other Ambulatory Visit: Payer: Self-pay | Admitting: Family Medicine

## 2022-11-10 ENCOUNTER — Other Ambulatory Visit (HOSPITAL_COMMUNITY): Payer: Self-pay

## 2022-11-10 ENCOUNTER — Telehealth: Payer: Self-pay

## 2022-11-10 NOTE — Telephone Encounter (Signed)
Patient Advocate Encounter   Received notification from [facility] that prior authorization is required for Testosterone 1.62% gel   Submitted: 11-10-2022 Key [id number]  Status is pending

## 2022-11-11 ENCOUNTER — Other Ambulatory Visit: Payer: Self-pay | Admitting: Family Medicine

## 2022-11-12 ENCOUNTER — Encounter: Payer: Self-pay | Admitting: Gastroenterology

## 2022-11-21 NOTE — Telephone Encounter (Signed)
Received request for additional information, filled out and faxed to [facility] at [phone]

## 2022-11-27 NOTE — Telephone Encounter (Signed)
Patient Advocate Encounter  Prior Authorization for Testosterone 1.62% gel has been approved through [employer].  Key: [id number]    Effective: 11-23-2022 to 11-10-2023

## 2022-11-27 NOTE — Telephone Encounter (Signed)
Patient informed. 

## 2022-12-02 ENCOUNTER — Other Ambulatory Visit: Payer: Self-pay | Admitting: Family Medicine

## 2022-12-06 ENCOUNTER — Telehealth: Payer: BC Managed Care – PPO | Admitting: Family Medicine

## 2022-12-06 ENCOUNTER — Encounter: Payer: Self-pay | Admitting: Family Medicine

## 2022-12-06 DIAGNOSIS — T148XXA Other injury of unspecified body region, initial encounter: Secondary | ICD-10-CM

## 2022-12-06 NOTE — Telephone Encounter (Signed)
Duplicate-see prior Mychart message.

## 2022-12-06 NOTE — Progress Notes (Signed)
[place]   Out of state Will get care there if needed or wait until back in [place]   Patient acknowledged agreement and understanding of the plan.

## 2022-12-06 NOTE — Telephone Encounter (Signed)
Noted  

## 2023-01-09 ENCOUNTER — Encounter: Payer: Self-pay | Admitting: Neurology

## 2023-01-09 ENCOUNTER — Ambulatory Visit: Payer: BC Managed Care – PPO | Admitting: Neurology

## 2023-01-10 ENCOUNTER — Encounter: Payer: Self-pay | Admitting: Family Medicine

## 2023-01-10 ENCOUNTER — Encounter: Payer: Self-pay | Admitting: Neurology

## 2023-01-10 ENCOUNTER — Other Ambulatory Visit: Payer: Self-pay | Admitting: Family Medicine

## 2023-01-11 NOTE — Telephone Encounter (Signed)
Called pt, LVM to please call call office to schedule appointment.

## 2023-01-24 ENCOUNTER — Other Ambulatory Visit: Payer: Self-pay | Admitting: Family Medicine

## 2023-01-25 NOTE — Telephone Encounter (Signed)
Refilled.  Make sure pt has office follow up by September or October.  Will need follow up PSA and testosterone level by then.  Kristian Covey MD [facility] at [facility]

## 2023-01-26 ENCOUNTER — Other Ambulatory Visit: Payer: Self-pay

## 2023-02-14 ENCOUNTER — Other Ambulatory Visit: Payer: Self-pay | Admitting: Family Medicine

## 2023-02-26 ENCOUNTER — Encounter: Payer: Self-pay | Admitting: Neurology

## 2023-02-27 ENCOUNTER — Ambulatory Visit: Payer: BC Managed Care – PPO | Admitting: Neurology

## 2023-03-05 ENCOUNTER — Encounter: Payer: Self-pay | Admitting: Family Medicine

## 2023-03-06 MED ORDER — PRAMIPEXOLE DIHYDROCHLORIDE 0.5 MG PO TABS: 0.5000 mg | ORAL_TABLET | Freq: Every day | ORAL | 3 refills | Status: DC

## 2023-04-09 ENCOUNTER — Other Ambulatory Visit: Payer: Self-pay | Admitting: Family Medicine

## 2023-05-15 ENCOUNTER — Other Ambulatory Visit: Payer: Self-pay | Admitting: Family Medicine

## 2023-06-05 DIAGNOSIS — M722 Plantar fascial fibromatosis: Secondary | ICD-10-CM | POA: Diagnosis not present

## 2023-06-06 DIAGNOSIS — H25812 Combined forms of age-related cataract, left eye: Secondary | ICD-10-CM | POA: Diagnosis not present

## 2023-06-06 DIAGNOSIS — H2512 Age-related nuclear cataract, left eye: Secondary | ICD-10-CM | POA: Diagnosis not present

## 2023-06-11 DIAGNOSIS — M722 Plantar fascial fibromatosis: Secondary | ICD-10-CM | POA: Diagnosis not present

## 2023-06-13 ENCOUNTER — Other Ambulatory Visit: Payer: Self-pay | Admitting: Family Medicine

## 2023-06-25 ENCOUNTER — Encounter: Payer: Self-pay | Admitting: Family Medicine

## 2023-06-26 ENCOUNTER — Ambulatory Visit (INDEPENDENT_AMBULATORY_CARE_PROVIDER_SITE_OTHER): Payer: Medicare HMO | Admitting: Family Medicine

## 2023-06-26 VITALS — BP 138/88 | HR 73 | Temp 98.1°F | Ht 75.0 in | Wt 266.0 lb

## 2023-06-26 DIAGNOSIS — E782 Mixed hyperlipidemia: Secondary | ICD-10-CM

## 2023-06-26 DIAGNOSIS — I10 Essential (primary) hypertension: Secondary | ICD-10-CM

## 2023-06-26 DIAGNOSIS — M722 Plantar fascial fibromatosis: Secondary | ICD-10-CM | POA: Diagnosis not present

## 2023-06-26 DIAGNOSIS — E291 Testicular hypofunction: Secondary | ICD-10-CM

## 2023-06-26 DIAGNOSIS — M1000 Idiopathic gout, unspecified site: Secondary | ICD-10-CM | POA: Diagnosis not present

## 2023-06-26 MED ORDER — INDOMETHACIN 50 MG PO CAPS: ORAL_CAPSULE | ORAL | 1 refills | Status: DC

## 2023-06-26 MED ORDER — METHYLPREDNISOLONE ACETATE 40 MG/ML IJ SUSP
30.0000 mg | Freq: Once | INTRAMUSCULAR | Status: AC
  Administered 2023-06-26: 30 mg via INTRAMUSCULAR

## 2023-06-26 MED ORDER — LOSARTAN POTASSIUM 100 MG PO TABS: 100.0000 mg | ORAL_TABLET | Freq: Every day | ORAL | 3 refills | Status: DC

## 2023-06-26 MED ORDER — ALLOPURINOL 300 MG PO TABS: 300.0000 mg | ORAL_TABLET | Freq: Every day | ORAL | 3 refills | Status: DC

## 2023-06-26 MED ORDER — TESTOSTERONE 1.62 % TD GEL: TRANSDERMAL | 2 refills | Status: DC

## 2023-06-26 MED ORDER — ATORVASTATIN CALCIUM 40 MG PO TABS: 40.0000 mg | ORAL_TABLET | Freq: Every day | ORAL | 3 refills | Status: DC

## 2023-06-26 MED ORDER — COLCHICINE 0.6 MG PO TABS: ORAL_TABLET | ORAL | 3 refills | Status: AC

## 2023-06-26 NOTE — Patient Instructions (Signed)
Go for labs next Tuesday for labs at [facility].

## 2023-06-26 NOTE — Progress Notes (Signed)
Established Patient Office Visit  Subjective   Patient ID: Paul Morrison, male    DOB: [birth year]  Age: 67 y.o. MRN: [id number]  Chief Complaint  Patient presents with   Foot Pain    Patient complains of Bilateral foot pain, x1.5 months, Tried Aleve and Tylenol     HPI   Paul Morrison is seen today for multiple items.  Paul Morrison has acute on chronic issue of bilateral foot pain especially left heel.  Has had plantar fasciitis in the past.  Similar symptoms now.  Worse after prolonged periods of walking and also first thing in the morning.  Paul Morrison has tried physical therapy which did help previously with a couple of treatments recently including dry needling and heat along with stretches and Aleve without much improvement.  Paul Morrison is concerned because Paul Morrison has upcoming trip to [place] in February and wants to get improved before then.  No Achilles pain.  Paul Morrison has inserts in his shoes which have not helped much.  His other chronic problems include hypertension, hypogonadism, hyperlipidemia, gout.  Gout has been stable on allopurinol.  Paul Morrison needs follow-up labs regarding his testosterone therapy.  On topical testosterone.  Needs follow-up PSA along with CBC.  Remains on atorvastatin for hyperlipidemia.  No recent myalgias.  Past Medical History:  Diagnosis Date   Arthritis    Cancer (HCC)    skin cancer left ear- MOHS   Hyperlipidemia    Hypertension    Low back pain    Past Surgical History:  Procedure Laterality Date   COLONOSCOPY     HERNIA REPAIR     INJECTION KNEE     both knees   keratatomy     knee arthroscopy     LUMBAR FUSION     MOHS SURGERY     POLYPECTOMY     TOTAL KNEE ARTHROPLASTY Right 11/24/2016   Procedure: RIGHT TOTAL KNEE ARTHROPLASTY;  Surgeon: Eugenia Mcalpine, MD;  Location: [facility];  Service: Orthopedics;  Laterality: Right;   VASECTOMY      reports that Paul Morrison has never smoked. Paul Morrison has never used smokeless tobacco. Paul Morrison reports current alcohol use. Paul Morrison reports that Paul Morrison does not use  drugs. family history includes Aneurysm in an other family member; Asthma in his mother; Coronary artery disease in an other family member; Hyperlipidemia in his mother; Hypertension in his mother. Allergies  Allergen Reactions   Cephalexin Other (See Comments)    Stomach cramping    Celecoxib Rash     Review of Systems  Constitutional:  Negative for chills, fever and malaise/fatigue.  Eyes:  Negative for blurred vision.  Respiratory:  Negative for shortness of breath.   Cardiovascular:  Negative for chest pain.  Neurological:  Negative for dizziness, weakness and headaches.      Objective:     BP 138/88 (BP Location: Left Arm, Cuff Size: Normal)   Pulse 73   Temp 98.1 F (36.7 C) (Oral)   Ht 6\' 3"  (1.905 m)   Wt 266 lb (120.7 kg)   SpO2 97%   BMI 33.25 kg/m  BP Readings from Last 3 Encounters:  06/26/23 138/88  09/27/22 (!) 141/92  09/22/22 124/80   Wt Readings from Last 3 Encounters:  06/26/23 266 lb (120.7 kg)  09/27/22 268 lb 12.8 oz (121.9 kg)  09/22/22 250 lb (113.4 kg)      Physical Exam Vitals reviewed.  Constitutional:      Appearance: Normal appearance.  Cardiovascular:     Rate and  Rhythm: Normal rate and regular rhythm.  Pulmonary:     Effort: Pulmonary effort is normal.     Breath sounds: Normal breath sounds. No wheezing or rales.  Musculoskeletal:     Comments: Left foot examined.  No Achilles tenderness.  Paul Morrison has tenderness plantar fascia attachment near the calcaneus.  No warmth or erythema.  Neurological:     Mental Status: Paul Morrison is alert.      No results found for any visits on 06/26/23.    The 10-year ASCVD risk score (Arnett DK, et al., 2019) is: 18.5%    Assessment & Plan:   #1 plantar fasciitis.  Paul Morrison had some involvement of both feet but especially left foot currently.  Has tried physical therapy without much improvement.  Tried multiple things including Aleve, stretches, heat, dry needling.  Paul Morrison specifically had questions about  steroid injection.  We discussed potential risk including low risk of infection and associated pain with injection.  Patient consented.  We recommended medial approach to reduce risk of fat pad atrophy.  We prepped skin with Betadine.  Using 25-gauge 1 inch needle infiltrated subcutaneous tissue with 1% plain Xylocaine.  We then used 22-gauge 1-1/2 inch needle and injected 30 mg of Depo-Medrol and 1 cc of plain Xylocaine using medial approach.  Patient tolerated well.  -Recommend some icing for the next several days -Continue with diligent stretching -Be in touch if not improving over the next couple weeks  #2 hypertension.  Initially up today but improved some after rest.  Continue losartan with refills provided.  We have challenged him to try to lose some weight  #3 hyperlipidemia treated with atorvastatin.  Overdue for labs.  Future lab order written for lipids and CMP.  Refill atorvastatin for 1 year  #4 history of gout currently stable on allopurinol.  Refill allopurinol for 1 year.  Check uric acid level with upcoming labs  #5 hypogonadism.  Refill testosterone.  Check labs with CBC, PSA, total testosterone level     No follow-ups on file.    [name], MD

## 2023-07-03 ENCOUNTER — Other Ambulatory Visit: Payer: Self-pay

## 2023-07-03 ENCOUNTER — Other Ambulatory Visit (INDEPENDENT_AMBULATORY_CARE_PROVIDER_SITE_OTHER): Payer: Medicare HMO

## 2023-07-03 DIAGNOSIS — E291 Testicular hypofunction: Secondary | ICD-10-CM | POA: Diagnosis not present

## 2023-07-03 DIAGNOSIS — M1000 Idiopathic gout, unspecified site: Secondary | ICD-10-CM

## 2023-07-03 DIAGNOSIS — D751 Secondary polycythemia: Secondary | ICD-10-CM

## 2023-07-03 DIAGNOSIS — E782 Mixed hyperlipidemia: Secondary | ICD-10-CM | POA: Diagnosis not present

## 2023-07-03 LAB — TESTOSTERONE: Testosterone: 303.46 ng/dL (ref 300.00–890.00)

## 2023-07-03 LAB — CBC WITH DIFFERENTIAL/PLATELET
Basophils Absolute: 0 10*3/uL (ref 0.0–0.1)
Basophils Relative: 0.4 % (ref 0.0–3.0)
Eosinophils Absolute: 0.4 10*3/uL (ref 0.0–0.7)
Eosinophils Relative: 6.6 % — ABNORMAL HIGH (ref 0.0–5.0)
HCT: 49.5 % (ref 39.0–52.0)
Hemoglobin: 16.4 g/dL (ref 13.0–17.0)
Lymphocytes Relative: 20.9 % (ref 12.0–46.0)
Lymphs Abs: 1.4 10*3/uL (ref 0.7–4.0)
MCHC: 33.2 g/dL (ref 30.0–36.0)
MCV: 97.3 fL (ref 78.0–100.0)
Monocytes Absolute: 0.8 10*3/uL (ref 0.1–1.0)
Monocytes Relative: 11.2 % (ref 3.0–12.0)
Neutro Abs: 4.1 10*3/uL (ref 1.4–7.7)
Neutrophils Relative %: 60.9 % (ref 43.0–77.0)
Platelets: 166 10*3/uL (ref 150.0–400.0)
RBC: 5.09 Mil/uL (ref 4.22–5.81)
RDW: 13.9 % (ref 11.5–15.5)
WBC: 6.7 10*3/uL (ref 4.0–10.5)

## 2023-07-03 LAB — PSA: PSA: 1.82 ng/mL (ref 0.10–4.00)

## 2023-07-03 LAB — LIPID PANEL
Cholesterol: 199 mg/dL (ref 0–200)
HDL: 43.1 mg/dL (ref >39.00)
LDL Cholesterol: 112 mg/dL — ABNORMAL HIGH (ref 0–99)
NonHDL: 155.78
Total CHOL/HDL Ratio: 5
Triglycerides: 221 mg/dL — ABNORMAL HIGH (ref 0.0–149.0)
VLDL: 44.2 mg/dL — ABNORMAL HIGH (ref 0.0–40.0)

## 2023-07-03 LAB — COMPREHENSIVE METABOLIC PANEL
ALT: 53 U/L (ref 0–53)
AST: 30 U/L (ref 0–37)
Albumin: 4.3 g/dL (ref 3.5–5.2)
Alkaline Phosphatase: 45 U/L (ref 39–117)
BUN: 14 mg/dL (ref 6–23)
CO2: 28 meq/L (ref 19–32)
Calcium: 9.4 mg/dL (ref 8.4–10.5)
Chloride: 101 meq/L (ref 96–112)
Creatinine, Ser: 1.04 mg/dL (ref 0.40–1.50)
GFR: 74.85 mL/min (ref >60.00)
Glucose, Bld: 137 mg/dL — ABNORMAL HIGH (ref 70–99)
Potassium: 4.2 meq/L (ref 3.5–5.1)
Sodium: 137 meq/L (ref 135–145)
Total Bilirubin: 0.9 mg/dL (ref 0.2–1.2)
Total Protein: 6.3 g/dL (ref 6.0–8.3)

## 2023-07-03 LAB — URIC ACID: Uric Acid, Serum: 4.6 mg/dL (ref 4.0–7.8)

## 2023-07-10 ENCOUNTER — Ambulatory Visit (INDEPENDENT_AMBULATORY_CARE_PROVIDER_SITE_OTHER): Payer: Medicare HMO | Admitting: Family Medicine

## 2023-07-10 VITALS — BP 140/80 | HR 88 | Temp 97.4°F | Ht 75.0 in | Wt 264.6 lb

## 2023-07-10 DIAGNOSIS — R7309 Other abnormal glucose: Secondary | ICD-10-CM | POA: Diagnosis not present

## 2023-07-10 DIAGNOSIS — E782 Mixed hyperlipidemia: Secondary | ICD-10-CM | POA: Diagnosis not present

## 2023-07-10 DIAGNOSIS — R051 Acute cough: Secondary | ICD-10-CM | POA: Diagnosis not present

## 2023-07-10 DIAGNOSIS — I10 Essential (primary) hypertension: Secondary | ICD-10-CM | POA: Diagnosis not present

## 2023-07-10 DIAGNOSIS — E291 Testicular hypofunction: Secondary | ICD-10-CM | POA: Diagnosis not present

## 2023-07-10 LAB — POCT GLYCOSYLATED HEMOGLOBIN (HGB A1C): Hemoglobin A1C: 5.7 % — AB (ref 4.0–5.6)

## 2023-07-10 MED ORDER — ATORVASTATIN CALCIUM 80 MG PO TABS: 80.0000 mg | ORAL_TABLET | Freq: Every day | ORAL | 3 refills | Status: DC

## 2023-07-10 MED ORDER — BENZONATATE 100 MG PO CAPS: 100.0000 mg | ORAL_CAPSULE | Freq: Three times a day (TID) | ORAL | 0 refills | Status: DC | PRN

## 2023-07-10 NOTE — Patient Instructions (Signed)
A1C 5.7%- which is upper limit of normal.    Let's up the Lipitor to 80 mg daily  Consider follow up lipid panel in about 2 months.

## 2023-07-10 NOTE — Progress Notes (Signed)
Established Patient Office Visit  Subjective   Patient ID: Paul Morrison, male    DOB: November 19, 1955  Age: 67 y.o. MRN: [id number]  Chief Complaint  Patient presents with   Cough    Patient complains of cough, x2 weeks, Productive with yellow sputum, Tried Delsym     HPI   [name] is seen for medical follow-up.  He has history of hypertension, hypogonadism, hyperlipidemia, obesity.  He had recent lab work done with several abnormalities and we recommended follow-up.  He had glucose of 137 which was fasting.  We recommend follow-up today for A1c check.  His LDL cholesterol is 118.  He currently takes atorvastatin 40 mg daily and tolerating well.  Testosterone was low normal range.  He has decided to stop testosterone replacement as he has been having some consistent bad dreams and nightmares when taking testosterone.  Uric acid level was to goal at 4.6.  PSA stable 1.82.  CBC stable.  He does have acute issue of 2-week history of cough and some nasal congestion.  Cough has been productive at times.  Has taken over-the-counter medications including Delsym and Tylenol.  No fever.  No dyspnea.  Regarding recent plantar fasciitis he has followed up with physical therapist and apparently had some iontophoresis with steroids.  He does think the injection he had recently here has helped somewhat.  Slowly getting better.  Past Medical History:  Diagnosis Date   Arthritis    Cancer (HCC)    skin cancer left ear- MOHS   Hyperlipidemia    Hypertension    Low back pain    Past Surgical History:  Procedure Laterality Date   COLONOSCOPY     HERNIA REPAIR     INJECTION KNEE     both knees   keratatomy     knee arthroscopy     LUMBAR FUSION     MOHS SURGERY     POLYPECTOMY     TOTAL KNEE ARTHROPLASTY Right 11/24/2016   Procedure: RIGHT TOTAL KNEE ARTHROPLASTY;  Surgeon: Eugenia Mcalpine, MD;  Location: [facility];  Service: Orthopedics;  Laterality: Right;   VASECTOMY      reports that he has  never smoked. He has never used smokeless tobacco. He reports current alcohol use. He reports that he does not use drugs. family history includes Aneurysm in an other family member; Asthma in his mother; Coronary artery disease in an other family member; Hyperlipidemia in his mother; Hypertension in his mother. Allergies  Allergen Reactions   Cephalexin Other (See Comments)    Stomach cramping    Celecoxib Rash    Review of Systems  Constitutional:  Negative for chills, fever and malaise/fatigue.  HENT:  Positive for congestion.   Respiratory:  Positive for cough. Negative for shortness of breath.   Cardiovascular:  Negative for chest pain.  Neurological:  Negative for dizziness, weakness and headaches.      Objective:     BP (!) 140/80   Pulse 88   Temp (!) 97.4 F (36.3 C) (Oral)   Ht 6\' 3"  (1.905 m)   Wt 264 lb 9.6 oz (120 kg)   SpO2 98%   BMI 33.07 kg/m  BP Readings from Last 3 Encounters:  07/10/23 (!) 140/80  06/26/23 138/88  09/27/22 (!) 141/92   Wt Readings from Last 3 Encounters:  07/10/23 264 lb 9.6 oz (120 kg)  06/26/23 266 lb (120.7 kg)  09/27/22 268 lb 12.8 oz (121.9 kg)      Physical  Exam Vitals reviewed.  Constitutional:      General: He is not in acute distress.    Appearance: He is not ill-appearing.  Cardiovascular:     Rate and Rhythm: Normal rate and regular rhythm.  Pulmonary:     Effort: Pulmonary effort is normal.     Breath sounds: Normal breath sounds. No wheezing or rales.  Neurological:     Mental Status: He is alert.      Results for orders placed or performed in visit on 07/10/23  POC HgB A1c  Result Value Ref Range   Hemoglobin A1C 5.7 (A) 4.0 - 5.6 %   HbA1c POC (<> result, manual entry)     HbA1c, POC (prediabetic range)     HbA1c, POC (controlled diabetic range)      Last CBC Lab Results  Component Value Date   WBC 6.7 07/03/2023   HGB 16.4 07/03/2023   HCT 49.5 07/03/2023   MCV 97.3 07/03/2023   MCH 31.8  04/12/2021   RDW 13.9 07/03/2023   PLT 166.0 07/03/2023   Last metabolic panel Lab Results  Component Value Date   GLUCOSE 137 (H) 07/03/2023   NA 137 07/03/2023   K 4.2 07/03/2023   CL 101 07/03/2023   CO2 28 07/03/2023   BUN 14 07/03/2023   CREATININE 1.04 07/03/2023   GFR 74.85 07/03/2023   CALCIUM 9.4 07/03/2023   PROT 6.3 07/03/2023   ALBUMIN 4.3 07/03/2023   BILITOT 0.9 07/03/2023   ALKPHOS 45 07/03/2023   AST 30 07/03/2023   ALT 53 07/03/2023   ANIONGAP 11 04/12/2021   Last lipids Lab Results  Component Value Date   CHOL 199 07/03/2023   HDL 43.10 07/03/2023   LDLCALC 112 (H) 07/03/2023   LDLDIRECT 116.0 05/03/2022   TRIG 221.0 (H) 07/03/2023   CHOLHDL 5 07/03/2023   Last hemoglobin A1c Lab Results  Component Value Date   HGBA1C 5.7 (A) 07/10/2023      The 10-year ASCVD risk score (Arnett DK, et al., 2019) is: 19.8%    Assessment & Plan:   #1 hyperglycemia.  Recent fasting glucose 137.  A1c today 5.7%.  Discussed importance of weight control and lower glycemic diet.  Continue regular exercise habits.  Consider repeat A1c in about 6 to 12 months  #2 hyperlipidemia.  Recent LDL cholesterol 112.  Discussed options of further titration of statin versus addition of Zetia and he prefers the former.  Will titrate atorvastatin 80 mg daily and watch for any side effects.  Recheck fasting lipids in a couple months  #3 low testosterone.  Patient has been on replacement for quite some time.  He decided to stop this secondary to some abnormal dreams.  #4 cough.  Suspect secondary to acute viral bronchitis.  Nonfocal exam.  No fever.  Tessalon Perles 100 mg every 8 hours as needed for cough.  Follow-up immediately for fever or increased shortness of breath   No follow-ups on file.    [name], MD

## 2023-08-17 ENCOUNTER — Ambulatory Visit (INDEPENDENT_AMBULATORY_CARE_PROVIDER_SITE_OTHER): Payer: Medicare HMO | Admitting: Family Medicine

## 2023-08-17 DIAGNOSIS — M533 Sacrococcygeal disorders, not elsewhere classified: Secondary | ICD-10-CM

## 2023-08-17 DIAGNOSIS — M1000 Idiopathic gout, unspecified site: Secondary | ICD-10-CM | POA: Diagnosis not present

## 2023-08-17 MED ORDER — PREDNISONE 20 MG PO TABS: ORAL_TABLET | ORAL | 0 refills | Status: DC

## 2023-08-17 MED ORDER — TRAMADOL HCL 50 MG PO TABS: 50.0000 mg | ORAL_TABLET | Freq: Four times a day (QID) | ORAL | 0 refills | Status: AC | PRN

## 2023-08-17 NOTE — Patient Instructions (Signed)
I will send in the Tramadol and prednisone for your trip.
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
## 2023-08-29 ENCOUNTER — Encounter: Payer: Self-pay | Admitting: Family Medicine

## 2023-08-29 MED ORDER — TRAMADOL HCL 50 MG PO TABS: 50.0000 mg | ORAL_TABLET | Freq: Four times a day (QID) | ORAL | 0 refills | Status: AC | PRN

## 2023-10-02 ENCOUNTER — Telehealth: Payer: Self-pay | Admitting: Neurology

## 2023-10-02 NOTE — Progress Notes (Deleted)
 [facility] [address] [place]. [place] [address] [phone]       OFFICE FOLLOW UP NOTE  Mr. Paul Morrison Date of Birth:  [birth year] Medical Record Number:  [id number]    Primary neurologist: Dr. Frances Furbish Reason for visit: Initial CPAP follow-up    SUBJECTIVE:   CHIEF COMPLAINT:  No chief complaint on file.   Follow-up visit:  Prior visit: 09/27/2022  Brief HPI:   Paul Morrison is a 68 y.o. male who was evaluated by Dr. Frances Furbish in 05/2022 for sleep disorder, in particular difficulty initiating and maintaining sleep and restless leg symptoms, endorse snoring, vivid dreams, and dream enactment behavior.  He was placed on Mirapex by PCP which helped him fall asleep.  Recommended proceeding with sleep study as well as blood work to check TSH, B12 and iron studies.  HST 06/2022 showed overall mild sleep apnea with total AHI of 14.3/h and O2 nadir of 83%.  AutoPap therapy initiated 07/2022.  Lab work overall benign.  At prior visit, compliance report showed excellent compliance and optimal residual AHI.  Reported overall feeling better and RLS symptoms controlled with Mirapex.     Interval history:        ROS:   14 system review of systems performed and negative with exception of those listed in HPI  PMH:  Past Medical History:  Diagnosis Date   Arthritis    Cancer (HCC)    skin cancer left ear- MOHS   Hyperlipidemia    Hypertension    Low back pain     PSH:  Past Surgical History:  Procedure Laterality Date   COLONOSCOPY     HERNIA REPAIR     INJECTION KNEE     both knees   keratatomy     knee arthroscopy     LUMBAR FUSION     MOHS SURGERY     POLYPECTOMY     TOTAL KNEE ARTHROPLASTY Right 11/24/2016   Procedure: RIGHT TOTAL KNEE ARTHROPLASTY;  Surgeon: Eugenia Mcalpine, MD;  Location: [facility];  Service: Orthopedics;  Laterality: Right;   VASECTOMY      Social History:  Social History   Socioeconomic History   Marital  status: Married    Spouse name: Not on file   Number of children: Not on file   Years of education: Not on file   Highest education level: 12th grade  Occupational History   Not on file  Tobacco Use   Smoking status: Never   Smokeless tobacco: Never  Vaping Use   Vaping status: Never Used  Substance and Sexual Activity   Alcohol use: Yes    Comment: Occas   Drug use: No   Sexual activity: Yes  Other Topics Concern   Not on file  Social History Narrative   Caffiene 2 cups in am   Work; Holiday representative (historic properties) in [place],    Married.   Social Drivers of Corporate investment banker Strain: Low Risk  (08/17/2023)   Overall Financial Resource Strain (CARDIA)    Difficulty of Paying Living Expenses: Not hard at all  Food Insecurity: No Food Insecurity (08/17/2023)   Hunger Vital Sign    Worried About Running Out of Food in the Last Year: Never true    Ran Out of Food in the Last Year: Never true  Transportation Needs: No Transportation Needs (08/17/2023)   PRAPARE - Administrator, Civil Service (Medical): No    Lack of Transportation (Non-Medical): No  Physical Activity: Sufficiently Active (08/17/2023)   Exercise Vital Sign    Days of Exercise per Week: 5 days    Minutes of Exercise per Session: 60 min  Stress: Stress Concern Present (08/17/2023)   [employer] of Occupational Health - Occupational Stress Questionnaire    Feeling of Stress : To some extent  Social Connections: Moderately Isolated (08/17/2023)   Social Connection and Isolation Panel [NHANES]    Frequency of Communication with Friends and Family: Three times a week    Frequency of Social Gatherings with Friends and Family: Once a week    Attends Religious Services: Never    Database administrator or Organizations: No    Attends Engineer, structural: Not on file    Marital Status: Married  Catering manager Violence: Not on file    Family History:  Family History   Problem Relation Age of Onset   Hyperlipidemia Mother    Hypertension Mother    Asthma Mother    Coronary artery disease Other    Aneurysm Other    Colon cancer Neg Hx    Colon polyps Neg Hx    Esophageal cancer Neg Hx    Rectal cancer Neg Hx    Stomach cancer Neg Hx     Medications:   Current Outpatient Medications on File Prior to Visit  Medication Sig Dispense Refill   allopurinol (ZYLOPRIM) 300 MG tablet Take 1 tablet (300 mg total) by mouth daily. 90 tablet 3   atorvastatin (LIPITOR) 80 MG tablet Take 1 tablet (80 mg total) by mouth daily. 90 tablet 3   benzonatate (TESSALON PERLES) 100 MG capsule Take 1 capsule (100 mg total) by mouth 3 (three) times daily as needed. 30 capsule 0   colchicine 0.6 MG tablet Take one tablet twice daily as needed for gout flare 60 tablet 3   indomethacin (INDOCIN) 50 MG capsule TAKE ONE CAPSULE THREE TIMES DAILY AS NEEDED 30 capsule 1   losartan (COZAAR) 100 MG tablet Take 1 tablet (100 mg total) by mouth daily. 90 tablet 3   predniSONE (DELTASONE) 20 MG tablet Take 2 tablets by mouth once daily for 6 days as needed for gout flare 12 tablet 0   tadalafil (CIALIS) 20 MG tablet Take one tablet by mouth every other day as needed for erectile dysfunction 10 tablet 5   Testosterone 1.62 % GEL APPLY 2 PUMP SPRAYS PER ARM ONCE DAILY EARLY EACH MORNING 150 g 2   No current facility-administered medications on file prior to visit.    Allergies:   Allergies  Allergen Reactions   Cephalexin Other (See Comments)    Stomach cramping    Celecoxib Rash      OBJECTIVE:  Physical Exam  There were no vitals filed for this visit. There is no height or weight on file to calculate BMI. No results found.   General: well developed, well nourished, seated, in no evident distress Head: head normocephalic and atraumatic.   Neck: supple with no carotid or supraclavicular bruits Cardiovascular: regular rate and rhythm, no murmurs Musculoskeletal: no  deformity Skin:  no rash/petichiae Vascular:  Normal pulses all extremities   Neurologic Exam Mental Status: Awake and fully alert. Oriented to place and time. Recent and remote memory intact. Attention span, concentration and fund of knowledge appropriate. Mood and affect appropriate.  Cranial Nerves: Pupils equal, briskly reactive to light. Extraocular movements full without nystagmus. Visual fields full to confrontation. Hearing intact. Facial sensation intact. Face, tongue, palate moves  normally and symmetrically.  Motor: Normal bulk and tone. Normal strength in all tested extremity muscles Gait and Station: Arises from chair without difficulty. Stance is normal. Gait demonstrates normal stride length and balance without use of AD. Tandem walk and heel toe without difficulty.         ASSESSMENT/PLAN: Paul Morrison is a 68 y.o. year old male    OSA on CPAP : Compliance report shows satisfactory usage with optimal residual AHI.  Continue current pressure settings.  Discussed continued nightly usage with ensuring greater than 4 hours nightly for optimal benefit and per insurance purposes.  Continue to follow with DME company for any needed supplies or CPAP related concerns     Follow up in *** or call earlier if needed   CC:  PCP: Kristian Covey, MD    I spent *** minutes of face-to-face and non-face-to-face time with patient.  This included previsit chart review, lab review, study review, order entry, electronic health record documentation, patient education and discussion regarding above diagnoses and treatment plan and answered all other questions to patient's satisfaction    Ihor Austin, [facility]  [facility] [address] [place], [place] [address]  Phone [phone] Fax [phone] Note: This document was prepared with digital dictation and possible smart phrase technology. Any transcriptional errors that result from this  process are unintentional.

## 2023-10-02 NOTE — Telephone Encounter (Signed)
 Pt cancel appointment due to  sick after going out to the country. Pt decided did not want to reschedule appointment

## 2023-10-03 ENCOUNTER — Ambulatory Visit: Payer: BC Managed Care – PPO | Admitting: Adult Health

## 2023-10-09 DIAGNOSIS — H5213 Myopia, bilateral: Secondary | ICD-10-CM | POA: Diagnosis not present

## 2023-10-09 DIAGNOSIS — Z0100 Encounter for examination of eyes and vision without abnormal findings: Secondary | ICD-10-CM | POA: Diagnosis not present

## 2023-10-10 ENCOUNTER — Encounter: Payer: Self-pay | Admitting: Family Medicine

## 2023-10-16 ENCOUNTER — Other Ambulatory Visit (HOSPITAL_COMMUNITY): Payer: Self-pay

## 2023-10-16 NOTE — Telephone Encounter (Signed)
 Good afternoon, I did a test claim for his indomethacin and [employer] pd for it and pt's copay was $2.54

## 2023-10-22 ENCOUNTER — Other Ambulatory Visit: Payer: Self-pay | Admitting: Family Medicine

## 2023-10-26 ENCOUNTER — Ambulatory Visit: Admitting: Physician Assistant

## 2023-10-26 VITALS — BP 146/105 | HR 91 | Ht 74.0 in | Wt 265.0 lb

## 2023-10-26 DIAGNOSIS — N529 Male erectile dysfunction, unspecified: Secondary | ICD-10-CM

## 2023-10-26 DIAGNOSIS — R399 Unspecified symptoms and signs involving the genitourinary system: Secondary | ICD-10-CM

## 2023-10-26 DIAGNOSIS — R350 Frequency of micturition: Secondary | ICD-10-CM

## 2023-10-26 DIAGNOSIS — E291 Testicular hypofunction: Secondary | ICD-10-CM | POA: Diagnosis not present

## 2023-10-26 LAB — URINALYSIS, COMPLETE
Bilirubin, UA: NEGATIVE
Glucose, UA: NEGATIVE
Ketones, UA: NEGATIVE
Leukocytes,UA: NEGATIVE
Nitrite, UA: NEGATIVE
RBC, UA: NEGATIVE
Specific Gravity, UA: 1.02 (ref 1.005–1.030)
Urobilinogen, Ur: 0.2 mg/dL (ref 0.2–1.0)
pH, UA: 7.5 (ref 5.0–7.5)

## 2023-10-26 LAB — MICROSCOPIC EXAMINATION

## 2023-10-26 LAB — BLADDER SCAN AMB NON-IMAGING: Scan Result: 11

## 2023-10-26 MED ORDER — TADALAFIL 5 MG PO TABS: 5.0000 mg | ORAL_TABLET | Freq: Every day | ORAL | 11 refills | Status: DC

## 2023-10-26 NOTE — Progress Notes (Signed)
 10/26/2023 9:48 PM   Paul Morrison 1956/03/07 [id number]  CC: Chief Complaint  Patient presents with   Urinary Incontinence   HPI: Paul Morrison is a 68 y.o. male with PMH ED on tadalafil, hypogonadism previously on TRT, and urinary retention after undergoing lumbar fusion in 2022 who presents today for evaluation of bothersome voiding symptoms.   Today he reports chronic LUTS since lumbar fusion that worsened after stopping Flomax.  He is primarily bothered by weak stream, frequency, and the sensation of incomplete bladder emptying.  He was previously on TRT, but stopped this due to nightmares.  He has responded reasonably well to PDE 5 inhibitors for his ED, though he did have headaches on sildenafil.  Overall, he tends to have bothersome side effects with medications so he is hesitant to add to his daily regimen.  PSA has been rather stable, most recently 1.82 on 07/03/2023.  IPSS 24/unhappy as below.  PVR 11 mL.  In-office UA today positive for 2+ protein; urine microscopy pan negative.   IPSS     Row Name 10/26/23 1500         International Prostate Symptom Score   How often have you had the sensation of not emptying your bladder? Almost always     How often have you had to urinate less than every two hours? Almost always     How often have you found you stopped and started again several times when you urinated? Less than half the time     How often have you found it difficult to postpone urination? Less than half the time     How often have you had a weak urinary stream? Almost always     How often have you had to strain to start urination? Less than half the time     How many times did you typically get up at night to urinate? 3 Times     Total IPSS Score 24       Quality of Life due to urinary symptoms   If you were to spend the rest of your life with your urinary condition just the way it is now how would you feel about that? Unhappy                PMH: Past Medical History:  Diagnosis Date   Arthritis    Cancer (HCC)    skin cancer left ear- MOHS   Hyperlipidemia    Hypertension    Low back pain     Surgical History: Past Surgical History:  Procedure Laterality Date   COLONOSCOPY     HERNIA REPAIR     INJECTION KNEE     both knees   keratatomy     knee arthroscopy     LUMBAR FUSION     MOHS SURGERY     POLYPECTOMY     TOTAL KNEE ARTHROPLASTY Right 11/24/2016   Procedure: RIGHT TOTAL KNEE ARTHROPLASTY;  Surgeon: Eugenia Mcalpine, MD;  Location: [facility];  Service: Orthopedics;  Laterality: Right;   VASECTOMY      Home Medications:  Allergies as of 10/26/2023       Reactions   Cephalexin Other (See Comments)   Stomach cramping    Celecoxib Rash        Medication List        Accurate as of October 26, 2023  9:48 PM. If you have any questions, ask your nurse or doctor.  STOP taking these medications    benzonatate 100 MG capsule Commonly known as: Lawyer   Testosterone 1.62 % Gel       TAKE these medications    allopurinol 300 MG tablet Commonly known as: ZYLOPRIM Take 1 tablet (300 mg total) by mouth daily.   atorvastatin 80 MG tablet Commonly known as: LIPITOR Take 1 tablet (80 mg total) by mouth daily.   colchicine 0.6 MG tablet Take one tablet twice daily as needed for gout flare   indomethacin 50 MG capsule Commonly known as: INDOCIN TAKE ONE CAPSULE THREE TIMES DAILY AS NEEDED   losartan 100 MG tablet Commonly known as: COZAAR Take 1 tablet (100 mg total) by mouth daily.   predniSONE 20 MG tablet Commonly known as: DELTASONE Take 2 tablets by mouth once daily for 6 days as needed for gout flare   tadalafil 20 MG tablet Commonly known as: CIALIS Take one tablet by mouth every other day as needed for erectile dysfunction What changed: Another medication with the same name was added. Make sure you understand how and when to take each.   tadalafil 5 MG  tablet Commonly known as: CIALIS Take 1 tablet (5 mg total) by mouth daily. What changed: You were already taking a medication with the same name, and this prescription was added. Make sure you understand how and when to take each.        Allergies:  Allergies  Allergen Reactions   Cephalexin Other (See Comments)    Stomach cramping    Celecoxib Rash    Family History: Family History  Problem Relation Age of Onset   Hyperlipidemia Mother    Hypertension Mother    Asthma Mother    Coronary artery disease Other    Aneurysm Other    Colon cancer Neg Hx    Colon polyps Neg Hx    Esophageal cancer Neg Hx    Rectal cancer Neg Hx    Stomach cancer Neg Hx     Social History:   reports that he has never smoked. He has never used smokeless tobacco. He reports current alcohol use. He reports that he does not use drugs.  Physical Exam: BP (!) 146/105   Pulse 91   Ht 6\' 2"  (1.88 m)   Wt 265 lb (120.2 kg)   BMI 34.02 kg/m   Constitutional:  Alert and oriented, no acute distress, nontoxic appearing HEENT: [place], AT Cardiovascular: No clubbing, cyanosis, or edema Respiratory: Normal respiratory effort, no increased work of breathing Skin: No rashes, bruises or suspicious lesions Neurologic: Grossly intact, no focal deficits, moving all 4 extremities Psychiatric: Normal mood and affect  Laboratory Data: Results for orders placed or performed in visit on 10/26/23  Microscopic Examination   Collection Time: 10/26/23  3:12 PM   Urine  Result Value Ref Range   WBC, UA 0-5 0 - 5 /hpf   RBC, Urine 0-2 0 - 2 /hpf   Epithelial Cells (non renal) 0-10 0 - 10 /hpf   Casts Present (A) None seen /lpf   Cast Type Hyaline casts N/A   Crystals Present (A) N/A   Crystal Type Amorphous Sediment N/A   Bacteria, UA Few None seen/Few  Urinalysis, Complete   Collection Time: 10/26/23  3:12 PM  Result Value Ref Range   Specific Gravity, UA 1.020 1.005 - 1.030   pH, UA 7.5 5.0 - 7.5   Color,  UA Yellow Yellow   Appearance Ur Clear Clear   Leukocytes,UA  Negative Negative   Protein,UA 2+ (A) Negative/Trace   Glucose, UA Negative Negative   Ketones, UA Negative Negative   RBC, UA Negative Negative   Bilirubin, UA Negative Negative   Urobilinogen, Ur 0.2 0.2 - 1.0 mg/dL   Nitrite, UA Negative Negative   Microscopic Examination See below:   BLADDER SCAN AMB NON-IMAGING   Collection Time: 10/26/23  3:12 PM  Result Value Ref Range   Scan Result 11    Assessment & Plan:   1. Lower urinary tract symptoms (LUTS) (Primary) Chronic LUTS with mixed obstructive and storage related symptoms most likely secondary to BPH.  He is emptying appropriately.  We discussed pharmacotherapy, however with his concerns related to medication side effects he would be open to pursuing bladder outlet workup for consideration of procedural intervention, which is reasonable.  Will schedule him for cystoscopy TRUS with Dr. Richardo Hanks.  I deferred DRE today in light of stable PSAs and upcoming TRUS.  We did discuss starting low-dose daily tadalafil to see if this will help him with his voiding symptoms and ED in the interim. - Urinalysis, Complete - BLADDER SCAN AMB NON-IMAGING - tadalafil (CIALIS) 5 MG tablet; Take 1 tablet (5 mg total) by mouth daily.  Dispense: 30 tablet; Refill: 11  2. Erectile dysfunction, unspecified erectile dysfunction type Starting low-dose daily tadalafil as above. - tadalafil (CIALIS) 5 MG tablet; Take 1 tablet (5 mg total) by mouth daily.  Dispense: 30 tablet; Refill: 11  3. Hypogonadism in male Could not tolerate TRT due to nightmares, okay to stay off this.  Return in about 4 weeks (around 11/23/2023) for Cysto/TRUS with Dr. Richardo Hanks.  Carman Ching, PA-C  [facility] [address], [address] [place], [place] [address] [phone]

## 2023-10-26 NOTE — Patient Instructions (Signed)

## 2023-11-05 ENCOUNTER — Ambulatory Visit: Payer: Self-pay

## 2023-11-05 NOTE — Telephone Encounter (Signed)
  Chief Complaint: Calf Pain Symptoms: localized pain to right calf Frequency: started last friday Pertinent Negatives: Patient denies CP, SOB Disposition: [] ED /[] Urgent Care (no appt availability in office) / [x] Appointment(In office/virtual)/ []  [facility]/ [] Home Care/ [] Refused Recommended Disposition /[] [facility]/ []  Follow-up with PCP Additional Notes: patient was transferred from office line to nurse triage. Patient endorses right calf pain that started last Friday. Patient endorses severe calf pain after getting out of his car. Patient states he had difficulty walking around his office. Patient states pain decreased by the end of the day. Patient was able to go to the gym and do a sufficient workout. Patient states the calf is still sore today-endorses a small area of discomfort to the right calf. Current pain 2 out of 10. Appointment was made with PCP for tomorrow prior transfer to Nurse Triage. Per protocol, patient is recommended to be seen within 24 hours so his appointment tomorrow is appropriate. Patient verbalized understanding of plan and all questions answered.     Reason for Disposition  Localized pain, redness or hard lump along vein  Answer Assessment - Initial Assessment Questions 1. ONSET: "When did the pain start?"      Started this past Friday morning 2. LOCATION: "Where is the pain located?"      Right Leg-calf area 3. PAIN: "How bad is the pain?"    (Scale 1-10; or mild, moderate, severe)   -  MILD (1-3): doesn't interfere with normal activities    -  MODERATE (4-7): interferes with normal activities (e.g., work or school) or awakens from sleep, limping    -  SEVERE (8-10): excruciating pain, unable to do any normal activities, unable to walk     Patient's pain is currently a 2/10-pain was a 10 on Friday 4. WORK OR EXERCISE: "Has there been any recent work or exercise that involved this part of the body?"      Pain happened out of  nowhere 5. CAUSE: "What do you think is causing the leg pain?"     Unsure-patient is concerned for possible blood clot 6. OTHER SYMPTOMS: "Do you have any other symptoms?" (e.g., chest pain, back pain, breathing difficulty, swelling, rash, fever, numbness, weakness)     no  Protocols used: Leg Pain-A-AH

## 2023-11-06 ENCOUNTER — Ambulatory Visit (INDEPENDENT_AMBULATORY_CARE_PROVIDER_SITE_OTHER): Admitting: Family Medicine

## 2023-11-06 ENCOUNTER — Encounter: Payer: Self-pay | Admitting: Family Medicine

## 2023-11-06 VITALS — BP 124/80 | HR 54 | Temp 98.1°F | Ht 74.0 in | Wt 262.7 lb

## 2023-11-06 DIAGNOSIS — S86111A Strain of other muscle(s) and tendon(s) of posterior muscle group at lower leg level, right leg, initial encounter: Secondary | ICD-10-CM | POA: Diagnosis not present

## 2023-11-06 DIAGNOSIS — F5104 Psychophysiologic insomnia: Secondary | ICD-10-CM

## 2023-11-06 DIAGNOSIS — I10 Essential (primary) hypertension: Secondary | ICD-10-CM

## 2023-11-06 MED ORDER — TEMAZEPAM 15 MG PO CAPS: 15.0000 mg | ORAL_CAPSULE | Freq: Every evening | ORAL | 0 refills | Status: DC | PRN

## 2023-11-06 NOTE — Progress Notes (Signed)
 Established Patient Office Visit  Subjective   Patient ID: Paul Morrison, male    DOB: 1956-03-09  Age: 68 y.o. MRN: [id number]  Chief Complaint  Patient presents with   Leg Pain    Patient came in today for calf pain, started 4 days ago, patient rates the pain a 0 out of 10, Stabbing pain     HPI   Paul Morrison is seen for the following items  Right calf pain.  First noted Friday.  He drove about an hour and we went to get out of the car he put his foot down and applied some pressure with walking and felt a sudden sharp pain in his calf near the lower gastrocnemius muscle region.  He had some pain that day but did eventually improve some later in the day.  He took indomethacin the next morning was at the gym and had a productive workout without any difficulty.  Later in the day has noticed some soreness and soreness since then.  No visible bruising.  No obvious swelling.  No history of DVT.  Has not noted any ankle or lower leg edema whatsoever.  Other issue is chronic insomnia.  He has difficulty falling asleep.  Sometimes in bed for hours before he can fall asleep.  Did not do well with trazodone.  He is taken things like Benadryl but these make him "hyper ".  Would like to explore other options.  Avoids late the use of caffeine.  Hypertension treated with losartan 100 mg daily.  Compliant with therapy.  Past Medical History:  Diagnosis Date   Arthritis    Cancer (HCC)    skin cancer left ear- MOHS   Hyperlipidemia    Hypertension    Low back pain    Past Surgical History:  Procedure Laterality Date   COLONOSCOPY     HERNIA REPAIR     INJECTION KNEE     both knees   keratatomy     knee arthroscopy     LUMBAR FUSION     MOHS SURGERY     POLYPECTOMY     TOTAL KNEE ARTHROPLASTY Right 11/24/2016   Procedure: RIGHT TOTAL KNEE ARTHROPLASTY;  Surgeon: Eugenia Mcalpine, MD;  Location: [facility];  Service: Orthopedics;  Laterality: Right;   VASECTOMY      reports that he has never  smoked. He has never used smokeless tobacco. He reports current alcohol use. He reports that he does not use drugs. family history includes Aneurysm in an other family member; Asthma in his mother; Coronary artery disease in an other family member; Hyperlipidemia in his mother; Hypertension in his mother. Allergies  Allergen Reactions   Cephalexin Other (See Comments)    Stomach cramping    Celecoxib Rash    Review of Systems  Constitutional:  Negative for chills, fever and malaise/fatigue.  Eyes:  Negative for blurred vision.  Respiratory:  Negative for cough, hemoptysis and shortness of breath.   Cardiovascular:  Negative for chest pain and leg swelling.  Neurological:  Negative for dizziness, weakness and headaches.      Objective:     BP 124/80 (BP Location: Left Arm, Cuff Size: Normal)   Pulse (!) 54   Temp 98.1 F (36.7 C) (Oral)   Ht 6\' 2"  (1.88 m)   Wt 262 lb 11.2 oz (119.2 kg)   SpO2 94%   BMI 33.73 kg/m  BP Readings from Last 3 Encounters:  11/06/23 124/80  10/26/23 (!) 146/105  07/10/23 Marland Kitchen)  140/80   Wt Readings from Last 3 Encounters:  11/06/23 262 lb 11.2 oz (119.2 kg)  10/26/23 265 lb (120.2 kg)  07/10/23 264 lb 9.6 oz (120 kg)      Physical Exam Vitals reviewed.  Constitutional:      General: He is not in acute distress.    Appearance: He is well-developed. He is not ill-appearing.  HENT:     Right Ear: External ear normal.     Left Ear: External ear normal.  Eyes:     Pupils: Pupils are equal, round, and reactive to light.  Neck:     Thyroid: No thyromegaly.  Cardiovascular:     Rate and Rhythm: Normal rate and regular rhythm.  Pulmonary:     Effort: Pulmonary effort is normal. No respiratory distress.     Breath sounds: Normal breath sounds. No wheezing or rales.  Musculoskeletal:     Cervical back: Neck supple.     Right lower leg: No edema.     Left lower leg: No edema.     Comments: Right calf reveals no obvious ecchymosis.  No visible  swelling.  No erythema.  Right lower extremity reveals no edema at the ankle or lower leg region.  Very mild tenderness to palpation medial head gastrocnemius distally where the muscle tapers No pain with plantarflexion or dorsiflexion against resistance.  Distal foot pulses are normal.  Neurological:     Mental Status: He is alert and oriented to person, place, and time.      No results found for any visits on 11/06/23.    The 10-year ASCVD risk score (Arnett DK, et al., 2019) is: 16.2%    Assessment & Plan:   #1 probable gastrocnemius muscle strain right calf.  No evidence clinically to suggest DVT.  Recommend some general stretches and strengthening exercises and observe for now.  This should gradually improve with time  #2 chronic insomnia.  Has not responded well to trazodone in the past.  Discussed sleep hygiene at some length.  Continue to avoid late the use of caffeine and bright lights at night.  We agreed to short-term trial of Restoril 15 mg to use 1 nightly with try to avoid regular use.  #3 hypertension.  Initial reading was up but repeat after rest improved and well-controlled.  Continue losartan 100 mg daily   [name], MD

## 2023-11-07 ENCOUNTER — Ambulatory Visit: Admitting: Family Medicine

## 2023-12-07 ENCOUNTER — Other Ambulatory Visit: Payer: Self-pay | Admitting: Family Medicine

## 2023-12-07 ENCOUNTER — Encounter: Payer: Self-pay | Admitting: Family Medicine

## 2023-12-07 MED ORDER — TEMAZEPAM 15 MG PO CAPS: 15.0000 mg | ORAL_CAPSULE | Freq: Every evening | ORAL | 1 refills | Status: DC | PRN

## 2023-12-13 ENCOUNTER — Ambulatory Visit (INDEPENDENT_AMBULATORY_CARE_PROVIDER_SITE_OTHER): Admitting: Urology

## 2023-12-13 VITALS — BP 118/76 | HR 90 | Ht 74.0 in | Wt 262.0 lb

## 2023-12-13 DIAGNOSIS — R399 Unspecified symptoms and signs involving the genitourinary system: Secondary | ICD-10-CM | POA: Diagnosis not present

## 2023-12-13 DIAGNOSIS — N529 Male erectile dysfunction, unspecified: Secondary | ICD-10-CM

## 2023-12-13 MED ORDER — TADALAFIL 5 MG PO TABS: 10.0000 mg | ORAL_TABLET | Freq: Every day | ORAL | 11 refills | Status: DC

## 2023-12-13 MED ORDER — LIDOCAINE HCL URETHRAL/MUCOSAL 2 % EX GEL
1.0000 | Freq: Once | CUTANEOUS | Status: AC
  Administered 2023-12-13: 1 via URETHRAL

## 2023-12-13 NOTE — Progress Notes (Signed)
 Cystoscopy Procedure Note:  Indication: Urinary symptoms  Primary urinary complaints are nocturia x 1, urgency, frequency, sensation of incomplete emptying during the day despite normal PVRs.  Has had improvement on daily Cialis  5 mg  After informed consent and discussion of the procedure and its risks, SHEENA WURM was positioned and prepped in the standard fashion. Cystoscopy was performed with a flexible cystoscope. The urethra, bladder neck and entire bladder was visualized in a standard fashion. The prostate was moderate in size. The ureteral orifices were visualized in their normal location and orientation.  Bladder mucosa grossly normal, no abnormalities on retroflexion, no median lobe  Imaging: Ultrasound probe inserted into the rectum for a calculated prostate volume of 45 g  -------------------------------------------------------  Findings: Moderate size prostate, no suspicious lesions  Assessment and Plan: 68 year old male with bothersome urinary symptoms with frequency, sensation of incomplete emptying, nocturia x 1 after back surgery a few years ago, he did require catheter postoperatively at that time.  Symptoms currently relatively well-controlled on Cialis  5 mg daily.  We discussed options including increasing the Cialis , resuming Flomax  he was on previously, or surgical options like UroLift or HOLEP.  Risk and benefits were discussed extensively.  He is not bothered enough to consider surgical options at this time, but was interested in increasing the Cialis  dose.  Cialis  increased to 10 mg daily RTC 4 months PVR and symptom check  Jay Meth, MD 12/13/2023

## 2024-01-30 ENCOUNTER — Other Ambulatory Visit: Payer: Self-pay | Admitting: Family Medicine

## 2024-01-31 ENCOUNTER — Other Ambulatory Visit (HOSPITAL_COMMUNITY): Payer: Self-pay

## 2024-01-31 ENCOUNTER — Telehealth: Payer: Self-pay

## 2024-01-31 NOTE — Telephone Encounter (Signed)
 Pharmacy Patient Advocate Encounter   Received notification from CoverMyMeds that prior authorization for Temazepam  15MG  capsules  is required/requested.   Insurance verification completed.   The patient is insured through [facility] .   Per test claim: PA required; PA started via CoverMyMeds. KEY [id number] . Waiting for clinical questions to populate.

## 2024-02-04 NOTE — Telephone Encounter (Signed)
 Pharmacy Patient Advocate Encounter   Per test claim: PA required; PA submitted to above mentioned insurance via CoverMyMeds Key/confirmation #/EOC [id number] Status is pending

## 2024-02-05 ENCOUNTER — Other Ambulatory Visit (HOSPITAL_COMMUNITY): Payer: Self-pay

## 2024-02-05 NOTE — Telephone Encounter (Signed)
 Pharmacy Patient Advocate Encounter  Received notification from [facility] that Prior Authorization for Temazepam  15MG  capsules  has been DENIED.  See denial reason below. No denial letter attached in CMM. Will attach denial letter to Media tab once received. PA #/Case ID/Reference #: [id number] Your plan DENIED coverage of this medication based on your prescriber answering No to the following question(s): Has the patient experienced an inadequate treatment response, intolerance, or does the patient have a contraindication to doxepin (3mg  or 6mg )? (Nothing in chart showing pt has tried or failed the above medication.) PLEASE BE ADVISED TEST CLAIM:  LAST FILL [id number] @[facility]# [phone](PHARMACY HELP DESK [id number]) For RxLocal Coupon Price of: $12.68 submit to BIN: [id number] PCN: [id number] Group: [id number]

## 2024-02-18 ENCOUNTER — Telehealth: Payer: Self-pay

## 2024-02-18 ENCOUNTER — Other Ambulatory Visit (HOSPITAL_COMMUNITY): Payer: Self-pay

## 2024-02-18 MED ORDER — DOXEPIN HCL 10 MG PO CAPS: 10.0000 mg | ORAL_CAPSULE | Freq: Every evening | ORAL | 0 refills | Status: DC | PRN

## 2024-02-18 NOTE — Addendum Note (Signed)
 Addended by: METTA KRISTEN CROME on: 02/18/2024 08:03 AM   Modules accepted: Orders

## 2024-02-18 NOTE — Telephone Encounter (Signed)
 Pharmacy Patient Advocate Encounter   Received notification from CoverMyMeds that prior authorization for Doxepin  HCl 10MG  capsules is required/requested.   Insurance verification completed.   The patient is insured through [facility] .   PLEASE BE ADVISED ACTION: DOXEPIN  HCL 10 MG  CAPSULES IS NOT COVER  PLAN STATES THEY WILL COVER  DOXEPIN  (3mg  or 6mg )  SEE PREVIOUS ENCOUNTER 01/31/2024 (TEMAZEPAM )  TEST BILLING SHOW 30 DAY SUPPLY OR 3 MG OR 6MG   ONE TABLET ONCE DAILY IS COVER COPAY OF $12.00

## 2024-02-19 NOTE — Telephone Encounter (Signed)
 Patient informed of the message below and voiced understanding

## 2024-02-28 ENCOUNTER — Telehealth: Payer: Self-pay

## 2024-02-28 ENCOUNTER — Other Ambulatory Visit (HOSPITAL_COMMUNITY): Payer: Self-pay

## 2024-02-28 NOTE — Telephone Encounter (Signed)
 Pharmacy Patient Advocate Encounter   Received notification from Pt Calls Messages that prior authorization for Temazepam  15 caps is required/requested.   Insurance verification completed.   The patient is insured through [employer] .     Per test claim: Refill too soon. PA is not needed at this time. Medication was filled 01/02/24. Next eligible fill date is 11/05/24.

## 2024-03-11 DIAGNOSIS — H903 Sensorineural hearing loss, bilateral: Secondary | ICD-10-CM | POA: Diagnosis not present

## 2024-03-11 DIAGNOSIS — Z461 Encounter for fitting and adjustment of hearing aid: Secondary | ICD-10-CM | POA: Diagnosis not present

## 2024-04-17 ENCOUNTER — Ambulatory Visit: Admitting: Urology

## 2024-05-12 ENCOUNTER — Other Ambulatory Visit: Payer: Self-pay | Admitting: Family Medicine

## 2024-05-27 ENCOUNTER — Other Ambulatory Visit: Payer: Self-pay | Admitting: Family Medicine

## 2024-05-27 DIAGNOSIS — L821 Other seborrheic keratosis: Secondary | ICD-10-CM | POA: Diagnosis not present

## 2024-05-27 DIAGNOSIS — L570 Actinic keratosis: Secondary | ICD-10-CM | POA: Diagnosis not present

## 2024-05-27 DIAGNOSIS — D492 Neoplasm of unspecified behavior of bone, soft tissue, and skin: Secondary | ICD-10-CM | POA: Diagnosis not present

## 2024-05-27 DIAGNOSIS — L814 Other melanin hyperpigmentation: Secondary | ICD-10-CM | POA: Diagnosis not present

## 2024-05-27 DIAGNOSIS — D225 Melanocytic nevi of trunk: Secondary | ICD-10-CM | POA: Diagnosis not present

## 2024-06-05 ENCOUNTER — Encounter: Payer: Self-pay | Admitting: Family Medicine

## 2024-06-05 DIAGNOSIS — E669 Obesity, unspecified: Secondary | ICD-10-CM | POA: Diagnosis not present

## 2024-06-05 DIAGNOSIS — Z6832 Body mass index (BMI) 32.0-32.9, adult: Secondary | ICD-10-CM | POA: Diagnosis not present

## 2024-06-05 DIAGNOSIS — G4733 Obstructive sleep apnea (adult) (pediatric): Secondary | ICD-10-CM | POA: Diagnosis not present

## 2024-06-05 DIAGNOSIS — Z23 Encounter for immunization: Secondary | ICD-10-CM | POA: Diagnosis not present

## 2024-06-05 DIAGNOSIS — G2581 Restless legs syndrome: Secondary | ICD-10-CM | POA: Diagnosis not present

## 2024-06-05 DIAGNOSIS — Z79899 Other long term (current) drug therapy: Secondary | ICD-10-CM | POA: Diagnosis not present

## 2024-06-10 ENCOUNTER — Telehealth: Payer: Self-pay | Admitting: Family Medicine

## 2024-06-10 NOTE — Telephone Encounter (Signed)
 Copied from CRM [id number]. Topic: General - Other >> Jun 10, 2024 12:08 PM Drema MATSU wrote: Reason for CRM: Patient is returning a call from [facility]. Please call patient back.

## 2024-06-10 NOTE — Telephone Encounter (Signed)
 Left message for the patient to return my call.

## 2024-06-11 ENCOUNTER — Encounter: Payer: Self-pay | Admitting: Family Medicine

## 2024-06-11 ENCOUNTER — Ambulatory Visit: Admitting: Family Medicine

## 2024-06-11 VITALS — BP 120/64 | HR 83 | Temp 97.5°F | Wt 272.7 lb

## 2024-06-11 DIAGNOSIS — E782 Mixed hyperlipidemia: Secondary | ICD-10-CM

## 2024-06-11 DIAGNOSIS — R7303 Prediabetes: Secondary | ICD-10-CM

## 2024-06-11 DIAGNOSIS — E66811 Obesity, class 1: Secondary | ICD-10-CM

## 2024-06-11 DIAGNOSIS — I10 Essential (primary) hypertension: Secondary | ICD-10-CM

## 2024-06-11 DIAGNOSIS — Z125 Encounter for screening for malignant neoplasm of prostate: Secondary | ICD-10-CM

## 2024-06-11 DIAGNOSIS — Z23 Encounter for immunization: Secondary | ICD-10-CM | POA: Diagnosis not present

## 2024-06-11 DIAGNOSIS — G4730 Sleep apnea, unspecified: Secondary | ICD-10-CM

## 2024-06-11 NOTE — Telephone Encounter (Signed)
 Appt scheduled

## 2024-06-11 NOTE — Telephone Encounter (Signed)
 Left message for the patient to return my call.

## 2024-06-11 NOTE — Progress Notes (Signed)
 Established Patient Office Visit  Subjective   Patient ID: ENGELBERT SEVIN, male    DOB: [birth year]  Age: 68 y.o. MRN: [id number]  Chief Complaint  Patient presents with   Medication Consultation    HPI   Mr Paul Morrison is seen for medical follow-up.  Went to [place] with wife earlier this year.  She unfortunately had back fracture not long after he got back from skiing injury.  He has been very busy taking care of her.  He has chronic medical problems including history of obstructive sleep apnea, hypertension, gout, hyperlipidemia, obesity.  He has been frustrated with difficulties losing weight.  Is been on low calorie diets and low carbohydrate diets in the past without much success.  Has also tried exercising regularly.  He tried previously to get on GLP-1 medication but this was denied.  He does have prediabetes.  Other comorbidities as above.  He denies any history of pancreatitis or any family history of multiple endocrine neoplasia or medullary thyroid  cancer.  He takes losartan  for hypertension and blood pressure control.  He is on atorvastatin  for hyperlipidemia.  Denies any myalgias.  Due for follow-up labs soon.  Immunizations reviewed.  Up-to-date with exception of no history of documented pneumonia vaccine.  He states he received flu vaccine about a month ago at pharmacy.  Past Medical History:  Diagnosis Date   Arthritis    Cancer (HCC)    skin cancer left ear- MOHS   Hyperlipidemia    Hypertension    Low back pain    Past Surgical History:  Procedure Laterality Date   COLONOSCOPY     HERNIA REPAIR     INJECTION KNEE     both knees   keratatomy     knee arthroscopy     LUMBAR FUSION     MOHS SURGERY     POLYPECTOMY     TOTAL KNEE ARTHROPLASTY Right 11/24/2016   Procedure: RIGHT TOTAL KNEE ARTHROPLASTY;  Surgeon: Lamar Collet, MD;  Location: [facility];  Service: Orthopedics;  Laterality: Right;   VASECTOMY      reports that he has never smoked. He has never  used smokeless tobacco. He reports current alcohol use. He reports that he does not use drugs. family history includes Aneurysm in an other family member; Asthma in his mother; Coronary artery disease in an other family member; Hyperlipidemia in his mother; Hypertension in his mother. Allergies  Allergen Reactions   Cephalexin  Other (See Comments)    Stomach cramping    Celecoxib Rash    Review of Systems  Constitutional:  Negative for chills and fever.  Eyes:  Negative for blurred vision.  Respiratory:  Negative for shortness of breath.   Cardiovascular:  Negative for chest pain.  Neurological:  Negative for dizziness, weakness and headaches.      Objective:     BP 120/64   Pulse 83   Temp (!) 97.5 F (36.4 C) (Oral)   Wt 272 lb 11.2 oz (123.7 kg)   SpO2 95%   BMI 35.01 kg/m  BP Readings from Last 3 Encounters:  06/11/24 120/64  12/13/23 118/76  11/06/23 124/80   Wt Readings from Last 3 Encounters:  06/11/24 272 lb 11.2 oz (123.7 kg)  12/13/23 262 lb (118.8 kg)  11/06/23 262 lb 11.2 oz (119.2 kg)      Physical Exam Vitals reviewed.  Constitutional:      General: He is not in acute distress.    Appearance: He is  not ill-appearing.  Cardiovascular:     Rate and Rhythm: Normal rate and regular rhythm.  Pulmonary:     Effort: Pulmonary effort is normal.     Breath sounds: Normal breath sounds. No wheezing or rales.  Musculoskeletal:     Right lower leg: No edema.     Left lower leg: No edema.  Neurological:     Mental Status: He is alert.      No results found for any visits on 06/11/24.    The 10-year ASCVD risk score (Arnett DK, et al., 2019) is: 16.5%    Assessment & Plan:   Problem List Items Addressed This Visit       Unprioritized   Obesity (BMI 30.0-34.9)   Hyperlipidemia - Primary   Relevant Orders   Lipid panel   CMP   Other Visit Diagnoses       Prediabetes       Relevant Orders   Hemoglobin A1c     Prostate cancer screening        Relevant Orders   PSA, Medicare     Patient has history of obesity with multiple comorbidities as above including hypertension, hyperlipidemia, prediabetes, obstructive sleep apnea.  He struggled to lose weight on his own with low calorie diets and exercise.  He specifically has interest in GLP-1 medications and because of lack of insurance coverage previously specifically had mentioned direct to consumer programs like [employer] direct.  Will set up consultation with our clinical pharmacist to discuss.  He has no known contraindications.  -Ordered future labs as above which she will set up for early December - Discussed pneumonia vaccine.  He has no history of documented pneumonia vaccine and was given 20 valent vaccine today.  Flu vaccine already given  No follow-ups on file.    Wolm Scarlet, MD

## 2024-06-11 NOTE — Patient Instructions (Signed)
 Go for labs at [facility] clinic early December  I will be setting up referral to our clinical pharmacist to discuss possible GLP-1 medications.

## 2024-06-13 ENCOUNTER — Telehealth: Payer: Self-pay | Admitting: *Deleted

## 2024-06-13 NOTE — Progress Notes (Signed)
 Care Guide Pharmacy Note  06/13/2024 Name: Paul Morrison MRN: [id number] DOB: 07-20-1956  Referred By: Micheal Wolm ORN, MD Reason for referral: Call Attempt #1 and Complex Care Management (Outreach to schedule referral with pharmacist )   Paul Morrison is a 68 y.o. year old male who is a primary care patient of Burchette, Wolm ORN, MD.  Paul Morrison was referred to the pharmacist for assistance related to: GLP-1  An unsuccessful telephone outreach was attempted today to contact the patient who was referred to the pharmacy team for assistance with medication assistance. Additional attempts will be made to contact the patient.  Paul Morrison, Paul Morrison Paul Morrison  [facility], [facility] Guide Direct Dial: [phone]  Fax: [phone] Website: [email or web address]

## 2024-06-13 NOTE — Progress Notes (Signed)
 Care Guide Pharmacy Note  06/13/2024 Name: GRACEN SOUTHWELL MRN: [id number] DOB: Feb 04, 1956  Referred By: Micheal Wolm ORN, MD Reason for referral: Call Attempt #1 and Complex Care Management (Outreach to schedule referral with pharmacist )   Marsa GORMAN Cera is a 68 y.o. year old male who is a primary care patient of Micheal Wolm ORN, MD.  Marsa GORMAN Cera was referred to the pharmacist for assistance related to: GLP-1   Successful contact was made with the patient to discuss pharmacy services including being ready for the pharmacist to call at least 5 minutes before the scheduled appointment time and to have medication bottles and any blood pressure readings ready for review. The patient agreed to meet with the pharmacist via telephone visit on 06/25/2024  Thedford Franks, CMA [place]  [facility], [facility] Guide Direct Dial: [phone]  Fax: [phone] Website: [email or web address]

## 2024-06-25 ENCOUNTER — Other Ambulatory Visit

## 2024-06-25 DIAGNOSIS — E66811 Obesity, class 1: Secondary | ICD-10-CM

## 2024-06-25 MED ORDER — TIRZEPATIDE-WEIGHT MANAGEMENT 2.5 MG/0.5ML ~~LOC~~ SOLN: 2.5000 mg | SUBCUTANEOUS | 0 refills | Status: DC

## 2024-06-25 NOTE — Progress Notes (Signed)
   06/25/2024  Patient ID: Paul Morrison, male   DOB: 1956/02/08, 68 y.o.   MRN: [id number]  Contacted patient via telephone at referral of PCP to discuss access to GLP-1 self pay programs for weight loss.  Reviewed the self pay options for Wegovy  and Zepbound . Reviewed aspects and costs of both drugs.   Patient would like to enroll in the Zepbound  self pay program. Sending in starter dose for PCP co-sign at 2.5mg .  Discussed importance of healthy eating for success in weight loss and also to limit side effects.  Follow Up: 3 weeks but patient aware to reach out sooner with questions/concerns  Jon VEAR Lindau, PharmD Clinical Pharmacist [phone]

## 2024-07-02 ENCOUNTER — Other Ambulatory Visit: Payer: Self-pay | Admitting: Family Medicine

## 2024-07-02 ENCOUNTER — Encounter: Payer: Self-pay | Admitting: Family Medicine

## 2024-07-03 ENCOUNTER — Ambulatory Visit: Admitting: Family Medicine

## 2024-07-03 ENCOUNTER — Encounter: Payer: Self-pay | Admitting: Family Medicine

## 2024-07-03 DIAGNOSIS — Z0000 Encounter for general adult medical examination without abnormal findings: Secondary | ICD-10-CM | POA: Diagnosis not present

## 2024-07-03 DIAGNOSIS — E611 Iron deficiency: Secondary | ICD-10-CM

## 2024-07-03 NOTE — Progress Notes (Signed)
 ----------------------------------------------------------------------------------------------------------------------------------------------------------------------------------------------------------------------  Because this visit was a virtual/telehealth visit, some criteria may be missing or patient reported. Any vitals not documented were not able to be obtained and vitals that have been documented are patient reported.    MEDICARE ANNUAL PREVENTIVE CARE VISIT WITH PROVIDER (Welcome to Medicare, initial annual wellness or annual wellness exam)  Virtual Visit via Video Note  I connected with Paul Morrison on 07/03/24  by  a video enabled telemedicine application and verified that I am speaking with the correct person using two identifiers.  Location patient: home Location provider:work or home office Persons participating in the virtual visit: patient, provider  Concerns and/or follow up today: detailed intake and health/risks assessment completed on flow sheets and below- please see for details. Nothing new.  How often do you have a drink containing alcohol?daily How many drinks containing alcohol do you have on a typical day when you are drinking? 1 drink How often do you have six or more drinks on one occasion?na Have you ever smoked?n Quit date if applicable? na  How many packs a day do/did you smoke? na Do you use smokeless tobacco?na Do you use an illicit drugs?n Do you feel safe at home?y Last dentist visit? Dentist retired, has plans to get in to see a new dentist Last eye Exam and location?[employer], goes on a regular basis   See HM section in Epic for other details of completed HM.    ROS: negative for report of fevers, unintentional weight loss, vision changes, vision loss, hearing loss or change, chest pain, sob, hemoptysis, melena, hematochezia, hematuria,bleeding or bruising  Patient-completed extensive health risk assessment - reviewed and discussed  with the patient: See Health Risk Assessment completed with patient prior to the visit either above or in recent phone note. This was reviewed in detailed with the patient today and appropriate recommendations, orders and referrals were placed as needed per Summary below and patient instructions.   Review of Medical History: -PMH, PSH, Family History and current specialty and care providers reviewed and updated and listed below   Patient Care Team: Micheal Wolm ORN, MD as PCP - General (Family Medicine)   Past Medical History:  Diagnosis Date   Arthritis    Cancer [facility])    skin cancer left ear- MOHS   Hyperlipidemia    Hypertension    Low back pain     Past Surgical History:  Procedure Laterality Date   COLONOSCOPY     HERNIA REPAIR     INJECTION KNEE     both knees   keratatomy     knee arthroscopy     LUMBAR FUSION     MOHS SURGERY     POLYPECTOMY     TOTAL KNEE ARTHROPLASTY Right 11/24/2016   Procedure: RIGHT TOTAL KNEE ARTHROPLASTY;  Surgeon: Lamar Collet, MD;  Location: [facility];  Service: Orthopedics;  Laterality: Right;   VASECTOMY      Social History   Socioeconomic History   Marital status: Married    Spouse name: Not on file   Number of children: Not on file   Years of education: Not on file   Highest education level: 12th grade  Occupational History   Not on file  Tobacco Use   Smoking status: Never   Smokeless tobacco: Never  Vaping Use   Vaping status: Never Used  Substance and Sexual Activity   Alcohol use: Yes    Comment: Occas   Drug use: No   Sexual  activity: Yes  Other Topics Concern   Not on file  Social History Narrative   Caffiene 2 cups in am   Work; 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   Social Drivers of Corporate Investment Banker Strain: Low Risk  (07/03/2024)   Overall Financial Resource Strain (CARDIA)    Difficulty of Paying Living Expenses: Not hard at all  Food Insecurity: No Food Insecurity  (07/03/2024)   Hunger Vital Sign    Worried About Running Out of Food in the Last Year: Never true    Ran Out of Food in the Last Year: Never true  Transportation Needs: No Transportation Needs (07/03/2024)   PRAPARE - Administrator, Civil Service (Medical): No    Lack of Transportation (Non-Medical): No  Physical Activity: Sufficiently Active (07/03/2024)   Exercise Vital Sign    Days of Exercise per Week: 3 days    Minutes of Exercise per Session: 90 min  Stress: No Stress Concern Present (07/03/2024)   [employer] of Occupational Health - Occupational Stress Questionnaire    Feeling of Stress: Only a little  Social Connections: Socially Integrated (07/03/2024)   Social Connection and Isolation Panel    Frequency of Communication with Friends and Family: Three times a week    Frequency of Social Gatherings with Friends and Family: Once a week    Attends Religious Services: 1 to 4 times per year    Active Member of [employer] or Organizations: Yes    Attends Banker Meetings: 1 to 4 times per year    Marital Status: Married  Catering Manager Violence: Not on file    Family History  Problem Relation Age of Onset   Hyperlipidemia Mother    Hypertension Mother    Asthma Mother    Coronary artery disease Other    Aneurysm Other    Colon cancer Neg Hx    Colon polyps Neg Hx    Esophageal cancer Neg Hx    Rectal cancer Neg Hx    Stomach cancer Neg Hx     Current Outpatient Medications on File Prior to Visit  Medication Sig Dispense Refill   allopurinol  (ZYLOPRIM ) 300 MG tablet Take 1 tablet (300 mg total) by mouth daily. 90 tablet 3   atorvastatin  (LIPITOR) 80 MG tablet Take 1 tablet (80 mg total) by mouth daily. 90 tablet 3   losartan  (COZAAR ) 100 MG tablet Take 1 tablet (100 mg total) by mouth daily. 90 tablet 3   tadalafil  (CIALIS ) 5 MG tablet Take 2 tablets (10 mg total) by mouth daily. 30 tablet 11   temazepam  (RESTORIL ) 15 MG capsule TAKE ONE  CAPSULE BY MOUTH at bedtime AS NEEDED FOR SLEEP 30 capsule 2   tirzepatide  (ZEPBOUND ) 2.5 MG/0.5ML injection vial Inject 2.5 mg into the skin once a week. 2 mL 0   colchicine  0.6 MG tablet Take one tablet twice daily as needed for gout flare 60 tablet 3   indomethacin  (INDOCIN ) 50 MG capsule TAKE ONE CAPSULE THREE TIMES DAILY AS NEEDED 30 capsule 1   No current facility-administered medications on file prior to visit.    Allergies  Allergen Reactions   Cephalexin  Other (See Comments)    Stomach cramping    Celecoxib Rash       Physical Exam Vitals requested from patient and listed below if patient had equipment and was able to obtain at home for this virtual visit: There were no vitals filed for this  visit. Estimated body mass index is 35.01 kg/m as calculated from the following:   Height as of 12/13/23: 6' 2 (1.88 m).   Weight as of 06/11/24: 272 lb 11.2 oz (123.7 kg).  EKG (optional): deferred due to virtual visit  GENERAL: alert, oriented, no acute distress detected; full vision exam deferred due to pandemic and/or virtual encounter   HEENT: atraumatic, conjunttiva clear, no obvious abnormalities on inspection of external nose and ears  NECK: normal movements of the head and neck  LUNGS: on inspection no signs of respiratory distress, breathing rate appears normal, no obvious gross SOB, gasping or wheezing  CV: no obvious cyanosis  MS: moves all visible extremities without noticeable abnormality  PSYCH/NEURO: pleasant and cooperative, no obvious depression or anxiety, speech and thought processing grossly intact, Cognitive function grossly intact  Flowsheet Row Office Visit from 11/06/2023 in [facility] [place]  PHQ-9 Total Score 2        07/03/2024   12:38 PM 11/06/2023    2:23 PM 08/17/2023    3:29 PM 08/10/2022   10:16 AM 10/28/2021    9:54 AM  Depression screen PHQ 2/9  Decreased Interest 0 0 0 0 0  Down, Depressed, Hopeless 0 0 0 0 0   PHQ - 2 Score 0 0 0 0 0  Altered sleeping  2     Tired, decreased energy  0     Change in appetite  0     Feeling bad or failure about yourself   0     Trouble concentrating  0     Moving slowly or fidgety/restless  0     Suicidal thoughts  0     PHQ-9 Score  2      Difficult doing work/chores  Somewhat difficult        Data saved with a previous flowsheet row definition       02/24/2022    2:43 PM 06/26/2023    3:32 PM 11/06/2023    2:23 PM 07/03/2024    9:43 AM 07/03/2024   12:12 PM  Fall Risk  Falls in the past year? 0 0 0 0   Was there an injury with Fall?   0   0  Fall Risk Category Calculator   0    Patient at Risk for Falls Due to     No Fall Risks  Fall risk Follow up   Falls evaluation completed  Falls evaluation completed     Data saved with a previous flowsheet row definition     SUMMARY AND PLAN:  Encounter for Medicare annual wellness exam   Discussed applicable health maintenance/preventive health measures and advised and referred or ordered per patient preferences: -he reports he had his flu and covid vaccines at the pharmacy last month and agrees to provide details so that we can update his chart Health Maintenance  Topic Date Due   Influenza Vaccine  02/29/2024   COVID-19 Vaccine (9 - Pfizer risk 2025-26 season) 11/03/2024   Medicare Annual Wellness (AWV)  07/03/2025   Colonoscopy  09/22/2025   DTaP/Tdap/Td (3 - Td or Tdap) 12/27/2026   Pneumococcal Vaccine: 50+ Years  Completed   Hepatitis C Screening  Completed   Zoster Vaccines- Shingrix   Completed   Meningococcal B Vaccine  Aged Out     Education and counseling on the following was provided based on the above review of health and a plan/checklist for the patient, along with additional information discussed, was provided  for the patient in the patient instructions :  -requested advanced directives -Provided counseling and plan for difficulty hearing  -Advised and counseled on a healthy  lifestyle  -Reviewed patient's current diet. Advised and counseled on a whole foods based healthy diet. Discussed decreasing sweets, looking for and reducing hidden ultraprocessed foods, replacing juice with water, etc.  A summary of a healthy diet was provided in the Patient Instructions.  -reviewed patient's current physical activity level and discussed exercise guidelines for adults. Congratulated on healthy habits. Encouraged to add balance exercises. Further resources provided in patient instructions.  -Advise yearly dental visits at minimum and regular eye exams -Advised and counseled on alcohol safe limits, risks  Follow up: see patient instructions   Patient Instructions  I really enjoyed getting to talk with you today! I am available on Tuesdays and Thursdays for virtual visits if you have any questions or concerns, or if I can be of any further assistance.   CHECKLIST FROM ANNUAL WELLNESS VISIT:  -Follow up (please call to schedule if not scheduled after visit):   -yearly for annual wellness visit with primary care office  Here is a list of your preventive care/health maintenance measures and the plan for each if any are due:  PLAN For any measures below that may be due:    1. Please bring a copy of your receive vaccine record form the pharmacy so that we can update your record.   Health Maintenance  Topic Date Due   Influenza Vaccine  02/29/2024   COVID-19 Vaccine (9 - Pfizer risk 2025-26 season) 11/03/2024   Medicare Annual Wellness (AWV)  07/03/2025   Colonoscopy  09/22/2025   DTaP/Tdap/Td (3 - Td or Tdap) 12/27/2026   Pneumococcal Vaccine: 50+ Years  Completed   Hepatitis C Screening  Completed   Zoster Vaccines- Shingrix   Completed   Meningococcal B Vaccine  Aged Out    -See a dentist at least yearly  -Get your eyes checked and then per your eye specialist's recommendations  -Other issues addressed today:    1. Please bring a copy of you advanced  directives.   -I have included below further information regarding a healthy whole foods based diet, physical activity guidelines for adults, stress management and opportunities for social connections. I hope you find this information useful.   -----------------------------------------------------------------------------------------------------------------------------------------------------------------------------------------------------------------------------------------------------------    NUTRITION: -eat real food: lots of colorful vegetables (half the plate) and fruits -5-7 servings of vegetables and fruits per day (fresh or steamed is best), exp. 2 servings of vegetables with lunch and dinner and 1-2 servings of fruit per day. Berries and greens such as kale and collards are great choices.  -consume on a regular basis:  fresh veggies, fish, nuts, seeds, healthy oils (such as olive oil, avocado oil), whole grains (make sure for bread/pasta/crackers/etc., that the first ingredient on label contains the word whole), legumes. -can eat small amounts of dairy and lean meat (no larger than the palm of your hand), but avoid processed meats such as ham, bacon, lunch meat, etc. -drink water -try to avoid fast food and pre-packaged foods, processed meat, ultra processed foods/beverages (donuts, candy, etc.) -most experts advise limiting sodium to < 2300mg  per day, should limit further is any chronic conditions such as high blood pressure, heart disease, diabetes, etc. The American Heart Association advised that < 1500mg  is is ideal -try to avoid foods/beverages that contain any ingredients with names you do not recognize  -try to avoid foods/beverages  with added sugar or  sweeteners/sweets  -try to avoid sweet drinks (including diet drinks): soda, juice, Gatorade, sweet tea, power drinks, diet drinks -try to avoid white rice, white bread, pasta (unless whole grain)  EXERCISE GUIDELINES FOR  ADULTS: -if you wish to increase your physical activity, do so gradually and with the approval of your doctor -STOP and seek medical care immediately if you have any chest pain, chest discomfort or trouble breathing when starting or increasing exercise  -move and stretch your body, legs, feet and arms when sitting for long periods -Physical activity guidelines for optimal health in adults: -get at least 150 minutes per week of moderate exercise (can talk, but not sing); this is about 20-30 minutes of sustained activity 5-7 days per week or two 10-15 minute episodes of sustained activity 5-7 days per week -do some muscle building/resistance training/strength training at least 2 days per week  -balance exercises 3+ days per week:   Stand somewhere where you have something sturdy to hold onto if you lose balance    1) lift up on toes, then back down, start with 5x per day and work up to 20x   2) stand and lift one leg straight out to the side so that foot is a few inches of the floor, start with 5x each side and work up to 20x each side   3) stand on one foot, start with 5 seconds each side and work up to 20 seconds on each side  If you need ideas or help with getting more active:  -Silver sneakers https://tools.silversneakers.com  -Walk with a Doc: Http://www.duncan-williams.com/  -try to include resistance (weight lifting/strength building) and balance exercises twice per week: or the following link for ideas: http://castillo-powell.com/  buyducts.dk  STRESS MANAGEMENT: -can try meditating, or just sitting quietly with deep breathing while intentionally relaxing all parts of your body for 5 minutes daily -if you need further help with stress, anxiety or depression please follow up with your primary doctor or contact the wonderful folks at [facility]: [phone]  SOCIAL  CONNECTIONS: -options in [place] if you wish to engage in more social and exercise related activities:  -Silver sneakers https://tools.silversneakers.com  -Walk with a Doc: Http://www.duncan-williams.com/  -Check out the [facility] Active Adults 50+ section on the [place] of [employer] companies (hiking clubs, book clubs, cards and games, chess, exercise classes, aquatic classes and much more) - see the website for details: https://www.[place].gov/departments/parks-recreation/active-adults50  -YouTube has lots of exercise videos for different ages and abilities as well  -[facility] (a variety of indoor and outdoor inperson activities for adults). [phone]. [address].  -Virtual Online Classes (a variety of topics): see seniorplanet.org or call [phone]  -consider volunteering at a school, hospice center, church, senior center or elsewhere            Chiquita JONELLE Cramp, DO

## 2024-07-03 NOTE — Patient Instructions (Signed)
 I really enjoyed getting to talk with you today! I am available on Tuesdays and Thursdays for virtual visits if you have any questions or concerns, or if I can be of any further assistance.   CHECKLIST FROM ANNUAL WELLNESS VISIT:  -Follow up (please call to schedule if not scheduled after visit):   -yearly for annual wellness visit with primary care office  Here is a list of your preventive care/health maintenance measures and the plan for each if any are due:  PLAN For any measures below that may be due:    1. Please bring a copy of your receive vaccine record form the pharmacy so that we can update your record.   Health Maintenance  Topic Date Due   Influenza Vaccine  02/29/2024   COVID-19 Vaccine (9 - Pfizer risk 2025-26 season) 11/03/2024   Medicare Annual Wellness (AWV)  07/03/2025   Colonoscopy  09/22/2025   DTaP/Tdap/Td (3 - Td or Tdap) 12/27/2026   Pneumococcal Vaccine: 50+ Years  Completed   Hepatitis C Screening  Completed   Zoster Vaccines- Shingrix   Completed   Meningococcal B Vaccine  Aged Out    -See a dentist at least yearly  -Get your eyes checked and then per your eye specialist's recommendations  -Other issues addressed today:    1. Please bring a copy of you advanced directives.   -I have included below further information regarding a healthy whole foods based diet, physical activity guidelines for adults, stress management and opportunities for social connections. I hope you find this information useful.   -----------------------------------------------------------------------------------------------------------------------------------------------------------------------------------------------------------------------------------------------------------    NUTRITION: -eat real food: lots of colorful vegetables (half the plate) and fruits -5-7 servings of vegetables and fruits per day (fresh or steamed is best), exp. 2 servings of vegetables with lunch  and dinner and 1-2 servings of fruit per day. Berries and greens such as kale and collards are great choices.  -consume on a regular basis:  fresh veggies, fish, nuts, seeds, healthy oils (such as olive oil, avocado oil), whole grains (make sure for bread/pasta/crackers/etc., that the first ingredient on label contains the word whole), legumes. -can eat small amounts of dairy and lean meat (no larger than the palm of your hand), but avoid processed meats such as ham, bacon, lunch meat, etc. -drink water -try to avoid fast food and pre-packaged foods, processed meat, ultra processed foods/beverages (donuts, candy, etc.) -most experts advise limiting sodium to < 2300mg  per day, should limit further is any chronic conditions such as high blood pressure, heart disease, diabetes, etc. The American Heart Association advised that < 1500mg  is is ideal -try to avoid foods/beverages that contain any ingredients with names you do not recognize  -try to avoid foods/beverages  with added sugar or sweeteners/sweets  -try to avoid sweet drinks (including diet drinks): soda, juice, Gatorade, sweet tea, power drinks, diet drinks -try to avoid white rice, white bread, pasta (unless whole grain)  EXERCISE GUIDELINES FOR ADULTS: -if you wish to increase your physical activity, do so gradually and with the approval of your doctor -STOP and seek medical care immediately if you have any chest pain, chest discomfort or trouble breathing when starting or increasing exercise  -move and stretch your body, legs, feet and arms when sitting for long periods -Physical activity guidelines for optimal health in adults: -get at least 150 minutes per week of moderate exercise (can talk, but not sing); this is about 20-30 minutes of sustained activity 5-7 days per week or two 10-15 minute  episodes of sustained activity 5-7 days per week -do some muscle building/resistance training/strength training at least 2 days per week   -balance exercises 3+ days per week:   Stand somewhere where you have something sturdy to hold onto if you lose balance    1) lift up on toes, then back down, start with 5x per day and work up to 20x   2) stand and lift one leg straight out to the side so that foot is a few inches of the floor, start with 5x each side and work up to 20x each side   3) stand on one foot, start with 5 seconds each side and work up to 20 seconds on each side  If you need ideas or help with getting more active:  -Silver sneakers https://tools.silversneakers.com  -Walk with a Doc: Http://www.duncan-williams.com/  -try to include resistance (weight lifting/strength building) and balance exercises twice per week: or the following link for ideas: http://castillo-powell.com/  buyducts.dk  STRESS MANAGEMENT: -can try meditating, or just sitting quietly with deep breathing while intentionally relaxing all parts of your body for 5 minutes daily -if you need further help with stress, anxiety or depression please follow up with your primary doctor or contact the wonderful folks at [facility]: [phone]  SOCIAL CONNECTIONS: -options in [place] if you wish to engage in more social and exercise related activities:  -Silver sneakers https://tools.silversneakers.com  -Walk with a Doc: Http://www.duncan-williams.com/  -Check out the [facility] Active Adults 50+ section on the [place] [employer] companies (hiking clubs, book clubs, cards and games, chess, exercise classes, aquatic classes and much more) - see the website for details: https://www.[place].gov/departments/parks-recreation/active-adults50  -YouTube has lots of exercise videos for different ages and abilities as well  -[facility] (a variety of indoor and outdoor inperson activities for adults). [phone]. [address]  -Virtual Online Classes (a variety of topics): see seniorplanet.org or call [phone]  -consider volunteering at a school, hospice center, church, senior center or elsewhere

## 2024-07-04 MED ORDER — PRAMIPEXOLE DIHYDROCHLORIDE 0.25 MG PO TABS: ORAL_TABLET | ORAL | 11 refills | Status: DC

## 2024-07-10 ENCOUNTER — Encounter: Payer: Self-pay | Admitting: Family Medicine

## 2024-07-10 DIAGNOSIS — R399 Unspecified symptoms and signs involving the genitourinary system: Secondary | ICD-10-CM

## 2024-07-10 DIAGNOSIS — N529 Male erectile dysfunction, unspecified: Secondary | ICD-10-CM

## 2024-07-10 MED ORDER — ATORVASTATIN CALCIUM 80 MG PO TABS: 80.0000 mg | ORAL_TABLET | Freq: Every day | ORAL | 3 refills | Status: AC

## 2024-07-10 MED ORDER — LOSARTAN POTASSIUM 100 MG PO TABS: 100.0000 mg | ORAL_TABLET | Freq: Every day | ORAL | 3 refills | Status: AC

## 2024-07-10 MED ORDER — ALLOPURINOL 300 MG PO TABS: 300.0000 mg | ORAL_TABLET | Freq: Every day | ORAL | 3 refills | Status: AC

## 2024-07-11 MED ORDER — TADALAFIL 5 MG PO TABS: 10.0000 mg | ORAL_TABLET | Freq: Every day | ORAL | 11 refills | Status: DC

## 2024-07-14 ENCOUNTER — Other Ambulatory Visit (INDEPENDENT_AMBULATORY_CARE_PROVIDER_SITE_OTHER)

## 2024-07-14 DIAGNOSIS — E66811 Obesity, class 1: Secondary | ICD-10-CM

## 2024-07-14 MED ORDER — TIRZEPATIDE-WEIGHT MANAGEMENT 5 MG/0.5ML ~~LOC~~ SOLN: 5.0000 mg | SUBCUTANEOUS | 0 refills | Status: DC

## 2024-07-14 NOTE — Progress Notes (Signed)
° °  07/14/2024  Patient ID: Paul Morrison, male   DOB: [birth year], 68 y.o.   MRN: [id number]  Contacted patient via telephone to follow up on Zepbound  use.  Patient has given himself 3 doses of Zepbound  2.5mg , injecting on Saturdays.   Has had no issues with side effects. Did move recently and is waiting on a new scale to arrive, so unable to report weight today. Would like to go up to 5mg .  Will send in order for PCP co-sign  Follow Up: 4 weeks but patient aware to reach out sooner with questions/concerns  Jon VEAR Lindau, PharmD Clinical Pharmacist [phone]

## 2024-07-15 ENCOUNTER — Other Ambulatory Visit: Payer: Self-pay | Admitting: Family Medicine

## 2024-08-05 ENCOUNTER — Ambulatory Visit: Payer: Self-pay | Admitting: Family Medicine

## 2024-08-05 ENCOUNTER — Other Ambulatory Visit (INDEPENDENT_AMBULATORY_CARE_PROVIDER_SITE_OTHER)

## 2024-08-05 DIAGNOSIS — E782 Mixed hyperlipidemia: Secondary | ICD-10-CM | POA: Diagnosis not present

## 2024-08-05 DIAGNOSIS — Z125 Encounter for screening for malignant neoplasm of prostate: Secondary | ICD-10-CM

## 2024-08-05 DIAGNOSIS — R7303 Prediabetes: Secondary | ICD-10-CM

## 2024-08-05 DIAGNOSIS — E611 Iron deficiency: Secondary | ICD-10-CM | POA: Diagnosis not present

## 2024-08-05 LAB — COMPREHENSIVE METABOLIC PANEL WITH GFR
ALT: 35 U/L (ref 3–53)
AST: 26 U/L (ref 5–37)
Albumin: 4.7 g/dL (ref 3.5–5.2)
Alkaline Phosphatase: 43 U/L (ref 39–117)
BUN: 19 mg/dL (ref 6–23)
CO2: 24 meq/L (ref 19–32)
Calcium: 9.6 mg/dL (ref 8.4–10.5)
Chloride: 103 meq/L (ref 96–112)
Creatinine, Ser: 1 mg/dL (ref 0.40–1.50)
GFR: 77.86 mL/min
Glucose, Bld: 101 mg/dL — ABNORMAL HIGH (ref 70–99)
Potassium: 4.1 meq/L (ref 3.5–5.1)
Sodium: 136 meq/L (ref 135–145)
Total Bilirubin: 0.7 mg/dL (ref 0.2–1.2)
Total Protein: 6.6 g/dL (ref 6.0–8.3)

## 2024-08-05 LAB — LIPID PANEL
Cholesterol: 159 mg/dL (ref 28–200)
HDL: 45.8 mg/dL
LDL Cholesterol: 90 mg/dL (ref 10–99)
NonHDL: 112.95
Total CHOL/HDL Ratio: 3
Triglycerides: 117 mg/dL (ref 10.0–149.0)
VLDL: 23.4 mg/dL (ref 0.0–40.0)

## 2024-08-05 LAB — PSA, MEDICARE: PSA: 1.86 ng/mL (ref 0.10–4.00)

## 2024-08-05 LAB — HEMOGLOBIN A1C: Hgb A1c MFr Bld: 5.5 % (ref 4.6–6.5)

## 2024-08-06 LAB — IRON,TIBC AND FERRITIN PANEL
%SAT: 25 % (ref 20–48)
Ferritin: 230 ng/mL (ref 24–380)
Iron: 92 ug/dL (ref 50–180)
TIBC: 363 ug/dL (ref 250–425)

## 2024-08-08 ENCOUNTER — Encounter: Payer: Self-pay | Admitting: Family Medicine

## 2024-08-11 ENCOUNTER — Other Ambulatory Visit (INDEPENDENT_AMBULATORY_CARE_PROVIDER_SITE_OTHER)

## 2024-08-11 ENCOUNTER — Other Ambulatory Visit: Payer: Self-pay | Admitting: Family Medicine

## 2024-08-11 VITALS — Wt 253.0 lb

## 2024-08-11 DIAGNOSIS — E66811 Obesity, class 1: Secondary | ICD-10-CM

## 2024-08-11 MED ORDER — TIRZEPATIDE-WEIGHT MANAGEMENT 7.5 MG/0.5ML ~~LOC~~ SOLN: 7.5000 mg | SUBCUTANEOUS | 0 refills | Status: DC

## 2024-08-11 MED ORDER — TIRZEPATIDE-WEIGHT MANAGEMENT 5 MG/0.5ML ~~LOC~~ SOLN: 5.0000 mg | SUBCUTANEOUS | 0 refills | Status: DC

## 2024-08-11 NOTE — Addendum Note (Signed)
 Addended by: LIONELL JON DEL on: 08/11/2024 09:09 PM   Modules accepted: Orders

## 2024-08-11 NOTE — Progress Notes (Signed)
" ° °  08/11/2024  Patient ID: Paul Morrison, male   DOB: 1956-06-27, 69 y.o.   MRN: [id number]  Contacted patient via telephone to follow up on Zepbound  use.  Patient has given himself 3 doses of Zepbound  5mg , injecting on Thursdays  Has had no issues with side effects. Weight is 253 lbs as of call today.   Would like to go up to the 7.5mg  dose, will coordinate with PCP.  Follow Up: 4 weeks but patient aware to reach out sooner with questions/concerns  Jon VEAR Lindau, PharmD Clinical Pharmacist [phone]  "

## 2024-08-13 ENCOUNTER — Telehealth: Payer: Self-pay

## 2024-08-13 ENCOUNTER — Other Ambulatory Visit (HOSPITAL_COMMUNITY): Payer: Self-pay

## 2024-08-13 NOTE — Telephone Encounter (Signed)
 Pharmacy Patient Advocate Encounter   Received notification from [facility] that prior authorization for Indomethacin  50 caps is required/requested.   Insurance verification completed.   The patient is insured through [employer] [facility].   Per test claim: Per test claim, medication is not covered due to plan/benefit exclusion, PA not submitted at this time

## 2024-08-15 ENCOUNTER — Encounter: Payer: Self-pay | Admitting: Family Medicine

## 2024-08-19 ENCOUNTER — Encounter: Payer: Self-pay | Admitting: Family Medicine

## 2024-08-19 ENCOUNTER — Ambulatory Visit: Admitting: Family Medicine

## 2024-08-19 VITALS — BP 124/80 | HR 71 | Temp 97.6°F | Wt 259.9 lb

## 2024-08-19 DIAGNOSIS — E782 Mixed hyperlipidemia: Secondary | ICD-10-CM

## 2024-08-19 DIAGNOSIS — G2581 Restless legs syndrome: Secondary | ICD-10-CM

## 2024-08-19 DIAGNOSIS — N400 Enlarged prostate without lower urinary tract symptoms: Secondary | ICD-10-CM | POA: Diagnosis not present

## 2024-08-19 DIAGNOSIS — I10 Essential (primary) hypertension: Secondary | ICD-10-CM | POA: Diagnosis not present

## 2024-08-19 MED ORDER — TADALAFIL 5 MG PO TABS: 10.0000 mg | ORAL_TABLET | Freq: Every day | ORAL | 1 refills | Status: AC

## 2024-08-19 MED ORDER — PRAMIPEXOLE DIHYDROCHLORIDE 0.125 MG PO TABS: ORAL_TABLET | ORAL | 5 refills | Status: AC

## 2024-08-19 NOTE — Patient Instructions (Signed)
 Monitor blood pressure and would back down Losartan  dose if systolic/top number consistently < 100.

## 2024-08-19 NOTE — Progress Notes (Signed)
 "  Established Patient Office Visit  Subjective   Patient ID: Paul Morrison, male    DOB: 08/05/1955  Age: 69 y.o. MRN: [id number]  Chief Complaint  Patient presents with   Medication Consultation    HPI    Marolyn had sent in a message recently with several questions.  He had sent an message to discuss possible medication changes.  He recently went on GLP-1 with tirzepatide  and has lost close to 20 pounds.  Feeling much better overall.  He had several blood pressure readings 120 systolic and 1 evening after strenuous workout was in the hot tub for several minutes and afterwards blood pressure was 110/70 felt slightly dizzy.  He had questions regarding whether he should reduce doses of losartan .  He is not having consistent symptoms of dizziness.  He takes statin and had questions regarding whether he should lower his statin dose in view of his weight loss.  Recent LDL cholesterol 90.  No myalgias.  He has restless leg syndrome.  Currently on low-dose Mirapex  0.25 mg and recently has been cutting this in half.  He is requiring less medication for symptom control and is inquiring about getting a lower dose such as 0.125 mg.  He has history of BPH and his urologist had placed him on Cialis  5 mg 2 daily sometime back and he is requesting refills.  No current obstructive urinary symptoms  Past Medical History:  Diagnosis Date   Arthritis    Cancer (HCC)    skin cancer left ear- MOHS   Hyperlipidemia    Hypertension    Low back pain    Past Surgical History:  Procedure Laterality Date   COLONOSCOPY     HERNIA REPAIR     INJECTION KNEE     both knees   keratatomy     knee arthroscopy     LUMBAR FUSION     MOHS SURGERY     POLYPECTOMY     TOTAL KNEE ARTHROPLASTY Right 11/24/2016   Procedure: RIGHT TOTAL KNEE ARTHROPLASTY;  Surgeon: Lamar Collet, MD;  Location: [facility];  Service: Orthopedics;  Laterality: Right;   VASECTOMY      reports that he has never smoked. He has never  used smokeless tobacco. He reports current alcohol use. He reports that he does not use drugs. family history includes Aneurysm in an other family member; Asthma in his mother; Coronary artery disease in an other family member; Hyperlipidemia in his mother; Hypertension in his mother. Allergies[1]  Review of Systems  Constitutional:  Negative for malaise/fatigue.  Eyes:  Negative for blurred vision.  Respiratory:  Negative for shortness of breath.   Cardiovascular:  Negative for chest pain.  Neurological:  Negative for dizziness, weakness and headaches.      Objective:     BP 124/80   Pulse 71   Temp 97.6 F (36.4 C) (Oral)   Wt 259 lb 14.4 oz (117.9 kg)   SpO2 96%   BMI 33.37 kg/m  BP Readings from Last 3 Encounters:  08/19/24 124/80  06/11/24 120/64  12/13/23 118/76   Wt Readings from Last 3 Encounters:  08/19/24 259 lb 14.4 oz (117.9 kg)  08/11/24 253 lb (114.8 kg)  06/11/24 272 lb 11.2 oz (123.7 kg)      Physical Exam Vitals reviewed.  Constitutional:      General: He is not in acute distress.    Appearance: He is not ill-appearing.  Cardiovascular:     Rate and Rhythm: Normal  rate and regular rhythm.  Pulmonary:     Effort: Pulmonary effort is normal.     Breath sounds: Normal breath sounds. No wheezing or rales.  Neurological:     Mental Status: He is alert.      No results found for any visits on 08/19/24.  Last CBC Lab Results  Component Value Date   WBC 6.7 07/03/2023   HGB 16.4 07/03/2023   HCT 49.5 07/03/2023   MCV 97.3 07/03/2023   MCH 31.8 04/12/2021   RDW 13.9 07/03/2023   PLT 166.0 07/03/2023   Last metabolic panel Lab Results  Component Value Date   GLUCOSE 101 (H) 08/05/2024   NA 136 08/05/2024   K 4.1 08/05/2024   CL 103 08/05/2024   CO2 24 08/05/2024   BUN 19 08/05/2024   CREATININE 1.00 08/05/2024   GFR 77.86 08/05/2024   CALCIUM  9.6 08/05/2024   PROT 6.6 08/05/2024   ALBUMIN  4.7 08/05/2024   BILITOT 0.7 08/05/2024    ALKPHOS 43 08/05/2024   AST 26 08/05/2024   ALT 35 08/05/2024   ANIONGAP 11 04/12/2021   Last lipids Lab Results  Component Value Date   CHOL 159 08/05/2024   HDL 45.80 08/05/2024   LDLCALC 90 08/05/2024   LDLDIRECT 116.0 05/03/2022   TRIG 117.0 08/05/2024   CHOLHDL 3 08/05/2024   Last hemoglobin A1c Lab Results  Component Value Date   HGBA1C 5.5 08/05/2024   Last thyroid  functions Lab Results  Component Value Date   TSH 2.450 06/28/2022      The 10-year ASCVD risk score (Arnett DK, et al., 2019) is: 14.7%    Assessment & Plan:   #1 hypertension.  Currently well-controlled.  Improved with recent weight loss.  Would not reduce dosage of losartan  at this time.  He had 1 episode of dizziness and lower blood pressure after hot tub use but has not had consistent orthostatic symptoms.  Would back off losartan  dosage if systolic readings consistently less than 100  #2 hyperlipidemia.  Recent LDL cholesterol 90.  He had questions regarding whether he should reduce his statin dose and we explained that we would not recommend changing statin in the absence of any new symptoms such as increased myalgias  #3 restless leg symptoms.  Currently stable on very low-dose Mirapex .  Patient requesting lower dose of Mirapex  to 0.125 mg and this was written.  He has been still taking iron supplement and had recent iron studies which were normal we recommended stopping over-the-counter iron at this time  #4 BPH.  Patient takes Cialis  5 mg 2 tablets daily and refill provided.  No current obstructive urinary symptoms.     No follow-ups on file.    Wolm Scarlet, MD     [1]  Allergies Allergen Reactions   Cephalexin  Other (See Comments)    Stomach cramping    Celecoxib Rash   "

## 2024-08-20 ENCOUNTER — Other Ambulatory Visit (HOSPITAL_COMMUNITY): Payer: Self-pay

## 2024-08-22 ENCOUNTER — Other Ambulatory Visit: Payer: Self-pay | Admitting: Family Medicine

## 2024-09-02 ENCOUNTER — Other Ambulatory Visit: Payer: Self-pay | Admitting: Family Medicine

## 2024-09-08 ENCOUNTER — Other Ambulatory Visit
